# Patient Record
Sex: Male | Born: 1965 | Race: Black or African American | Hispanic: No | Marital: Single | Smoking: Never smoker
Health system: Southern US, Community
[De-identification: ages and names within clinical notes are randomized; demographics above are authoritative.]

## PROBLEM LIST (undated history)

## (undated) ENCOUNTER — Emergency Department (HOSPITAL_COMMUNITY): Payer: No Typology Code available for payment source | Source: Home / Self Care

## (undated) DIAGNOSIS — Z9109 Other allergy status, other than to drugs and biological substances: Secondary | ICD-10-CM

## (undated) DIAGNOSIS — E7800 Pure hypercholesterolemia, unspecified: Secondary | ICD-10-CM

## (undated) DIAGNOSIS — I10 Essential (primary) hypertension: Secondary | ICD-10-CM

## (undated) DIAGNOSIS — E119 Type 2 diabetes mellitus without complications: Secondary | ICD-10-CM

## (undated) DIAGNOSIS — C801 Malignant (primary) neoplasm, unspecified: Secondary | ICD-10-CM

## (undated) DIAGNOSIS — K259 Gastric ulcer, unspecified as acute or chronic, without hemorrhage or perforation: Secondary | ICD-10-CM

## (undated) HISTORY — PX: COLON RESECTION: SHX5231

## (undated) HISTORY — PX: SPINE SURGERY: SHX786

## (undated) HISTORY — PX: HAND SURGERY: SHX662

## (undated) HISTORY — PX: CERVICAL LAMINECTOMY: SHX94

## (undated) HISTORY — PX: ACHILLES TENDON SURGERY: SHX542

## (undated) HISTORY — PX: SHOULDER ARTHROSCOPY: SHX128

---

## 2001-08-26 ENCOUNTER — Emergency Department (HOSPITAL_COMMUNITY): Admission: EM | Admit: 2001-08-26 | Discharge: 2001-08-26 | Payer: Self-pay | Admitting: Emergency Medicine

## 2004-07-10 ENCOUNTER — Emergency Department (HOSPITAL_COMMUNITY): Admission: EM | Admit: 2004-07-10 | Discharge: 2004-07-10 | Payer: Self-pay | Admitting: *Deleted

## 2005-05-25 ENCOUNTER — Emergency Department (HOSPITAL_COMMUNITY): Admission: EM | Admit: 2005-05-25 | Discharge: 2005-05-25 | Payer: Self-pay | Admitting: Emergency Medicine

## 2005-06-06 ENCOUNTER — Emergency Department (HOSPITAL_COMMUNITY): Admission: EM | Admit: 2005-06-06 | Discharge: 2005-06-06 | Payer: Self-pay | Admitting: Emergency Medicine

## 2005-06-18 ENCOUNTER — Ambulatory Visit: Payer: Self-pay | Admitting: Family Medicine

## 2005-06-20 ENCOUNTER — Ambulatory Visit (HOSPITAL_COMMUNITY): Admission: RE | Admit: 2005-06-20 | Discharge: 2005-06-20 | Payer: Self-pay | Admitting: Family Medicine

## 2005-06-24 ENCOUNTER — Encounter (HOSPITAL_COMMUNITY): Admission: RE | Admit: 2005-06-24 | Discharge: 2005-07-24 | Payer: Self-pay | Admitting: Family Medicine

## 2005-07-23 ENCOUNTER — Ambulatory Visit: Payer: Self-pay | Admitting: Orthopedic Surgery

## 2005-07-25 ENCOUNTER — Encounter (HOSPITAL_COMMUNITY): Admission: RE | Admit: 2005-07-25 | Discharge: 2005-08-24 | Payer: Self-pay | Admitting: Family Medicine

## 2005-08-12 ENCOUNTER — Ambulatory Visit: Payer: Self-pay | Admitting: Family Medicine

## 2005-08-25 ENCOUNTER — Encounter (HOSPITAL_COMMUNITY): Admission: RE | Admit: 2005-08-25 | Discharge: 2005-09-24 | Payer: Self-pay | Admitting: Family Medicine

## 2005-10-24 ENCOUNTER — Ambulatory Visit: Payer: Self-pay | Admitting: Family Medicine

## 2005-10-30 ENCOUNTER — Encounter (HOSPITAL_COMMUNITY): Admission: RE | Admit: 2005-10-30 | Discharge: 2005-11-29 | Payer: Self-pay | Admitting: Neurosurgery

## 2005-11-26 ENCOUNTER — Ambulatory Visit: Payer: Self-pay | Admitting: Orthopedic Surgery

## 2005-12-03 ENCOUNTER — Encounter (HOSPITAL_COMMUNITY): Admission: RE | Admit: 2005-12-03 | Discharge: 2005-12-06 | Payer: Self-pay | Admitting: Neurosurgery

## 2005-12-09 ENCOUNTER — Encounter (HOSPITAL_COMMUNITY): Admission: RE | Admit: 2005-12-09 | Discharge: 2006-01-08 | Payer: Self-pay | Admitting: Neurosurgery

## 2006-01-07 ENCOUNTER — Ambulatory Visit: Payer: Self-pay | Admitting: Orthopedic Surgery

## 2006-01-22 ENCOUNTER — Encounter (HOSPITAL_COMMUNITY): Admission: RE | Admit: 2006-01-22 | Discharge: 2006-02-21 | Payer: Self-pay | Admitting: Orthopedic Surgery

## 2006-03-05 ENCOUNTER — Encounter (HOSPITAL_COMMUNITY): Admission: RE | Admit: 2006-03-05 | Discharge: 2006-03-09 | Payer: Self-pay | Admitting: Orthopedic Surgery

## 2006-03-11 ENCOUNTER — Encounter (HOSPITAL_COMMUNITY): Admission: RE | Admit: 2006-03-11 | Discharge: 2006-04-10 | Payer: Self-pay | Admitting: Orthopedic Surgery

## 2006-04-14 ENCOUNTER — Encounter (HOSPITAL_COMMUNITY): Admission: RE | Admit: 2006-04-14 | Discharge: 2006-05-14 | Payer: Self-pay | Admitting: Orthopedic Surgery

## 2006-07-23 ENCOUNTER — Ambulatory Visit (HOSPITAL_COMMUNITY): Admission: RE | Admit: 2006-07-23 | Discharge: 2006-07-23 | Payer: Self-pay | Admitting: Neurosurgery

## 2006-09-16 ENCOUNTER — Inpatient Hospital Stay (HOSPITAL_COMMUNITY): Admission: RE | Admit: 2006-09-16 | Discharge: 2006-09-17 | Payer: Self-pay | Admitting: Neurosurgery

## 2006-11-14 ENCOUNTER — Emergency Department (HOSPITAL_COMMUNITY): Admission: EM | Admit: 2006-11-14 | Discharge: 2006-11-14 | Payer: Self-pay | Admitting: Emergency Medicine

## 2006-11-17 ENCOUNTER — Encounter (HOSPITAL_COMMUNITY): Admission: RE | Admit: 2006-11-17 | Discharge: 2006-12-08 | Payer: Self-pay | Admitting: Neurosurgery

## 2006-11-28 ENCOUNTER — Emergency Department (HOSPITAL_COMMUNITY): Admission: EM | Admit: 2006-11-28 | Discharge: 2006-11-28 | Payer: Self-pay | Admitting: Emergency Medicine

## 2006-12-28 ENCOUNTER — Encounter: Payer: Self-pay | Admitting: Orthopedic Surgery

## 2007-05-17 ENCOUNTER — Ambulatory Visit: Payer: Self-pay | Admitting: Family Medicine

## 2007-06-28 ENCOUNTER — Encounter: Payer: Self-pay | Admitting: Orthopedic Surgery

## 2007-06-29 ENCOUNTER — Encounter: Payer: Self-pay | Admitting: Orthopedic Surgery

## 2007-07-07 ENCOUNTER — Ambulatory Visit: Payer: Self-pay | Admitting: Family Medicine

## 2008-02-18 ENCOUNTER — Encounter: Payer: Self-pay | Admitting: Family Medicine

## 2008-08-14 ENCOUNTER — Ambulatory Visit: Payer: Self-pay | Admitting: Family Medicine

## 2008-08-14 DIAGNOSIS — M542 Cervicalgia: Secondary | ICD-10-CM | POA: Insufficient documentation

## 2008-09-11 ENCOUNTER — Emergency Department (HOSPITAL_COMMUNITY): Admission: EM | Admit: 2008-09-11 | Discharge: 2008-09-11 | Payer: Self-pay | Admitting: Emergency Medicine

## 2008-09-18 ENCOUNTER — Ambulatory Visit: Payer: Self-pay | Admitting: Family Medicine

## 2008-10-10 ENCOUNTER — Encounter: Payer: Self-pay | Admitting: Orthopedic Surgery

## 2008-10-10 ENCOUNTER — Encounter: Payer: Self-pay | Admitting: Family Medicine

## 2008-10-13 ENCOUNTER — Encounter: Payer: Self-pay | Admitting: Family Medicine

## 2008-11-22 ENCOUNTER — Ambulatory Visit: Payer: Self-pay | Admitting: Orthopedic Surgery

## 2008-11-22 DIAGNOSIS — M65849 Other synovitis and tenosynovitis, unspecified hand: Secondary | ICD-10-CM

## 2008-11-22 DIAGNOSIS — G589 Mononeuropathy, unspecified: Secondary | ICD-10-CM | POA: Insufficient documentation

## 2008-11-22 DIAGNOSIS — M5412 Radiculopathy, cervical region: Secondary | ICD-10-CM | POA: Insufficient documentation

## 2008-11-22 DIAGNOSIS — M65839 Other synovitis and tenosynovitis, unspecified forearm: Secondary | ICD-10-CM

## 2008-11-23 ENCOUNTER — Telehealth: Payer: Self-pay | Admitting: Orthopedic Surgery

## 2008-11-27 ENCOUNTER — Ambulatory Visit (HOSPITAL_COMMUNITY): Admission: RE | Admit: 2008-11-27 | Discharge: 2008-11-27 | Payer: Self-pay | Admitting: Orthopedic Surgery

## 2008-11-27 ENCOUNTER — Encounter: Payer: Self-pay | Admitting: Orthopedic Surgery

## 2009-01-09 ENCOUNTER — Encounter: Payer: Self-pay | Admitting: Family Medicine

## 2009-02-07 ENCOUNTER — Encounter: Payer: Self-pay | Admitting: Orthopedic Surgery

## 2009-02-09 ENCOUNTER — Encounter: Payer: Self-pay | Admitting: Family Medicine

## 2009-02-14 ENCOUNTER — Encounter (INDEPENDENT_AMBULATORY_CARE_PROVIDER_SITE_OTHER): Payer: Self-pay | Admitting: *Deleted

## 2009-02-14 ENCOUNTER — Telehealth: Payer: Self-pay | Admitting: Orthopedic Surgery

## 2009-02-14 ENCOUNTER — Encounter: Payer: Self-pay | Admitting: Orthopedic Surgery

## 2009-02-14 DIAGNOSIS — Z8669 Personal history of other diseases of the nervous system and sense organs: Secondary | ICD-10-CM | POA: Insufficient documentation

## 2009-02-15 ENCOUNTER — Telehealth: Payer: Self-pay | Admitting: Orthopedic Surgery

## 2010-04-11 NOTE — Consult Note (Signed)
Summary: [facility] consult Dr Letitia Libra consult Dr Franky Macho   Imported By: Cammie Sickle 06/30/2009 12:52:50  _____________________________________________________________________  External Attachment:    Type:   Image     Comment:   External Document

## 2010-04-11 NOTE — Letter (Signed)
Summary: Medical record request Attorney Hillery Jacks  Medical record request Attorney Hillery Jacks   Imported By: Cammie Sickle 04/18/2009 20:10:16  _____________________________________________________________________  External Attachment:    Type:   Image     Comment:   External Document

## 2010-07-23 NOTE — Op Note (Signed)
Joseph Pruitt, Joseph Pruitt                 ACCOUNT NO.:  [id number]   MEDICAL RECORD NO.:  [id number]          PATIENT TYPE:  INP   LOCATION:  5152                         FACILITY:  [facility]   PHYSICIAN:  Coletta Memos, M.D.     DATE OF BIRTH:  December 20, 1965   DATE OF PROCEDURE:  09/16/2006  DATE OF DISCHARGE:                               OPERATIVE REPORT   PREOPERATIVE DIAGNOSIS:  Cervical spondylosis, cervical radiculopathy C5-  6, C6-7 and neck pain.   POSTOPERATIVE DIAGNOSIS:  Cervical spondylosis, cervical radiculopathy  C5-6, C6-7 and neck pain.   PROCEDURE:  1. Anterior cervical decompression C5-6, C6-7.  2. Arthrodesis C5-6, C6-7 with 7-mm structural allograft x 2.  3. Anterior instrumentation 34 mm vector plate with 14 mm screws.   COMPLICATIONS:  None.   SURGEON:  Coletta Memos, M.D.   ASSISTANT:  Payton Doughty, M.D.   INDICATIONS:  Mr. Joseph Pruitt is a gentleman whom I have taken care of  now for approximately a year.  He presented initially August 2007 with  neck and shoulder pain.  At that time he had a herniated disk on the  left side at C5-6.  He was able to do well with conservative treatment.  He reappeared at the office on 07/16/2006 and at that time he had  significant pain in the left side of his neck which had gotten worse  over the last few weeks.  And this was not the same pain which he had  been having when he first saw me.  MRI showed significant amount of soft  tissue and an osteophyte at C5-6 on the left side, foraminal narrowing,  canal narrowing at 5-6 and 6-7.  Cord signal was normal.  I therefore  recommended he agreed to undergo operative decompression and  arthrodesis.   OPERATIVE NOTE:  Mr. Joseph Pruitt was brought to the operating room intubated  and placed under a general anesthetic without difficulty.  His head was  positioned on horseshoe headrest essentially in neutral position.  His  neck was prepped and he was draped in a sterile fashion.  I infiltrated  4  mL of half percent lidocaine 1:200,000 strength epinephrine starting  from the midline extending to the medial border of the left  sternocleidomastoid at the level of the cricoid cartilage.  I opened the  skin with a #10 blade and I took my initial incision down to the level  of the platysma.  Using Metzenbaum scissors, I dissected rostrally and  caudally in a plane above the platysma.  I then opened the platysma  horizontal fashion using the same Metzenbaum scissors.  I then dissected  rostrally and caudally in a plane inferior to the platysma.  Sternocleidomastoid easily visible and the medial strap muscles also.  I  then with Metzenbaum scissors and using both blunt and sharp dissection  created an avascular plane through the sternocleidomastoid strap  muscles.  The carotid artery was retracted laterally strap muscles and  esophagus retracted medially.  I placed a spinal needle which was on x-  ray shown to be at C5-6.  Using that as a guide I then reflected the  longus colli muscles bilaterally, placed a self-retaining retractor and  proceeded to open the disk spaces at both C5-6 and C6-7.  I did some  initial curetting of both disk spaces and removed disk material.  At  this point I then placed distraction pins one at C6, the other at C7 and  distracted the space.  I then proceeded with the diskectomy and  decompression.   I performed the diskectomy and decompression of the spinal canal at C6-7  using curettes, high-speed drill and Kerrison punches along with  pituitary rongeurs.  He had very thickened posterior longitudinal  ligament and osteophytes present.  The drill was able to remove  osteophytes and I used a Kerrison punch to fully decompressed both C7  nerve roots along with the spinal canal.  With that being done I then  turned my attention to the arthrodesis.  I used a drill to again remove  endplate and soft tissue from the bony surfaces.  I excised the space  and placed a  7-mm structural allograft into the space.  I then removed  the distraction pin at C7 and placed that at C5.   I performed my decompression of the spinal canal and C6 nerve roots at  C5-6 using the same technique.  I used curettes, a high-speed drill,  Kerrison punches and pituitary rongeurs.  He had a very large osteophyte  at C5-6 coming off the inferior portion of the C5 vertebral body.  He  also had a great deal of soft tissue and disk material on the left-sided  neural foramen which I removed.  I was able to fully decompress the C6  nerve roots bilaterally.  Once that was done I again prepared for  arthrodesis.  I placed a 7 mm graft after using the drill to remove soft  tissue from the endplates at C5 and at C6.  There was free egress of  both nerve roots laterally.  After placing the graft, I prepared for  anterior instrumentation.   With Dr. Temple Pacini assistance, I placed a 34 mm plate, putting two screws  into C5, two in C6, two in C7.  They were  self-tapping screws.  X-ray  showed the plate, plugs and screws to all be in good position.  I then  irrigated the wound.  I cauterized some small bleeding areas.  With  hemostasis, I then closed using Vicryl sutures to reapproximate the  platysma and subcuticular layers.  Dermabond used for sterile dressing.           ______________________________  Coletta Memos, M.D.     KC/MEDQ  D:  09/16/2006  T:  09/17/2006  Job:  [id number]

## 2010-08-27 ENCOUNTER — Encounter: Payer: Self-pay | Admitting: Family Medicine

## 2010-08-29 ENCOUNTER — Ambulatory Visit: Payer: Self-pay | Admitting: Family Medicine

## 2010-08-29 ENCOUNTER — Encounter: Payer: Self-pay | Admitting: Family Medicine

## 2010-12-04 DIAGNOSIS — M6788 Other specified disorders of synovium and tendon, other site: Secondary | ICD-10-CM | POA: Insufficient documentation

## 2010-12-24 LAB — BASIC METABOLIC PANEL
BUN: 8
Calcium: 9.2
Chloride: 102
GFR calc Af Amer: >60
Glucose, Bld: 104 — ABNORMAL HIGH
Potassium: 4.6
Sodium: 137

## 2010-12-24 LAB — CBC
HCT: 43.7
MCHC: 33.8
MCV: 91.8
Platelets: 191
RDW: 13.2

## 2011-01-06 ENCOUNTER — Emergency Department (HOSPITAL_COMMUNITY)
Admission: EM | Admit: 2011-01-06 | Discharge: 2011-01-06 | Disposition: A | Discharge status: Home / Self Care | Payer: Medicare Other | Attending: Emergency Medicine | Admitting: Emergency Medicine

## 2011-01-06 ENCOUNTER — Encounter: Payer: Self-pay | Admitting: *Deleted

## 2011-01-06 DIAGNOSIS — L259 Unspecified contact dermatitis, unspecified cause: Secondary | ICD-10-CM

## 2011-01-06 MED ORDER — PREDNISONE 10 MG PO TABS: 20.0000 mg | ORAL_TABLET | Freq: Every day | ORAL | Status: AC

## 2011-01-06 MED ORDER — METHYLPREDNISOLONE SODIUM SUCC 125 MG IJ SOLR
125.0000 mg | Freq: Once | INTRAMUSCULAR | Status: AC
  Administered 2011-01-06: 125 mg via INTRAMUSCULAR
  Filled 2011-01-06 (×2): qty 2

## 2011-01-06 NOTE — ED Notes (Signed)
Pt reports was recently taking down a fence and may have come in contact w/ poison oak - pt w/ hx of same, has not self treated at home, states "I usually just come to the hospital and they give me a shot." Pt denies shortness of breath, swelling, or difficulty swallowing. Breath sounds clear and equal bilat.

## 2011-01-06 NOTE — ED Notes (Signed)
Rx given x1 D/c instructions reviewed w/ pt - pt denies any further questions or concerns at present.   

## 2011-01-06 NOTE — ED Provider Notes (Signed)
History     CSN: [id number] Arrival date & time: 01/06/2011  2:32 AM   First MD Initiated Contact with Patient 01/06/11 0246      Chief Complaint  Patient presents with  . Rash    (Consider location/radiation/quality/duration/timing/severity/associated sxs/prior treatment) HPI Comments: Helping take down a fence, got into poison oak.  Rash and itching to arms and legs ever since.  Patient is a 45 y.o. male presenting with rash. The history is provided by the patient.  Rash  This is a new problem. The current episode started 12 to 24 hours ago. The problem is associated with plant contact. There has been no fever. The patient is experiencing no pain. The pain has been constant since onset. Associated symptoms include itching.    History reviewed. No pertinent past medical history.  Past Surgical History  Procedure Date  . Spine surgery   . Hand surgery     No family history on file.  History  Substance Use Topics  . Smoking status: Never Smoker   . Smokeless tobacco: Not on file  . Alcohol Use: No      Review of Systems  Skin: Positive for itching and rash.  All other systems reviewed and are negative.    Allergies  Review of patient's allergies indicates no known allergies.  Home Medications   Current Outpatient Rx  Name Route Sig Dispense Refill  . OXYCODONE-ACETAMINOPHEN 5-325 MG PO TABS Oral Take 1 tablet by mouth every 6 (six) hours as needed.        BP 136/93  Pulse 61  Temp(Src) 97.8 F (36.6 C) (Oral)  Resp 16  Ht 6\' 1"  (1.854 m)  Wt 249 lb (112.946 kg)  BMI 32.85 kg/m2  SpO2 98%  Physical Exam  Nursing note and vitals reviewed. Constitutional: He is oriented to person, place, and time. He appears well-developed and well-nourished. No distress.  HENT:  Head: Normocephalic and atraumatic.  Neck: Normal range of motion. Neck supple.  Neurological: He is alert and oriented to person, place, and time.  Skin: He is not diaphoretic.   There is a macular rash to the forearms and lower legs.      ED Course  Procedures (including critical care time)  Labs Reviewed - No data to display No results found.   No diagnosis found.    MDM          Geoffery Lyons, MD 01/06/11 [phone]

## 2011-01-06 NOTE — ED Notes (Signed)
Pt reports exposure to poison oak on Sat.  Reports itching on hands, arms and legs.

## 2011-10-21 ENCOUNTER — Emergency Department (HOSPITAL_COMMUNITY): Payer: Medicare Other

## 2011-10-21 ENCOUNTER — Emergency Department (HOSPITAL_COMMUNITY)
Admission: EM | Admit: 2011-10-21 | Discharge: 2011-10-21 | Disposition: A | Discharge status: Home / Self Care | Payer: Medicare Other | Attending: Emergency Medicine | Admitting: Emergency Medicine

## 2011-10-21 ENCOUNTER — Encounter (HOSPITAL_COMMUNITY): Payer: Self-pay

## 2011-10-21 DIAGNOSIS — R109 Unspecified abdominal pain: Secondary | ICD-10-CM

## 2011-10-21 DIAGNOSIS — R197 Diarrhea, unspecified: Secondary | ICD-10-CM | POA: Insufficient documentation

## 2011-10-21 DIAGNOSIS — R1033 Periumbilical pain: Secondary | ICD-10-CM | POA: Insufficient documentation

## 2011-10-21 LAB — BASIC METABOLIC PANEL
BUN: 12 mg/dL (ref 6–23)
CO2: 28 mEq/L (ref 19–32)
Calcium: 9.5 mg/dL (ref 8.4–10.5)
GFR calc Af Amer: >90 mL/min (ref >90)
GFR calc non Af Amer: 87 mL/min — ABNORMAL LOW (ref >90)
Glucose, Bld: 113 mg/dL — ABNORMAL HIGH (ref 70–99)
Potassium: 3.8 mEq/L (ref 3.5–5.1)
Sodium: 136 mEq/L (ref 135–145)

## 2011-10-21 LAB — URINALYSIS, ROUTINE W REFLEX MICROSCOPIC
Bilirubin Urine: NEGATIVE
Ketones, ur: NEGATIVE mg/dL
Leukocytes, UA: NEGATIVE
Nitrite: NEGATIVE
Protein, ur: NEGATIVE mg/dL
Specific Gravity, Urine: 1.01 (ref 1.005–1.030)
Urobilinogen, UA: 0.2 mg/dL (ref 0.0–1.0)

## 2011-10-21 LAB — HEPATIC FUNCTION PANEL
AST: 23 U/L (ref 0–37)
Alkaline Phosphatase: 112 U/L (ref 39–117)
Bilirubin, Direct: 0.2 mg/dL (ref 0.0–0.3)
Total Bilirubin: 1 mg/dL (ref 0.3–1.2)
Total Protein: 7.8 g/dL (ref 6.0–8.3)

## 2011-10-21 LAB — LIPASE, BLOOD: Lipase: 25 U/L (ref 11–59)

## 2011-10-21 LAB — CBC WITH DIFFERENTIAL/PLATELET
Basophils Absolute: 0 10*3/uL (ref 0.0–0.1)
HCT: 42.3 % (ref 39.0–52.0)
Hemoglobin: 14.8 g/dL (ref 13.0–17.0)
MCH: 31.5 pg (ref 26.0–34.0)
MCV: 90 fL (ref 78.0–100.0)

## 2011-10-21 MED ORDER — PANTOPRAZOLE SODIUM 20 MG PO TBEC: 20.0000 mg | DELAYED_RELEASE_TABLET | Freq: Every day | ORAL | Status: DC

## 2011-10-21 MED ORDER — HYDROMORPHONE HCL PF 1 MG/ML IJ SOLN
1.0000 mg | Freq: Once | INTRAMUSCULAR | Status: AC
  Administered 2011-10-21: 1 mg via INTRAVENOUS
  Filled 2011-10-21: qty 1

## 2011-10-21 MED ORDER — PANTOPRAZOLE SODIUM 40 MG IV SOLR
40.0000 mg | Freq: Once | INTRAVENOUS | Status: AC
  Administered 2011-10-21: 40 mg via INTRAVENOUS
  Filled 2011-10-21: qty 40

## 2011-10-21 MED ORDER — HYDROCODONE-ACETAMINOPHEN 5-325 MG PO TABS: 1.0000 | ORAL_TABLET | Freq: Four times a day (QID) | ORAL | Status: AC | PRN

## 2011-10-21 MED ORDER — ONDANSETRON HCL 4 MG/2ML IJ SOLN
4.0000 mg | Freq: Once | INTRAMUSCULAR | Status: AC
  Administered 2011-10-21: 4 mg via INTRAVENOUS
  Filled 2011-10-21: qty 2

## 2011-10-21 MED ORDER — IOHEXOL 300 MG/ML  SOLN
100.0000 mL | Freq: Once | INTRAMUSCULAR | Status: AC | PRN
  Administered 2011-10-21: 100 mL via INTRAVENOUS

## 2011-10-21 NOTE — ED Notes (Signed)
Pt c/o abd pain around umbilicus region since yesterday evening.  Denies n/v, reports diarrhea this morning.

## 2011-10-21 NOTE — ED Provider Notes (Signed)
History   This chart was scribed for Joseph Lennert, MD by Charolett Bumpers . The patient was seen in room APA04/APA04. Patient's care was started at 0805.    CSN: [id number]  Arrival date & time 10/21/11  4098   First MD Initiated Contact with Patient 10/21/11 0805      Chief Complaint  Patient presents with  . Abdominal Pain    (Consider location/radiation/quality/duration/timing/severity/associated sxs/prior treatment) HPI Comments: Joseph Pruitt is a 46 y.o. male who presents to the Emergency Department complaining of constant, moderate periumbilical pain with a sudden onset of yesterday evening. Pt reports associated diarrhea that started this morning. PT denies n/v, fever and chills. Pt reports a h/o similar pain over the past couple of years with an unknown origin. Pt states that he has cystoscopy, with normal results 2 weeks ago at [facility]. Pt reports that the cystoscopy was preformed due to persistent hematuria. Pt denies any h/o abdominal surgery.   Patient is a 46 y.o. male presenting with abdominal pain. The history is provided by the patient.  Abdominal Pain The primary symptoms of the illness include abdominal pain and diarrhea. The primary symptoms of the illness do not include fever, fatigue, nausea or vomiting. The current episode started yesterday. The onset of the illness was sudden. The problem has been gradually worsening.  The abdominal pain is located in the periumbilical region.  Symptoms associated with the illness do not include chills, hematuria, frequency or back pain.     History reviewed. No pertinent past medical history.  Past Surgical History  Procedure Date  . Spine surgery   . Hand surgery     No family history on file.  History  Substance Use Topics  . Smoking status: Never Smoker   . Smokeless tobacco: Not on file  . Alcohol Use: No      Review of Systems  Constitutional: Negative for fever, chills and fatigue.  HENT:  Negative for congestion, sinus pressure and ear discharge.   Eyes: Negative for discharge.  Respiratory: Negative for cough.   Cardiovascular: Negative for chest pain.  Gastrointestinal: Positive for abdominal pain and diarrhea. Negative for nausea and vomiting.  Genitourinary: Negative for frequency and hematuria.  Musculoskeletal: Negative for back pain.  Skin: Negative for rash.  Neurological: Negative for seizures and headaches.  Hematological: Negative.   Psychiatric/Behavioral: Negative for hallucinations.  All other systems reviewed and are negative.    Allergies  Review of patient's allergies indicates no known allergies.  Home Medications   Current Outpatient Rx  Name Route Sig Dispense Refill  . OXYCODONE-ACETAMINOPHEN 5-325 MG PO TABS Oral Take 1 tablet by mouth every 6 (six) hours as needed. Pain      BP 140/94  Pulse 64  Temp 97.8 F (36.6 C) (Oral)  Resp 20  Ht 6\' 1"  (1.854 m)  Wt 246 lb (111.585 kg)  BMI 32.46 kg/m2  SpO2 93%  Physical Exam  Constitutional: He is oriented to person, place, and time. He appears well-developed.  HENT:  Head: Normocephalic and atraumatic.  Mouth/Throat: Oropharynx is clear and moist.  Eyes: Conjunctivae and EOM are normal. No scleral icterus.  Neck: Neck supple. No thyromegaly present.  Cardiovascular: Normal rate, regular rhythm and normal heart sounds.  Exam reveals no gallop and no friction rub.   No murmur heard. Pulmonary/Chest: Effort normal and breath sounds normal. No stridor. He has no wheezes. He has no rales. He exhibits no tenderness.  Abdominal: Soft. Bowel sounds  are normal. He exhibits no distension. There is tenderness in the periumbilical area. There is no rebound.       Moderate periumbilical tenderness.   Musculoskeletal: Normal range of motion. He exhibits no edema.  Lymphadenopathy:    He has no cervical adenopathy.  Neurological: He is oriented to person, place, and time. Coordination normal.  Skin:  No rash noted. No erythema.  Psychiatric: He has a normal mood and affect. His behavior is normal.    ED Course  Procedures (including critical care time)  DIAGNOSTIC STUDIES: Oxygen Saturation is 96% on room air, adequate by my interpretation.    COORDINATION OF CARE:   08:11-Discussed planned course of treatment with the patient including UA and blood work, who is agreeable at this time.   08:30-Medication Orders: Ondansetron (Zofran) injection 4 mg-once; Hydromorphone (Dilaudid) injection 1 mg-once; Pantoprazole (Protonix) injection 40 mg-once.   10:52-Recheck: Informed pt of imaging and lab results.   Results for orders placed during the hospital encounter of 10/21/11  CBC WITH DIFFERENTIAL      Component Value Range   WBC 4.3  4.0 - 10.5 K/uL   RBC 4.70  4.22 - 5.81 MIL/uL   Hemoglobin 14.8  13.0 - 17.0 g/dL   HCT 40.9  81.1 - 91.4 %   MCV 90.0  78.0 - 100.0 fL   MCH 31.5  26.0 - 34.0 pg   MCHC 35.0  30.0 - 36.0 g/dL   RDW 78.2  95.6 - 21.3 %   Platelets 145 (*) 150 - 400 K/uL   Neutrophils Relative 49  43 - 77 %   Neutro Abs 2.1  1.7 - 7.7 K/uL   Lymphocytes Relative 38  12 - 46 %   Lymphs Abs 1.6  0.7 - 4.0 K/uL   Monocytes Relative 10  3 - 12 %   Monocytes Absolute 0.4  0.1 - 1.0 K/uL   Eosinophils Relative 3  0 - 5 %   Eosinophils Absolute 0.1  0.0 - 0.7 K/uL   Basophils Relative 0  0 - 1 %   Basophils Absolute 0.0  0.0 - 0.1 K/uL  BASIC METABOLIC PANEL      Component Value Range   Sodium 136  135 - 145 mEq/L   Potassium 3.8  3.5 - 5.1 mEq/L   Chloride 100  96 - 112 mEq/L   CO2 28  19 - 32 mEq/L   Glucose, Bld 113 (*) 70 - 99 mg/dL   BUN 12  6 - 23 mg/dL   Creatinine, Ser 0.86  0.50 - 1.35 mg/dL   Calcium 9.5  8.4 - 57.8 mg/dL   GFR calc non Af Amer 87 (*) >90 mL/min   GFR calc Af Amer >90  >90 mL/min  URINALYSIS, ROUTINE W REFLEX MICROSCOPIC      Component Value Range   Color, Urine YELLOW  YELLOW   APPearance CLEAR  CLEAR   Specific Gravity, Urine  1.010  1.005 - 1.030   pH 7.0  5.0 - 8.0   Glucose, UA NEGATIVE  NEGATIVE mg/dL   Hgb urine dipstick NEGATIVE  NEGATIVE   Bilirubin Urine NEGATIVE  NEGATIVE   Ketones, ur NEGATIVE  NEGATIVE mg/dL   Protein, ur NEGATIVE  NEGATIVE mg/dL   Urobilinogen, UA 0.2  0.0 - 1.0 mg/dL   Nitrite NEGATIVE  NEGATIVE   Leukocytes, UA NEGATIVE  NEGATIVE  LIPASE, BLOOD      Component Value Range   Lipase 25  11 - 59  U/L  HEPATIC FUNCTION PANEL      Component Value Range   Total Protein 7.8  6.0 - 8.3 g/dL   Albumin 3.7  3.5 - 5.2 g/dL   AST 23  0 - 37 U/L   ALT 25  0 - 53 U/L   Alkaline Phosphatase 112  39 - 117 U/L   Total Bilirubin 1.0  0.3 - 1.2 mg/dL   Bilirubin, Direct 0.2  0.0 - 0.3 mg/dL   Indirect Bilirubin 0.8  0.3 - 0.9 mg/dL     Ct Abdomen Pelvis W Contrast  10/21/2011  *RADIOLOGY REPORT*  Clinical Data: Periumbilical pain since yesterday.  Diarrhea. History of cystoscopy 2 weeks ago at outside location.  CT ABDOMEN AND PELVIS WITH CONTRAST  Technique:  Multidetector CT imaging of the abdomen and pelvis was performed following the standard protocol during bolus administration of intravenous contrast.  Contrast: OMNIPAQUE IOHEXOL 300 MG/ML  SOLN  Comparison: None.  Findings: Patchy bibasilar subsegmental atelectasis.  Mild cardiomegaly, without pericardial or pleural effusion.  Contrast within the lower thoracic esophagus.  Normal liver, spleen, stomach, pancreas, gallbladder, biliary tract, adrenal glands, kidneys. No retroperitoneal or retrocrural adenopathy.  Multifocal areas of colonic underdistension, without definite wall thickening.  Normal terminal ileum and appendix.  Appendix normal on image 69.  Distal small bowel loops are mildly prominent, measuring up to 2.9 cm.  There is subtle wall thickening and surrounding edema suspected.  No ascites.  No pelvic adenopathy.    Normal urinary bladder and prostate.  No significant free fluid.  Congenitally short lumbar pedicles.   IMPRESSION:  1.  Normal appendix. 2.  Mild prominence of the distal small bowel loops with subtle/equivocal wall thickening and surrounding edema.  Cannot exclude mild enteritis, most likely infectious. 3.  Contrast within the lower thoracic esophagus suggests gastroesophageal reflux disease or esophageal dysmotility.  Original Report Authenticated By: Consuello Bossier, M.D.     No diagnosis found.  Pt  Improved with tx  MDM  gerd tx  The chart was scribed for me under my direct supervision.  I personally performed the history, physical, and medical decision making and all procedures in the evaluation of this patient.Joseph Lennert, MD 10/21/11 1057

## 2011-11-11 ENCOUNTER — Encounter (HOSPITAL_COMMUNITY): Payer: Self-pay

## 2011-11-11 ENCOUNTER — Emergency Department (HOSPITAL_COMMUNITY)
Admission: EM | Admit: 2011-11-11 | Discharge: 2011-11-11 | Disposition: A | Discharge status: Home / Self Care | Payer: Medicare Other | Attending: Emergency Medicine | Admitting: Emergency Medicine

## 2011-11-11 ENCOUNTER — Emergency Department (HOSPITAL_COMMUNITY): Payer: Medicare Other

## 2011-11-11 DIAGNOSIS — M79609 Pain in unspecified limb: Secondary | ICD-10-CM | POA: Insufficient documentation

## 2011-11-11 DIAGNOSIS — M25559 Pain in unspecified hip: Secondary | ICD-10-CM | POA: Insufficient documentation

## 2011-11-11 MED ORDER — OXYCODONE-ACETAMINOPHEN 5-325 MG PO TABS: 1.0000 | ORAL_TABLET | ORAL | Status: AC | PRN

## 2011-11-11 MED ORDER — ONDANSETRON HCL 4 MG/2ML IJ SOLN: 4.0000 mg | Freq: Once | INTRAMUSCULAR | Status: DC

## 2011-11-11 MED ORDER — HYDROMORPHONE HCL PF 1 MG/ML IJ SOLN: 1.0000 mg | Freq: Once | INTRAMUSCULAR | Status: DC

## 2011-11-11 MED ORDER — SODIUM CHLORIDE 0.9 % IV SOLN: Freq: Once | INTRAVENOUS | Status: DC

## 2011-11-11 MED ORDER — ONDANSETRON 4 MG PO TBDP
4.0000 mg | ORAL_TABLET | Freq: Once | ORAL | Status: AC
  Administered 2011-11-11: 4 mg via ORAL
  Filled 2011-11-11: qty 1

## 2011-11-11 MED ORDER — IBUPROFEN 800 MG PO TABS
800.0000 mg | ORAL_TABLET | Freq: Once | ORAL | Status: AC
  Administered 2011-11-11: 800 mg via ORAL
  Filled 2011-11-11: qty 1

## 2011-11-11 MED ORDER — KETOROLAC TROMETHAMINE 30 MG/ML IJ SOLN: 30.0000 mg | Freq: Once | INTRAMUSCULAR | Status: DC

## 2011-11-11 MED ORDER — OXYCODONE-ACETAMINOPHEN 5-325 MG PO TABS
2.0000 | ORAL_TABLET | Freq: Once | ORAL | Status: AC
  Administered 2011-11-11: 2 via ORAL
  Filled 2011-11-11: qty 2
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
## 2011-12-02 ENCOUNTER — Emergency Department (HOSPITAL_COMMUNITY)
Admission: EM | Admit: 2011-12-02 | Discharge: 2011-12-03 | Disposition: A | Discharge status: Home / Self Care | Payer: Medicare Other | Attending: Emergency Medicine | Admitting: Emergency Medicine

## 2011-12-02 ENCOUNTER — Encounter (HOSPITAL_COMMUNITY): Payer: Self-pay | Admitting: Emergency Medicine

## 2011-12-02 ENCOUNTER — Emergency Department (HOSPITAL_COMMUNITY): Payer: Medicare Other

## 2011-12-02 DIAGNOSIS — M25539 Pain in unspecified wrist: Secondary | ICD-10-CM | POA: Insufficient documentation

## 2011-12-02 MED ORDER — TRAMADOL HCL 50 MG PO TABS
50.0000 mg | ORAL_TABLET | Freq: Once | ORAL | Status: AC
  Administered 2011-12-02: 50 mg via ORAL
  Filled 2011-12-02: qty 1

## 2011-12-02 NOTE — ED Notes (Signed)
States he is having pain and swelling in his left wrist, states he has been having pain and swelling since he injured it at the beginning of the month.  States he has poison oak all over and needs a shot, no rash noted to areas the patient points to, no active scratching.

## 2011-12-02 NOTE — ED Notes (Signed)
Left wrist pain, no new injury- pain worse since visit of 11/11/11.   Also requesting a shot of medication for "poison Lajoyce [place]" states he has it all over.

## 2011-12-03 MED ORDER — METHYLPREDNISOLONE SODIUM SUCC 125 MG IJ SOLR
125.0000 mg | Freq: Once | INTRAMUSCULAR | Status: AC
  Administered 2011-12-03: 125 mg via INTRAMUSCULAR
  Filled 2011-12-03: qty 2

## 2011-12-03 MED ORDER — TRAMADOL HCL 50 MG PO TABS: 50.0000 mg | ORAL_TABLET | Freq: Four times a day (QID) | ORAL | Status: DC | PRN
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
## 2011-12-23 ENCOUNTER — Emergency Department (HOSPITAL_COMMUNITY)
Admission: EM | Admit: 2011-12-23 | Discharge: 2011-12-23 | Disposition: A | Discharge status: Home / Self Care | Payer: Medicare Other | Attending: Emergency Medicine | Admitting: Emergency Medicine

## 2011-12-23 ENCOUNTER — Encounter (HOSPITAL_COMMUNITY): Payer: Self-pay

## 2011-12-23 DIAGNOSIS — R109 Unspecified abdominal pain: Secondary | ICD-10-CM

## 2011-12-23 DIAGNOSIS — Z8711 Personal history of peptic ulcer disease: Secondary | ICD-10-CM | POA: Insufficient documentation

## 2011-12-23 DIAGNOSIS — R1011 Right upper quadrant pain: Secondary | ICD-10-CM | POA: Insufficient documentation

## 2011-12-23 HISTORY — DX: Gastric ulcer, unspecified as acute or chronic, without hemorrhage or perforation: K25.9

## 2011-12-23 LAB — LIPASE, BLOOD: Lipase: 35 U/L (ref 11–59)

## 2011-12-23 LAB — CBC WITH DIFFERENTIAL/PLATELET
Eosinophils Absolute: 0.1 10*3/uL (ref 0.0–0.7)
Eosinophils Relative: 2 % (ref 0–5)
HCT: 43.4 % (ref 39.0–52.0)
Hemoglobin: 15.1 g/dL (ref 13.0–17.0)
Lymphs Abs: 2.1 10*3/uL (ref 0.7–4.0)
MCH: 31.1 pg (ref 26.0–34.0)
MCV: 89.5 fL (ref 78.0–100.0)
Monocytes Absolute: 0.4 10*3/uL (ref 0.1–1.0)
Monocytes Relative: 9 % (ref 3–12)
RBC: 4.85 MIL/uL (ref 4.22–5.81)

## 2011-12-23 LAB — COMPREHENSIVE METABOLIC PANEL
BUN: 15 mg/dL (ref 6–23)
Calcium: 9.4 mg/dL (ref 8.4–10.5)
GFR calc Af Amer: >90 mL/min (ref >90)
GFR calc non Af Amer: 86 mL/min — ABNORMAL LOW (ref >90)
Glucose, Bld: 104 mg/dL — ABNORMAL HIGH (ref 70–99)
Total Protein: 8 g/dL (ref 6.0–8.3)

## 2011-12-23 LAB — URINALYSIS, ROUTINE W REFLEX MICROSCOPIC
Bilirubin Urine: NEGATIVE
Hgb urine dipstick: NEGATIVE
Specific Gravity, Urine: 1.02 (ref 1.005–1.030)
Urobilinogen, UA: 0.2 mg/dL (ref 0.0–1.0)
pH: 7.5 (ref 5.0–8.0)

## 2011-12-23 MED ORDER — SODIUM CHLORIDE 0.9 % IV BOLUS (SEPSIS)
1000.0000 mL | Freq: Once | INTRAVENOUS | Status: AC
  Administered 2011-12-23: 1000 mL via INTRAVENOUS

## 2011-12-23 MED ORDER — ONDANSETRON HCL 4 MG/2ML IJ SOLN
4.0000 mg | Freq: Once | INTRAMUSCULAR | Status: AC
  Administered 2011-12-23: 4 mg via INTRAVENOUS
  Filled 2011-12-23: qty 2

## 2011-12-23 MED ORDER — OXYCODONE-ACETAMINOPHEN 5-325 MG PO TABS: 1.0000 | ORAL_TABLET | Freq: Four times a day (QID) | ORAL | Status: DC | PRN

## 2011-12-23 MED ORDER — PROMETHAZINE HCL 25 MG PO TABS: 25.0000 mg | ORAL_TABLET | Freq: Four times a day (QID) | ORAL | Status: DC | PRN

## 2011-12-23 MED ORDER — MORPHINE SULFATE 4 MG/ML IJ SOLN
4.0000 mg | Freq: Once | INTRAMUSCULAR | Status: AC
  Administered 2011-12-23: 4 mg via INTRAVENOUS
  Filled 2011-12-23: qty 1

## 2011-12-23 NOTE — ED Notes (Signed)
Notifed EDP of pt's restlessness and wanting to know what was going on

## 2011-12-23 NOTE — ED Notes (Signed)
Informed EDP pt wanted to speak with him.

## 2011-12-23 NOTE — ED Notes (Signed)
Pt c/o r sided abd pain for past few weeks but got worse yesterday.  Denies any n/v/d.  LBM was yesterday.  Reports when urinates his flow stops and starts.  Reports goes to [facility] for stomach ulcers.

## 2011-12-23 NOTE — ED Provider Notes (Signed)
History  This chart was scribed for Donnetta Hutching, MD by Ladona Ridgel Day. This patient was seen in room APA01/APA01 and the patient's care was started at 0855.   CSN: [id number]  Arrival date & time 12/23/11  7829   First MD Initiated Contact with Patient 12/23/11 1001      Chief Complaint  Patient presents with  . Abdominal Pain   Patient is a 46 y.o. male presenting with abdominal pain. The history is provided by the patient. No language interpreter was used.  Abdominal Pain The primary symptoms of the illness include abdominal pain. The primary symptoms of the illness do not include fever, shortness of breath, nausea or vomiting. The current episode started yesterday. The onset of the illness was gradual.  The patient has not had a change in bowel habit. Symptoms associated with the illness do not include chills, constipation or back pain.   Joseph Pruitt is a 46 y.o. male who presents to the Emergency Department complaining of intermittent gradually worsening RLQ abdominal worsened last PM but present over past couple of weeks. He denies any nausea, emesis, or decreased appetite. He has a normal BM yesterday.  He reports followed by [facility] for stomach ulcers and also had workup for urine that starts and stops when he is urinating; he also had normal colonoscopy performed there. He also states had an abdominal CT recently but is unsure of the results.   Past Medical History  Diagnosis Date  . Stomach ulcer     Past Surgical History  Procedure Date  . Spine surgery   . Hand surgery     No family history on file.  History  Substance Use Topics  . Smoking status: Never Smoker   . Smokeless tobacco: Not on file  . Alcohol Use: No      Review of Systems  Constitutional: Negative for fever and chills.  HENT: Negative for congestion.   Respiratory: Negative for shortness of breath.   Cardiovascular: Negative for chest pain.  Gastrointestinal: Positive for abdominal pain.  Negative for nausea, vomiting and constipation.  Genitourinary:       Urine flow stops and starts  Musculoskeletal: Negative for back pain.  Neurological: Negative for weakness.  All other systems reviewed and are negative.    Allergies  Review of patient's allergies indicates no known allergies.  Home Medications   Current Outpatient Rx  Name Route Sig Dispense Refill  . OXYCODONE-ACETAMINOPHEN 5-325 MG PO TABS Oral Take 1 tablet by mouth every 6 (six) hours as needed. Pain    . PANTOPRAZOLE SODIUM 20 MG PO TBEC Oral Take 1 tablet (20 mg total) by mouth daily. 30 tablet 1  . TRAMADOL HCL 50 MG PO TABS Oral Take 1 tablet (50 mg total) by mouth every 6 (six) hours as needed for pain. 15 tablet 0    Triage Vitals: BP 133/93  Pulse 60  Temp 97.9 F (36.6 C) (Oral)  Resp 18  Ht 6\' 1"  (1.854 m)  Wt 239 lb (108.41 kg)  BMI 31.53 kg/m2  SpO2 97%  Physical Exam  Nursing note and vitals reviewed. Constitutional: He is oriented to person, place, and time. He appears well-developed and well-nourished.  HENT:  Head: Normocephalic and atraumatic.  Eyes: Conjunctivae normal and EOM are normal. Pupils are equal, round, and reactive to light.  Neck: Normal range of motion. Neck supple.  Cardiovascular: Normal rate, regular rhythm and normal heart sounds.   Pulmonary/Chest: Effort normal and breath sounds normal.  Abdominal: Soft. Bowel sounds are normal. He exhibits no distension. There is tenderness (RLQ tenderness). There is no rebound and no guarding.  Musculoskeletal: Normal range of motion. He exhibits no tenderness.       No flank tenderness  Neurological: He is alert and oriented to person, place, and time.  Skin: Skin is warm and dry.  Psychiatric: He has a normal mood and affect.    ED Course  Procedures (including critical care time) DIAGNOSTIC STUDIES: Oxygen Saturation is 97% on room air, normal by my interpretation.    COORDINATION OF CARE: At 1020 AM Discussed  treatment plan with patient which includes reviewing CT scan from [facility], UA, IV fluids, pain/nausea medicine, and blood work. Patient agrees.   Labs Reviewed  CBC WITH DIFFERENTIAL - Abnormal; Notable for the following:    Neutrophils Relative 41 (*)     Lymphocytes Relative 48 (*)     All other components within normal limits  COMPREHENSIVE METABOLIC PANEL - Abnormal; Notable for the following:    Glucose, Bld 104 (*)     GFR calc non Af Amer 86 (*)     All other components within normal limits  URINALYSIS, ROUTINE W REFLEX MICROSCOPIC  LIPASE, BLOOD   No results found.   No diagnosis found.    MDM   No acute abdomen on exam. Patient has good appetite. Normal white count. Extremely low probability of appendicitis. Patient will return if right lower quadrant pain persists and he loses his appetite.      I personally performed the services described in this documentation, which was scribed in my presence. The recorded information has been reviewed and considered.          Donnetta Hutching, MD 12/23/11 [phone]

## 2012-05-24 ENCOUNTER — Ambulatory Visit (HOSPITAL_COMMUNITY): Payer: Medicare Other | Admitting: Physical Therapy

## 2012-05-27 ENCOUNTER — Ambulatory Visit (HOSPITAL_COMMUNITY)
Admission: RE | Admit: 2012-05-27 | Discharge: 2012-05-27 | Disposition: A | Discharge status: Home / Self Care | Payer: Medicare Other | Source: Ambulatory Visit | Attending: Student | Admitting: Student

## 2012-05-27 DIAGNOSIS — IMO0001 Reserved for inherently not codable concepts without codable children: Secondary | ICD-10-CM | POA: Insufficient documentation

## 2012-05-27 DIAGNOSIS — M25579 Pain in unspecified ankle and joints of unspecified foot: Secondary | ICD-10-CM | POA: Insufficient documentation

## 2012-05-27 DIAGNOSIS — M6281 Muscle weakness (generalized): Secondary | ICD-10-CM | POA: Insufficient documentation

## 2012-05-27 NOTE — Progress Notes (Signed)
Sent eval to Dr. Alleen Borne at Fax # [phone].
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
## 2012-06-02 ENCOUNTER — Ambulatory Visit (HOSPITAL_COMMUNITY)
Admission: RE | Admit: 2012-06-02 | Discharge: 2012-06-02 | Disposition: A | Discharge status: Home / Self Care | Payer: Medicare Other | Source: Ambulatory Visit

## 2012-06-02 NOTE — Progress Notes (Signed)
Physical Therapy Treatment Patient Details  Name: Joseph Pruitt MRN: [id number] Date of Birth: 06/08/1965  Today's Date: 06/02/2012 Time: 0803-0850 PT Time Calculation (min): 47 min Charge :[place] 16', therex 30'  Visit#: 2 of 12  Re-eval: 06/26/12    Authorization: MEDICARE  Authorization Time Period:    Authorization Visit#: 2 of 10   Subjective: Symptoms/Limitations Symptoms: Pt reported compliance with HEP, pain scale for Lt ankle 7/10, Rt 4/10 Pain Assessment Currently in Pain?: Yes Pain Score:   7 Pain Location: Ankle Pain Orientation: Left Multiple Pain Sites: Yes  Objective:   Exercise/Treatments Ankle Stretches Plantar Fascia Stretch: 1 rep;30 seconds;Limitations Plantar Fascia Stretch Limitations: BLE Gastroc Stretch: 1 rep;30 seconds;Limitations Gastroc Stretch Limitations: BLE Ankle Exercises - Seated Towel Crunch: 1 rep;Limitations Towel Crunch Limitations: BLE Towel Inversion/Eversion: 2 reps;Limitations Towel Inversion/Eversion Limitations: BLE    Modalities Modalities: Ultrasound Ultrasound Ultrasound Location: BLE achilles in prone position Ultrasound Parameters: (small head) 0.8 w/cm2 pulsed x 8 minuted each LE Ultrasound Goals: Pain  Physical Therapy Assessment and Plan PT Assessment and Plan Clinical Impression Statement: Began POC for ankle strengthening exercises, stretches and [place] for pain relief.  Pt stated pain reduced following [place] but did state sharp pain with gastroc stretch and plantar fascia stretch to Rt LE. PT Plan: Continue with [place] for pain control, manual techniques if tolerable.  Continue with strengthening activities for ankle and LE    Goals    Problem List Patient Active Problem List  Diagnosis  . MONONEURITIS OF UNSPECIFIED SITE  . NECK PAIN  . SPONDYLOSIS, CERVICAL, WITH RADICULOPATHY  . TENDINITIS, RIGHT WRIST  . CARPAL TUNNEL SYNDROME, HX OF    PT - End of Session Activity Tolerance: Patient tolerated  treatment well General Behavior During Session: [facility] for tasks performed Cognition: [facility] for tasks performed  GP    [name] 06/02/2012, 9:12 AM

## 2012-06-03 ENCOUNTER — Ambulatory Visit (HOSPITAL_COMMUNITY)
Admission: RE | Admit: 2012-06-03 | Discharge: 2012-06-03 | Disposition: A | Discharge status: Home / Self Care | Payer: Medicare Other | Source: Ambulatory Visit | Attending: *Deleted | Admitting: *Deleted

## 2012-06-03 NOTE — Progress Notes (Signed)
Physical Therapy Treatment Patient Details  Name: Joseph Pruitt MRN: [id number] Date of Birth: [birth year]  Today's Date: 06/03/2012 Time: 0755- 846  Time Calculation: 50 minutes Charges: 16' [place], Manual x25, TE x9   Visit#: 3 of 12  Re-eval: 06/26/12   Authorization: MEDICARE  Authorization Time Period:    Authorization Visit#: 3 of 10   Subjective: Symptoms/Limitations Symptoms: Pt states that he is having a lot of pain to his left achilles and increased pain when placing it on the ground.  Rt achilles is doing better.  Pain Assessment Currently in Pain?: Yes Pain Score:   6 Pain Location: Ankle (Right ankle 4/10) Pain Orientation: Left  Precautions/Restrictions     Exercise/Treatments Ankle Stretches Soleus Stretch: 1 rep;30 seconds (Both) Gastroc Stretch: 1 rep;30 seconds (Both) Other Stretch: long sitting hamstring stretch 1 rep 30 sec BLE  Modalities Modalities: Ultrasound Manual Therapy Manual Therapy: Massage Massage: connective tissue release to rt gastroc and solues; soft tissue massage to lt gastroc and insertion of achilles. Calcenal mobs Grade I to lt, Grade II-IV for Rt. Ultrasound Ultrasound Location: BLE achilles in prone position  Ultrasound Parameters: 3 Mhz (small head) 0.8 w/cm 2 pulesed x8 minutes each LE Ultrasound Goals: Pain  Physical Therapy Assessment and Plan PT Assessment and Plan Clinical Impression Statement: Added manual therapy today and able to tolerate deep palpations with significant muscle spasms to rt gastroc which decreased by 75% at end of treatment.  Without muscle spasms to rt gastroc and utilized genlte calcneal mobs to decrease pain.    PT Plan: Continue with [place] for pain control, manual techniques if tolerable.  Continue with strengthening activities for ankle and LE    Goals    Problem List Patient Active Problem List  Diagnosis  . MONONEURITIS OF UNSPECIFIED SITE  . NECK PAIN  . SPONDYLOSIS, CERVICAL, WITH RADICULOPATHY   . TENDINITIS, RIGHT WRIST  . CARPAL TUNNEL SYNDROME, HX OF    PT - End of Session Activity Tolerance: Patient tolerated treatment well General Behavior During Session: [facility] for tasks performed Cognition: [facility] for tasks performed PT Plan of Care PT Patient Instructions: increasing water consumption after manual therapy and stretching without pain.  Consulted and Agree with Plan of Care: Patient  Annett Fabian, PT 06/03/2012, 8:55 AM

## 2012-06-08 ENCOUNTER — Ambulatory Visit (HOSPITAL_COMMUNITY): Payer: Medicare Other | Admitting: Physical Therapy

## 2012-06-09 ENCOUNTER — Ambulatory Visit (HOSPITAL_COMMUNITY)
Admission: RE | Admit: 2012-06-09 | Discharge: 2012-06-09 | Disposition: A | Discharge status: Home / Self Care | Payer: Medicare Other | Source: Ambulatory Visit | Attending: Student | Admitting: Student

## 2012-06-09 DIAGNOSIS — M6281 Muscle weakness (generalized): Secondary | ICD-10-CM | POA: Insufficient documentation

## 2012-06-09 DIAGNOSIS — M25579 Pain in unspecified ankle and joints of unspecified foot: Secondary | ICD-10-CM | POA: Insufficient documentation

## 2012-06-09 DIAGNOSIS — IMO0001 Reserved for inherently not codable concepts without codable children: Secondary | ICD-10-CM | POA: Insufficient documentation

## 2012-06-09 NOTE — Progress Notes (Signed)
Physical Therapy Treatment Patient Details  Name: Joseph Pruitt MRN: [id number] Date of Birth: [birth year]  Today's Date: 06/09/2012 Time: 0803-0850 PT Time Calculation (min): 47 min Charge: [place] 16', Manual 20', therex 10'  Visit#: 4 of 12  Re-eval: 06/26/12    Authorization: MEDICARE  Authorization Time Period:    Authorization Visit#: 4 of 10   Subjective: Symptoms/Limitations Symptoms: Pt reprted he has increased pain Lt achilles that increases with gait.  Lt achilles 9/10, Rt achilles 5/10 Pain Assessment Currently in Pain?: Yes Pain Score:   9 Pain Location: Ankle Pain Orientation: Left  Objective:  Exercise/Treatments Ankle Stretches Plantar Fascia Stretch: 2 reps;30 seconds Slant Board Stretch: 3 reps;30 seconds     Modalities Modalities: Ultrasound Manual Therapy Manual Therapy: Massage Massage: connective tissue release to rt gastroc and soleus; soft tissue massage to lt gastroc and insertion of achilles; calcaneal mobs Grade I-III Bilateral  Ultrasound Ultrasound Location: Bil LE achilles in prone position Ultrasound Parameters: 0.8 w/cm2 pulsed (small head) x 8 minutes each LE Ultrasound Goals: Pain  Physical Therapy Assessment and Plan PT Assessment and Plan Clinical Impression Statement: Continued with [place] and manual therapy to reduce spasms Bil LE gastroc and Rt achilles insertion point,as well as gentle calcaneal joint mobs to improve joint mobility; pt reported pain reduced following with improved gait mechanics PT Plan: Continue with [place] for pain control, manual techniques if tolerable.  Continue with strengthening activities for ankle and LE    Goals    Problem List Patient Active Problem List  Diagnosis  . MONONEURITIS OF UNSPECIFIED SITE  . NECK PAIN  . SPONDYLOSIS, CERVICAL, WITH RADICULOPATHY  . TENDINITIS, RIGHT WRIST  . CARPAL TUNNEL SYNDROME, HX OF    PT - End of Session Activity Tolerance: Patient tolerated treatment  well General Behavior During Session: [facility] for tasks performed Cognition: [facility] for tasks performed  GP    [name] 06/09/2012, 8:54 AM

## 2012-06-10 ENCOUNTER — Ambulatory Visit (HOSPITAL_COMMUNITY)
Admission: RE | Admit: 2012-06-10 | Discharge: 2012-06-10 | Disposition: A | Discharge status: Home / Self Care | Payer: Medicare Other | Source: Ambulatory Visit | Attending: *Deleted | Admitting: *Deleted

## 2012-06-10 NOTE — Progress Notes (Signed)
Physical Therapy Treatment Patient Details  Name: Joseph Pruitt MRN: [id number] Date of Birth: [birth year]  Today's Date: 06/10/2012 Time: 4098-1191 PT Time Calculation (min): 39 min Charges: 16' [place], 23' Manual  Visit#: 5 of 12  Re-eval: 06/26/12    Authorization: MEDICARE  Authorization Time Period:    Authorization Visit#: 5 of 10   Subjective: Symptoms/Limitations Symptoms: Pt reports that his Rt heel is doing better, the Lt one is still very painful.  Continues to have pain when getting out of the truck.  Lt ankle 8/10, rt ankle 4/10.  Reports complaince with some strengthening exercises.   Precautions/Restrictions     Exercise/Treatments Modalities Modalities: Ultrasound Manual Therapy Massage: connective tissue release to rt gastroc and soleus; soft tissue massage to lt gastroc and insertion of achilles; calcaneal mobs Grade I-III Bilateral Ultrasound Ultrasound Location: Bil LE achilles in prone position Ultrasound Parameters: 0.8 w/cm2 pulsed (small head) x 8 minutes each LEtaken   Physical Therapy Assessment and Plan PT Assessment and Plan Clinical Impression Statement: improvement to rt achilles with decreased muscle spsams thorughout.  Pt able to tolerate minmal palpationn to lt achilles without increased pain which has improved from 2 visits ago.   PT Plan: Continue with [place] for pain control, manual techniques if tolerable.  Continue with strengthening activities for ankle and LE    Goals    Problem List Patient Active Problem List  Diagnosis  . MONONEURITIS OF UNSPECIFIED SITE  . NECK PAIN  . SPONDYLOSIS, CERVICAL, WITH RADICULOPATHY  . TENDINITIS, RIGHT WRIST  . CARPAL TUNNEL SYNDROME, HX OF    PT Plan of Care PT Patient Instructions: importance of stretching before getting out of car and ice at night.  Consulted and Agree with Plan of Care: Patient  Joseph Pruitt, PT 06/10/2012, 9:07 AM

## 2012-06-14 ENCOUNTER — Ambulatory Visit (HOSPITAL_COMMUNITY)
Admission: RE | Admit: 2012-06-14 | Discharge: 2012-06-14 | Disposition: A | Discharge status: Home / Self Care | Payer: Medicare Other | Source: Ambulatory Visit | Attending: *Deleted | Admitting: *Deleted

## 2012-06-14 NOTE — Progress Notes (Signed)
Physical Therapy Treatment Patient Details  Name: Joseph Pruitt MRN: [id number] Date of Birth: June 12, 1965  Today's Date: 06/14/2012 Time: 0800-0838 PT Time Calculation (min): 38 min  Visit#: 6 of 12  Re-eval: 06/26/12 Authorization: MEDICARE  Authorization Visit#: 6 of 10  Charges:  Manual 16', [place] X 1 unit  Subjective: Symptoms/Limitations Symptoms: Pt. states he's going to [place] after his session to pick up a car.  States pain 3/10 Rt. and 8/10 Lt. Pain Assessment Currently in Pain?: Yes Pain Score:   8 Pain Location: Ankle Pain Orientation: Left   Exercise/Treatments Completing stretching HEP independently.    Modalities Modalities: Ultrasound Manual Therapy Manual Therapy: Other (comment) Massage: Bilateral gastroc insertion/achilles STM/release while held in gastroc stretch, 8' each Ultrasound Ultrasound Location: Bilateral LE achilles in prone position Ultrasound Parameters: 3 MHz, 1.0 w/cm2 pulsed (small head) X 8' each Ultrasound Goals: Pain  Physical Therapy Assessment and Plan PT Assessment and Plan Clinical Impression Statement: Noted pain reduction with noted improvement in gait quality at end of session.  Reports greater improvement in Rt LE as compared to Lt. PT Plan: Continue with [place] for pain control, manual techniques if tolerable.  Continue with strengthening activities for ankle and LE     Problem List Patient Active Problem List  Diagnosis  . MONONEURITIS OF UNSPECIFIED SITE  . NECK PAIN  . SPONDYLOSIS, CERVICAL, WITH RADICULOPATHY  . TENDINITIS, RIGHT WRIST  . CARPAL TUNNEL SYNDROME, HX OF    General Behavior During Session: [facility] for tasks performed Cognition: [facility] for tasks performed PT Plan of Care PT Patient Instructions: importance of stretching before getting out of car and ice at night.  Consulted and Agree with Plan of Care: Patient   Lurena Nida, PTA/CLT 06/14/2012, 8:40 AM

## 2012-06-16 ENCOUNTER — Ambulatory Visit (HOSPITAL_COMMUNITY)
Admission: RE | Admit: 2012-06-16 | Discharge: 2012-06-16 | Disposition: A | Discharge status: Home / Self Care | Payer: Medicare Other | Source: Ambulatory Visit | Attending: *Deleted | Admitting: *Deleted

## 2012-06-16 NOTE — Progress Notes (Signed)
Physical Therapy Treatment Patient Details  Name: Joseph Pruitt MRN: [id number] Date of Birth: 11/17/1965  Today's Date: 06/16/2012 Time: 0802-0850 PT Time Calculation (min): 48 min  Visit#: 7 of 12  Re-eval: 06/26/12 Charges: Manual x 24' Ultrasound x 16'  Authorization: MEDICARE   Authorization Visit#: 7 of 10   Subjective: Symptoms/Limitations Symptoms: Pt states that ultrasound and manual techniques seem to help.  Pain Assessment Currently in Pain?: Yes Pain Score:   7 Pain Location: Ankle Pain Orientation: Left;Posterior   Exercise/Treatments Modalities Modalities: Ultrasound Manual Therapy Manual Therapy: Myofascial release Myofascial Release: MFR bilateral gastroc insertion/achilles while held in gastroc stretch Ultrasound Ultrasound Location: Bilateral LE achilles in prone position Ultrasound Parameters: 3 MHz, 1.0 w/cm2 pulsed (small head) X 8' each Ultrasound Goals: Pain  Physical Therapy Assessment and Plan PT Assessment and Plan Clinical Impression Statement: Manual techniques and ultrasound completed to bilateral gastroc and posterior heel. Multiple muscle spasms noted in gastroc and adhesions noted in posterior heel. Spasms and adhesions decreased with manual techniques. Pt reports pain decrease to 5/10 in left foot and 2/10 in right foot. PT Plan: Continue with [place] for pain control, manual techniques if tolerable.  Continue with strengthening activities for ankle and LE.     Problem List Patient Active Problem List  Diagnosis  . MONONEURITIS OF UNSPECIFIED SITE  . NECK PAIN  . SPONDYLOSIS, CERVICAL, WITH RADICULOPATHY  . TENDINITIS, RIGHT WRIST  . CARPAL TUNNEL SYNDROME, HX OF    PT - End of Session Activity Tolerance: Patient tolerated treatment well General Behavior During Session: [facility] for tasks performed Cognition: [facility] for tasks performed  Seth Bake, PTA 06/16/2012, 9:06 AM

## 2012-06-18 ENCOUNTER — Ambulatory Visit (HOSPITAL_COMMUNITY)
Admission: RE | Admit: 2012-06-18 | Discharge: 2012-06-18 | Disposition: A | Discharge status: Home / Self Care | Payer: Medicare Other | Source: Ambulatory Visit | Attending: Student | Admitting: Student

## 2012-06-18 NOTE — Progress Notes (Signed)
Physical Therapy Treatment Patient Details  Name: LOTUS GOVER MRN: [id number] Date of Birth: Jul 30, 1965  Today's Date: 06/18/2012 Time: 0815-0900 PT Time Calculation (min): 45 min Charge; [place] x 16', Manual 21', therex 8'  Visit#: 8 of 12  Re-eval: 06/26/12    Authorization: MEDICARE  Authorization Time Period:    Authorization Visit#: 8 of 10   Subjective: Symptoms/Limitations Symptoms: Pt 15 minutes late for apt.  Pt reported both heels are bothering him today,pain increases with weight bearing. Pt stated relief following [place] and manual but pain returns Pain Assessment Currently in Pain?: Yes Pain Score:   6 Pain Location: Ankle Pain Orientation: Right;Left;Posterior  Objective:  Exercise/Treatments Ankle Stretches Plantar Fascia Stretch: 2 reps;30 seconds Slant Board Stretch: 3 reps;30 seconds    Modalities Modalities: Ultrasound Manual Therapy Manual Therapy: Myofascial release Massage: Bilateral gastroc insertion/achilles with STM release gastroc stretch Myofascial Release: MFR bilateral gastroc insertion/achilles with gastroc stretch Ultrasound Ultrasound Location: Bilateral LE achilles in prone position Ultrasound Parameters: 1.0 w/cm2 pulsed (small head) x 8 minutes each Ultrasound Goals: Pain  Physical Therapy Assessment and Plan PT Assessment and Plan Clinical Impression Statement: Continued manual techniques and ultrasound to bilateral gastroc and achiles tendon to reduce multiple spasms and adhesions to posterior heel.  Pt instructed to increase time doing stretches at home for musculature lengthening  and to begin plantar fascia stretch at home for maximum benefits.  Pt reported pain reduced to 3/10 with improved gait mechanics end of session. PT Plan: Continue with [place] for pain control, manual techniques if tolerable.  Continue with strengthening activities for ankle and LE.    Goals    Problem List Patient Active Problem List  Diagnosis  .  MONONEURITIS OF UNSPECIFIED SITE  . NECK PAIN  . SPONDYLOSIS, CERVICAL, WITH RADICULOPATHY  . TENDINITIS, RIGHT WRIST  . CARPAL TUNNEL SYNDROME, HX OF    PT - End of Session Activity Tolerance: Patient tolerated treatment well General Behavior During Session: [facility] for tasks performed Cognition: [facility] for tasks performed  GP    [name] 06/18/2012, 9:02 AM

## 2012-06-21 ENCOUNTER — Ambulatory Visit (HOSPITAL_COMMUNITY)
Admission: RE | Admit: 2012-06-21 | Discharge: 2012-06-21 | Disposition: A | Discharge status: Home / Self Care | Payer: Medicare Other | Source: Ambulatory Visit | Attending: Student | Admitting: Student

## 2012-06-21 NOTE — Progress Notes (Signed)
Physical Therapy Treatment Patient Details  Name: DANDRAE KUSTRA MRN: [id number] Date of Birth: 04/27/1965  Today's Date: 06/21/2012 Time: 0803-0840 PT Time Calculation (min): 37 min  Visit#: 9 of 12  Re-eval: 06/26/12 Authorization: MEDICARE  Authorization Visit#: 9 of 10  Charges:  therex 12', manual 24'  Subjective: Symptoms/Limitations Symptoms: Pt. states last visit helped the most and kept his pain away the longest.   Exercise/Treatments Ankle Stretches Plantar Fascia Stretch: 3 reps;30 seconds Slant Board Stretch: 3 reps;30 seconds    Modalities Modalities: Ultrasound Manual Therapy Manual Therapy: Myofascial release Massage: Bilateral gastroc insertion/achilles with STM release gastroc stretch Myofascial Release: Bilateral gastroc insertion/achilles with STM release gastroc stretch Ultrasound Ultrasound Location: Bilateral LE achilles in prone position Ultrasound Parameters: 1.0 w/cm2 pulsed (small head) x 8 minutes (4'each) Ultrasound Goals: Pain  Physical Therapy Assessment and Plan PT Assessment and Plan Clinical Impression Statement: Discontinued [place] secondary to more than 6 treatments and no significant improvement.  Focused on manual techniques as those seemed to help the most last visit.   MFR and massage performed to bilateral heels while keeping in gastroc stretch.  Pt reported no pain at end of session. PT Plan: Continue with strengthening activities for ankle and LE's as well as manual techniques.     Problem List Patient Active Problem List  Diagnosis  . MONONEURITIS OF UNSPECIFIED SITE  . NECK PAIN  . SPONDYLOSIS, CERVICAL, WITH RADICULOPATHY  . TENDINITIS, RIGHT WRIST  . CARPAL TUNNEL SYNDROME, HX OF    PT - End of Session Activity Tolerance: Patient tolerated treatment well General Behavior During Session: [facility] for tasks performed Cognition: [facility] for tasks performed   Lurena Nida, PTA/CLT 06/21/2012, 8:51 AM

## 2012-06-23 ENCOUNTER — Ambulatory Visit (HOSPITAL_COMMUNITY)
Admission: RE | Admit: 2012-06-23 | Discharge: 2012-06-23 | Disposition: A | Discharge status: Home / Self Care | Payer: Medicare Other | Source: Ambulatory Visit | Attending: Student | Admitting: Student

## 2012-06-23 NOTE — Progress Notes (Signed)
Physical Therapy Treatment/G-code update Patient Details  Name: Joseph Pruitt MRN: [id number] Date of Birth: [birth year]  Today's Date: 06/23/2012 Time: 2130-8657 PT Time Calculation (min): 47 min Charge; [place] 16', Manual 23' Therex 8'  Visit#: 10 of 12  Re-eval: 06/26/12    Authorization: MEDICARE  Authorization Time Period:    Authorization Visit#: 10 of 10   Subjective: Symptoms/Limitations Symptoms: Pt reported Lt achilles tendon pain scale 5/10, Rt is feeling good today. Pain Assessment Currently in Pain?: Yes Pain Score:   5 Pain Location: Ankle Pain Orientation: Left;Posterior  Objective:   Exercise/Treatments Ankle Stretches Plantar Fascia Stretch: 3 reps;30 seconds Slant Board Stretch: 3 reps;30 seconds    Modalities Modalities: Ultrasound Manual Therapy Massage: Bilateral gastroc insertion/achilles with STM release gastroc stretch; calcaneal joint mobs Ultrasound Ultrasound Location: Bilateral LE achilles in prone position Ultrasound Parameters: 1.0 w/cm 2 pulsed (small head) x 8 minutes each Ultrasound Goals: Pain  Physical Therapy Assessment and Plan PT Assessment and Plan Clinical Impression Statement: Resumed [place] following discussion with pt and continues with manual techniques MFR and STM to Bilateral gastrocnemius, heels and joint mobs to Bil calcaneus.  Pt reported pain reduced at end of session.  Pt with improved perceiveid lower extremity functional scale by 2 points. PT Plan: Continue with strengthening activities for ankle and LE's as well as manual techniques.  Re-eval in 2 more sessions    Goals    Problem List Patient Active Problem List  Diagnosis  . MONONEURITIS OF UNSPECIFIED SITE  . NECK PAIN  . SPONDYLOSIS, CERVICAL, WITH RADICULOPATHY  . TENDINITIS, RIGHT WRIST  . CARPAL TUNNEL SYNDROME, HX OF    PT - End of Session Activity Tolerance: Patient tolerated treatment well General Behavior During Therapy: WFL for tasks  assessed/performed Cognition: WFL for tasks performed  GP Functional Assessment Tool Used: LEFS and clinical observation  Functional Limitation: Mobility: Walking and moving around Mobility: Walking and Moving Around Current Status ([id number]): At least 60 percent but less than 80 percent impaired, limited or restricted Mobility: Walking and Moving Around Goal Status [id number]): At least 20 percent but less than 40 percent impaired, limited or restricted  Joseph Pruitt 06/23/2012, 10:14 AM

## 2012-06-25 ENCOUNTER — Ambulatory Visit (HOSPITAL_COMMUNITY)
Admission: RE | Admit: 2012-06-25 | Discharge: 2012-06-25 | Disposition: A | Discharge status: Home / Self Care | Payer: Medicare Other | Source: Ambulatory Visit | Attending: Student | Admitting: Student

## 2012-06-25 NOTE — Progress Notes (Signed)
Physical Therapy Treatment Patient Details  Name: Joseph Pruitt MRN: [id number] Date of Birth: Apr 07, 1965  Today's Date: 06/25/2012 Time: 7829-5621 PT Time Calculation (min): 46 min Charge: [place] 8', Therex 8', Manual 30'  Visit#: 11 of 12  Re-eval: 06/26/12    Authorization: MEDICARE  Authorization Time Period:    Authorization Visit#: 11 of 20   Subjective: Symptoms/Limitations Symptoms: Pt reported Bil heels pain scale 4/10. Pain Assessment Currently in Pain?: Yes Pain Score:   4 Pain Location: Ankle Pain Orientation: Right;Left;Posterior  Objective:   Exercise/Treatments Ankle Stretches Plantar Fascia Stretch: 3 reps;30 seconds Slant Board Stretch: 3 reps;30 seconds     Modalities Modalities: Ultrasound Manual Therapy Manual Therapy: Joint mobilization Joint Mobilization: Calcaneal joint mobs Massage: Bilateral gastrocnemius insertion/achilles with STM release gastroc stretch Ultrasound Ultrasound Location: Bilateral LE achilles in prone position Ultrasound Parameters: 1.0 w/cm2 pulsed (small head) x 4 minutes each ( 8' total) Ultrasound Goals: Pain  Physical Therapy Assessment and Plan PT Assessment and Plan Clinical Impression Statement: Continued manual MFR and STM to bilateral gastrocnemius to reduce spasms with joint mobs to improve ankle mobility and pain relief.  Pt reported decrese in pain at end of session. PT Plan: Re-eval next session.    Goals    Problem List Patient Active Problem List  Diagnosis  . MONONEURITIS OF UNSPECIFIED SITE  . NECK PAIN  . SPONDYLOSIS, CERVICAL, WITH RADICULOPATHY  . TENDINITIS, RIGHT WRIST  . CARPAL TUNNEL SYNDROME, HX OF    PT - End of Session Activity Tolerance: Patient tolerated treatment well General Behavior During Therapy: WFL for tasks assessed/performed Cognition: WFL for tasks performed  GP    [name] 06/25/2012, 10:48 AM

## 2012-06-29 ENCOUNTER — Inpatient Hospital Stay (HOSPITAL_COMMUNITY): Admission: RE | Admit: 2012-06-29 | Payer: Medicare Other | Source: Ambulatory Visit | Admitting: Physical Therapy

## 2012-06-30 ENCOUNTER — Inpatient Hospital Stay (HOSPITAL_COMMUNITY): Admission: RE | Admit: 2012-06-30 | Payer: Medicare Other | Source: Ambulatory Visit

## 2012-07-05 ENCOUNTER — Ambulatory Visit (HOSPITAL_COMMUNITY)
Admission: RE | Admit: 2012-07-05 | Discharge: 2012-07-05 | Disposition: A | Discharge status: Home / Self Care | Payer: Medicare Other | Source: Ambulatory Visit | Attending: *Deleted | Admitting: *Deleted

## 2012-07-05 NOTE — Progress Notes (Signed)
Physical Therapy Re-evaluation/treatment  Patient Details  Name: Joseph Pruitt MRN: [id number] Date of Birth: [birth year]  Today's Date: 07/05/2012 Time: 0805-0845 PT Time Calculation (min): 40 min Charge:MMT x 1, ROM measurement x 1, [place] x 16', Manual x 23'             Visit#: 12 of 20  Re-eval: 08/02/12 Assessment Diagnosis: BLE achilles tendonopathy Next MD Visit: Dr. Alleen Borne [facility]) -   Authorization: MEDICARE    Authorization Time Period:    Authorization Visit#: 12 of 20   Subjective Symptoms/Limitations Symptoms: Did a lot of walking over weekend, and have been stretching it out, pain scale 6/10 today. How long can you stand comfortably?: 10-15 minutes How long can you walk comfortably?: 15-20 minutes Pain Assessment Currently in Pain?: Yes Pain Score:   6 Pain Location: Ankle Pain Orientation: Right;Left;Posterior  Precautions/Restrictions  Precautions Precautions: None   Objective:   Sensation/Coordination/Flexibility/Functional Tests Functional Tests Functional Tests: Lower Extremity Functional Scale (LEFS): 19/80 (was (LEFS): 18/80)  RLE AROM (degrees) Right Ankle Dorsiflexion: 18 (was 0) Right Ankle Plantar Flexion: 38 (was 35) Right Ankle Inversion: 28 (was 28) Right Ankle Eversion: 30 (was 30) RLE Strength Right Hip Extension: 5/5 (was 4/5) Right Hip ABduction: 5/5 (was 4/5) LLE AROM (degrees) Left Ankle Dorsiflexion: 30 (was 5) Left Ankle Plantar Flexion: 30 (was 30) Left Ankle Inversion:  (was 30) Left Ankle Eversion: 23 (was 12) LLE Strength Left Hip Extension: 5/5 (was 4/5) Left Hip ABduction:  (4+/5 was 4/5) Left Ankle Inversion:  (4+/5 was 3+/5) Left Ankle Eversion:  (4+/5 was 3+/5) Palpation Palpation: reduced allodynia to Bil achilles with improved tolerance for palpation  Exercise/Treatments Ankle Stretches Slant Board Stretch: 3 reps;30 seconds \\Modalities  Modalities: Ultrasound Manual Therapy Manual Therapy: Joint  mobilization Joint Mobilization: Calcaneal joint mobs Massage: Bilateral gastrocnemius insertion/achilles with STM release gastroc stretch Ultrasound Ultrasound Location: Bilateral LE achilles in prone position Ultrasound Parameters: 1.0 w/cm2 pulsed (small head) x 8 minutes each ( 16' total)  (Wanda Totten, PTT completed Rt achilles [place] whle manual complete on Lt) Ultrasound Goals: Pain  Physical Therapy Assessment and Plan PT Assessment and Plan Clinical Impression Statement: Re-eval complete. Joseph Pruitt has had 12 OPPT sessions over 5 1/2 weeks with the following findings:  pt reported complaince with HEP daily stretching.  Pt reported pain average 6-10/10 with increased pain with standing and walking, did report increased tolerance to stand and walk   Pt with decreased hypersensitivity with ability to tolerance light and moderate touch.  Improved AROM Bil ankle and improve LE strength.  Pt with improved percieved lower extremity functional scale by 3 points over 5 weeis. PT Plan: Recommend continuing OPPT twice a week for 4 more weeks to address remaining deficits.    Goals Home Exercise Program: Independently: Met PT Short Term Goals: 3 weeks PT Short Term Goal 1: Pt will report pain less than a 6/10 for 50% of his day to BLE achilles for improved QOL: Not met (pain scale range 6-10/10) PT Short Term Goal 2: Pt will improve is Bil ankle AROM to WNL for improved gait mechanics. : Progressing toward goal PT Short Term Goal 3: Pt will begin to tolerate light touch to his rt achilles region in order to progress towards manual therapy to decrease pain. : Met PT Long Term Goals: 6 weeks PT Long Term Goal 1: Pt will report pain less than 3/10 for 75% of his day for improved QOL. Not met PT Long Term Goal 2:  Pt will improve ankle strength and function in order to ambulate for 1 hour with pain less than 3/10 in order to participate in coaching activities. : Progressing toward goal Long Term Goal  3: Pt wil improve his LEFS to 40/80 for improved percieved functional ability. : Not met (LEFS 19/80) Long Term Goal 4: Pt will present with minimal fascial restrictions in order to decrease pain and improve LE function. : Progressing toward goal  Problem List Patient Active Problem List  Diagnosis  . MONONEURITIS OF UNSPECIFIED SITE  . NECK PAIN  . SPONDYLOSIS, CERVICAL, WITH RADICULOPATHY  . TENDINITIS, RIGHT WRIST  . CARPAL TUNNEL SYNDROME, HX OF   PT - End of Session Activity Tolerance: Patient tolerated treatment well General Behavior During Therapy: WFL for tasks assessed/performed Cognition: WFL for tasks performed  Becky Sax Jo/Wil Slape, MPT, ATC 07/05/2012, 10:30 AM

## 2012-07-07 ENCOUNTER — Ambulatory Visit (HOSPITAL_COMMUNITY): Payer: Medicare Other | Admitting: Physical Therapy

## 2012-07-09 ENCOUNTER — Ambulatory Visit (HOSPITAL_COMMUNITY)
Admission: RE | Admit: 2012-07-09 | Discharge: 2012-07-09 | Disposition: A | Discharge status: Home / Self Care | Payer: Medicare Other | Source: Ambulatory Visit | Attending: Student | Admitting: Student

## 2012-07-09 DIAGNOSIS — IMO0001 Reserved for inherently not codable concepts without codable children: Secondary | ICD-10-CM | POA: Insufficient documentation

## 2012-07-09 DIAGNOSIS — M6281 Muscle weakness (generalized): Secondary | ICD-10-CM | POA: Insufficient documentation

## 2012-07-09 DIAGNOSIS — M25579 Pain in unspecified ankle and joints of unspecified foot: Secondary | ICD-10-CM | POA: Insufficient documentation

## 2012-07-09 NOTE — Progress Notes (Signed)
Physical Therapy Treatment Patient Details  Name: Joseph Pruitt MRN: [id number] Date of Birth: October 30, 1965  Today's Date: 07/09/2012 Time: 0802-0848 PT Time Calculation (min): 46 min  Visit#: 13 of 20  Re-eval: 08/02/12 Charges: [place] x 16' Manual x 23'  Authorization: MEDICARE  Authorization Visit#: 13 of 20   Subjective: Symptoms/Limitations Symptoms: Pt states that his pain is lower after therapy but when he walks it increases. Pain Assessment Currently in Pain?: Yes Pain Score:   6 Pain Location: Ankle Pain Orientation: Left;Posterior  Precautions/Restrictions     Exercise/Treatments  Modalities Modalities: Ultrasound Manual Therapy Manual Therapy: Joint mobilization Joint Mobilization: Calcaneal joint mobs Myofascial Release: Bilateral gastroc insertion/achilles with STM release gastroc  Ultrasound Ultrasound Location: Bilateral LE achilles in prone position Ultrasound Parameters: 1.0 w/cm2 pulsed (small head) x 8 minutes each ( 16' total)  Ultrasound Goals: Pain  Physical Therapy Assessment and Plan PT Assessment and Plan Clinical Impression Statement: Manual techniques and ultrasound completed to bilateral gastroc to decrease pain and tightness Pt has most pain at gastroc insertions. Pt reports pain decrease to 3/10 in left ankle and 0/10 in right ankle. PT Plan: Continue to utilize manual techniques and modalities to decrease pain and tightness.     Problem List Patient Active Problem List   Diagnosis Date Noted  . CARPAL TUNNEL SYNDROME, HX OF 02/14/2009  . MONONEURITIS OF UNSPECIFIED SITE 11/22/2008  . 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 TENDINITIS, RIGHT WRIST 11/22/2008  . NECK PAIN 08/14/2008    PT - End of Session Activity Tolerance: Patient tolerated treatment well General Behavior During Therapy: [facility] for tasks assessed/performed Cognition: WFL for tasks performed  Seth Bake, PTA 07/09/2012, 9:59 AM

## 2012-07-12 ENCOUNTER — Ambulatory Visit (HOSPITAL_COMMUNITY)
Admission: RE | Admit: 2012-07-12 | Discharge: 2012-07-12 | Disposition: A | Discharge status: Home / Self Care | Payer: Medicare Other | Source: Ambulatory Visit | Attending: *Deleted | Admitting: *Deleted

## 2012-07-12 NOTE — Progress Notes (Signed)
Physical Therapy Treatment Patient Details  Name: Joseph Pruitt MRN: [id number] Date of Birth: May 11, 1965  Today's Date: 07/12/2012 Time: 0805-0844 PT Time Calculation (min): 39 min Visit#: 14 of 20  Re-eval: 08/02/12 Charges:  therex 8', manual 20', [place] 16' (8' each posterior heel) Authorization: MEDICARE  Authorization Visit#: 14 of 20   Subjective: Symptoms/Limitations Symptoms: Pt. states he's having alot of pain today.  Currently R:8/10, L:6/10. Pain Assessment Currently in Pain?: Yes Pain Score:   6 Pain Location: Ankle Pain Orientation: Left   Exercise/Treatments Ankle Stretches Slant Board Stretch: 3 reps;30 seconds   Modalities Modalities: Ultrasound Manual Therapy Manual Therapy: Joint mobilization Joint Mobilization: Calcaneal joint mobs Massage: Bilateral gastrocnemius insertion/achilles with STM release gastroc stretch Myofascial Release: Bilateral gastroc insertion/achilles with STM release gastroc  Ultrasound Ultrasound Location: Bilateral LE achilles in prone position Ultrasound Parameters: 1.0 w/cm2 pulsed (small head) x 8 minutes each ( 16' total)  Ultrasound Goals: Pain  Physical Therapy Assessment and Plan PT Assessment and Plan Clinical Impression Statement: Pt. very sensitive to manual on posterior L lateral heel.  Overall improvement following manual and [place] treatment today, however continued pain 5-6/10. PT Plan: Continue to utilize manual techniques and modalities to decrease pain and tightness.     Problem List Patient Active Problem List   Diagnosis Date Noted  . CARPAL TUNNEL SYNDROME, HX OF 02/14/2009  . MONONEURITIS OF UNSPECIFIED SITE 11/22/2008  . 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 TENDINITIS, RIGHT WRIST 11/22/2008  . NECK PAIN 08/14/2008    PT - End of Session Activity Tolerance: Patient tolerated treatment well General Behavior During Therapy: [facility] for tasks assessed/performed Cognition: WFL for tasks  performed   Lurena Nida, PTA/CLT 07/12/2012, 9:34 AM

## 2012-07-14 ENCOUNTER — Ambulatory Visit (HOSPITAL_COMMUNITY)
Admission: RE | Admit: 2012-07-14 | Discharge: 2012-07-14 | Disposition: A | Discharge status: Home / Self Care | Payer: Medicare Other | Source: Ambulatory Visit | Attending: *Deleted | Admitting: *Deleted

## 2012-07-14 NOTE — Progress Notes (Signed)
Physical Therapy Treatment Patient Details  Name: Joseph Pruitt MRN: [id number] Date of Birth: 12-23-1965  Today's Date: 07/14/2012 Time: 0815-0845 PT Time Calculation (min): 30 min Charge: Manual 22', [place] 8' (total ; PTT completed US to Rt ankle while PTA did manual on Lt ankle)  Visit#: 15 of 20  Re-eval: 08/02/12    Authorization: MEDICARE  Authorization Time Period:    Authorization Visit#: 15 of 20   Subjective: Symptoms/Limitations Symptoms: Pt 15 minutes late.  Pt reported pain free in Rt anikle and pain scale 3/10 for Lt ankle. Pain Assessment Currently in Pain?: Yes Pain Score:   3 Pain Location: Ankle Pain Orientation: Left  Objective:   Exercise/Treatments  Modalities Modalities: Ultrasound Manual Therapy Manual Therapy: Myofascial release Joint Mobilization: Calcaneal joint mobs Myofascial Release: Lt gastroc insertion/achilles with STM release gastroc Ultrasound Ultrasound Location: Bilateral LE achilles in prone position Ultrasound Parameters: 1.0 w/cm2 pulsed (small head) x 8 minutes each ( 16' total)  (Wanda Totten, PTT completed US to Rt while PTA completing manual to Lt.) Ultrasound Goals: Pain  Physical Therapy Assessment and Plan PT Assessment and Plan Clinical Impression Statement: Session focus on manual techniques to reduce fascial restrictions on Lt posterior heel and achilles region for pain reduction.  Pt reported pain free following manual and [place].   PT Plan: Continue to utilize manual techniques and modalities to decrease pain and tightness.    Goals    Problem List Patient Active Problem List   Diagnosis Date Noted  . CARPAL TUNNEL SYNDROME, HX OF 02/14/2009  . MONONEURITIS OF UNSPECIFIED SITE 11/22/2008  . 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 TENDINITIS, RIGHT WRIST 11/22/2008  . NECK PAIN 08/14/2008    PT - End of Session Activity Tolerance: Patient tolerated treatment well General Behavior During  Therapy: [facility] for tasks assessed/performed Cognition: WFL for tasks performed  GP    [name] 07/14/2012, 8:55 AM

## 2012-07-16 ENCOUNTER — Ambulatory Visit (HOSPITAL_COMMUNITY)
Admission: RE | Admit: 2012-07-16 | Discharge: 2012-07-16 | Disposition: A | Discharge status: Home / Self Care | Payer: Medicare Other | Source: Ambulatory Visit | Attending: *Deleted | Admitting: *Deleted

## 2012-07-16 NOTE — Progress Notes (Signed)
Physical Therapy Treatment Patient Details  Name: BLESSING OZGA MRN: [id number] Date of Birth: [birth year]  Today's Date: 07/16/2012 Time: 0801-0840 PT Time Calculation (min): 39 min Charge: [place] x 8', Manual 25'  Visit#: 16 of 20  Re-eval: 08/02/12    Authorization: MEDICARE  Authorization Time Period:    Authorization Visit#: 16 of 20   Subjective: Symptoms/Limitations Symptoms: Pt stated Rt ankle feeling good today, woke up with Lt ankle pain scale 3/10 Pain Assessment Currently in Pain?: Yes Pain Score:   3 Pain Location: Ankle Pain Orientation: Left  Objective:   Exercise/Treatments Modalities Modalities: Ultrasound Manual Therapy Manual Therapy: Joint mobilization Joint Mobilization: Calcaneal joint mobs Massage: Lt gastrocnemius insertion/achilles Myofascial Release: Lt gastroc insertion/achilles with STM release gastroc Ultrasound Ultrasound Location: Lt LE achilles in prone position Ultrasound Parameters: 1.0 w/cm2 pulsed (small head) x 8 minutes Ultrasound Goals: Pain  Physical Therapy Assessment and Plan PT Assessment and Plan Clinical Impression Statement: Session focus on [place] and manual to Lt achilles, gastroc and soleus to reduce fascial restrions, improve joint mobs and reduce pain.  Held treatment to Rt achilles due to pain free for past 2 sessions.  Pt reported pain free at end of session with improved gait mechanics. PT Plan: Continue to utilize manual techniques and modalities to decrease pain and tightness.    Goals    Problem List Patient Active Problem List   Diagnosis Date Noted  . CARPAL TUNNEL SYNDROME, HX OF 02/14/2009  . MONONEURITIS OF UNSPECIFIED SITE 11/22/2008  . 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 TENDINITIS, RIGHT WRIST 11/22/2008  . NECK PAIN 08/14/2008    PT - End of Session Activity Tolerance: Patient tolerated treatment well General Behavior During Therapy: [facility] for tasks  assessed/performed Cognition: WFL for tasks performed  GP    [name] 07/16/2012, 8:41 AM

## 2012-07-22 ENCOUNTER — Telehealth (HOSPITAL_COMMUNITY): Payer: Self-pay

## 2012-07-22 ENCOUNTER — Ambulatory Visit (HOSPITAL_COMMUNITY)
Admission: RE | Admit: 2012-07-22 | Discharge: 2012-07-22 | Disposition: A | Discharge status: Home / Self Care | Payer: Medicare Other | Source: Ambulatory Visit | Attending: *Deleted | Admitting: *Deleted

## 2012-07-22 NOTE — Evaluation (Signed)
Physical Therapy Re-Evaluation  Patient Details  Name: Joseph Pruitt MRN: [id number] Date of Birth: 25-Dec-1965  Today's Date: 07/22/2012 Time: 0805-0845 PT Time Calculation (min): 40 min Charges: 1 MMT, 1 ROM, 20' Self Care, 8' [place], 8' Manual              Visit#: 17 of 20  Re-eval: 08/02/12 Assessment Diagnosis: BLE achilles tendonopathy Next MD Visit: Dr. Alleen Borne [facility]) - May 21 or 22nd  Authorization: MEDICARE    Authorization Time Period:    Authorization Visit#: 17 of 20   Subjective Symptoms/Limitations Symptoms: Pt reports that overall he is feeling better to his Rt ankle and Lt ankle still has pain in it.  He still has pain to his ankle when he is walking.  He tried ushering ago about 3 weeks ago, but continued to be painful.  He  How long can you walk comfortably?: 15-20 minutes (was 15-20 minutes) Pain Assessment Currently in Pain?: Yes Pain Score:   4  Sensation/Coordination/Flexibility/Functional Tests Functional Tests Functional Tests: Lower Extremity Functional Scale (LEFS): 26/80 (was 19/80)  RLE AROM (degrees) Right Ankle Dorsiflexion: 10 (was 8) Right Ankle Plantar Flexion: 38 (was 38) Right Ankle Inversion: 32 (was 28) Right Ankle Eversion: 30 (was 30)  RLE Strength Right Ankle Dorsiflexion: 5/5 Right Ankle Plantar Flexion: 5/5 Right Ankle Inversion: 5/5 Right Ankle Eversion: 5/5  LLE AROM (degrees) Left Ankle Dorsiflexion: 10 (was 5) Left Ankle Plantar Flexion: 30 (was 30) Left Ankle Inversion: 30 (was 30) Left Ankle Eversion: 18 (was 12)  LLE Strength Left Ankle Dorsiflexion: 5/5 Left Ankle Plantar Flexion: 3/5 (unable to perform single heel raise) Left Ankle Inversion: 5/5 Left Ankle Eversion: 5/5  Palpation Palpation: No longer allodynia to BLE, no pain and tenderness to rt ankle, continues to have pain and tenderness to lt ankle with moderate fascial restrictions  Exercise/Treatments Modalities Modalities: Ultrasound Manual  Therapy Manual Therapy: Joint mobilization Joint Mobilization: Calcaneal joint mobs Myofascial Release:  Lt gastroc insertion/achilles with STM release gastroc using hawk grips with light pressure Ultrasound Ultrasound Location: Lt LE achilles in prone position Ultrasound Parameters: 0.5 w/cm2 continuos (small head) x 8 minutes Ultrasound Goals: Pain  Physical Therapy Assessment and Plan PT Assessment and Plan Clinical Impression Statement: Joseph Pruitt has attended 17 OP PT visits to address bilateral ankle/achilles pain with the following findings: overall Rt achilles/ankle has improved (3-4/10 pain average) with significant decrease in fascial restrcitions, ROM WFL and strength WNL.  Continues to have greatest deficit with Lt achilles pain (6-8/10) and moderate swelling.  Pt had manual therapy, ultrasound modalities, flexibility and strengthening for Rt and Lt LE.  At this time recommend pt f/u with MD to discuss further treatment options.  If MD feels necessary to continue with therapy will re-schedule appointments.  At this time pt will contiue with his HEP and massage at home until f/u with MD.   PT Plan: f/u with MD apt.  If pt does not rescheudle, pt will be d/c from PT.  If MD wants continued therapy continue with strengthening LE, may add e-stim for pain control.     Goals Home Exercise Program Pt will Perform Home Exercise Program: Independently PT Goal: Perform Home Exercise Program - Progress: Met PT Short Term Goals Time to Complete Short Term Goals: 3 weeks PT Short Term Goal 1: Pt will report pain less than a 6/10 for 50% of his day to BLE achilles for improved QOL.  (Lt avg: 6-8/10, Rt avg: 3-4/10) PT Short  Term Goal 1 - Progress: Partly met PT Short Term Goal 2: Pt will improve is Bil ankle AROM to WNL for improved gait mechanics.  PT Short Term Goal 2 - Progress: Met PT Short Term Goal 3: Pt will begin to tolerate light touch to his rt achilles region in order to progress  towards manual therapy to decrease pain.  PT Short Term Goal 3 - Progress: Met PT Long Term Goals Time to Complete Long Term Goals: Other (comment) (6 weeks. ) PT Long Term Goal 1: Pt will report pain less than 3/10 for 75% of his day for improved QOL.  (met to RLE, not met to LLE) PT Long Term Goal 1 - Progress: Partly met PT Long Term Goal 2: Pt will improve ankle strength and function in order to ambulate for 1 hour with pain less than 3/10 in order to participate in coaching activities.  (Continues to be 15-20 minutes with pain) PT Long Term Goal 2 - Progress: Progressing toward goal Long Term Goal 3: Pt wil improve his LEFS to 40/80 for improved percieved functional ability.  (26/80) Long Term Goal 3 Progress: Not met Long Term Goal 4: Pt will present with minimal fascial restrictions in order to decrease pain and improve LE function.  Long Term Goal 4 Progress: Met  Problem List Patient Active Problem List   Diagnosis Date Noted  . CARPAL TUNNEL SYNDROME, HX OF 02/14/2009  . MONONEURITIS OF UNSPECIFIED SITE 11/22/2008  . 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 TENDINITIS, RIGHT WRIST 11/22/2008  . NECK PAIN 08/14/2008    PT - End of Session Activity Tolerance: Patient tolerated treatment well General Behavior During Therapy: WFL for tasks assessed/performed Cognition: WFL for tasks performed PT Plan of Care PT Patient Instructions: discussed importance to continue with HEP, f/u with MD and LEFS Consulted and Agree with Plan of Care: Patient  Annett Fabian, MPT, ATC 07/22/2012, 8:58 AM  Physician Documentation Your signature is required to indicate approval of the treatment plan as stated above.  Please sign and either send electronically or make a copy of this report for your files and return this physician signed original.   Please mark one 1.__approve of plan  2. ___approve of plan with the following conditions.   ______________________________                                                           _____________________ Physician Signature                                                                                                             Date

## 2012-07-23 ENCOUNTER — Ambulatory Visit (HOSPITAL_COMMUNITY): Payer: Medicare Other

## 2012-07-27 ENCOUNTER — Ambulatory Visit (HOSPITAL_COMMUNITY): Payer: Medicare Other

## 2012-07-29 ENCOUNTER — Ambulatory Visit (HOSPITAL_COMMUNITY): Payer: Medicare Other | Admitting: Physical Therapy

## 2012-08-03 ENCOUNTER — Ambulatory Visit (HOSPITAL_COMMUNITY): Payer: Medicare Other | Admitting: Physical Therapy

## 2012-08-05 ENCOUNTER — Ambulatory Visit (HOSPITAL_COMMUNITY): Payer: Medicare Other | Admitting: Physical Therapy

## 2013-01-18 ENCOUNTER — Ambulatory Visit (HOSPITAL_COMMUNITY): Payer: Medicare Other | Admitting: Physical Therapy

## 2013-01-19 ENCOUNTER — Ambulatory Visit (HOSPITAL_COMMUNITY): Payer: Medicare Other | Admitting: Physical Therapy

## 2013-01-20 ENCOUNTER — Ambulatory Visit (HOSPITAL_COMMUNITY)
Admission: RE | Admit: 2013-01-20 | Discharge: 2013-01-20 | Disposition: A | Discharge status: Home / Self Care | Payer: Medicare Other | Source: Ambulatory Visit | Attending: Orthopedic Surgery | Admitting: Orthopedic Surgery

## 2013-01-20 DIAGNOSIS — R29898 Other symptoms and signs involving the musculoskeletal system: Secondary | ICD-10-CM

## 2013-01-20 DIAGNOSIS — M25476 Effusion, unspecified foot: Secondary | ICD-10-CM | POA: Insufficient documentation

## 2013-01-20 DIAGNOSIS — M25473 Effusion, unspecified ankle: Secondary | ICD-10-CM | POA: Insufficient documentation

## 2013-01-20 DIAGNOSIS — M25579 Pain in unspecified ankle and joints of unspecified foot: Secondary | ICD-10-CM | POA: Insufficient documentation

## 2013-01-20 DIAGNOSIS — M6281 Muscle weakness (generalized): Secondary | ICD-10-CM | POA: Insufficient documentation

## 2013-01-20 DIAGNOSIS — M25572 Pain in left ankle and joints of left foot: Secondary | ICD-10-CM

## 2013-01-20 DIAGNOSIS — R262 Difficulty in walking, not elsewhere classified: Secondary | ICD-10-CM | POA: Insufficient documentation

## 2013-01-20 DIAGNOSIS — IMO0001 Reserved for inherently not codable concepts without codable children: Secondary | ICD-10-CM | POA: Insufficient documentation

## 2013-01-20 NOTE — Evaluation (Signed)
Physical Therapy Evaluation  Patient Details  Name: Joseph Pruitt MRN: [id number] Date of Birth: 1965/06/02  Today's Date: 01/20/2013 Time: 0810-0845 PT Time Calculation (min): 35 min              Visit#: 1 of 8  Re-eval: 02/19/13 Assessment Diagnosis: Rt achilles surgery Surgical Date: 11/05/12 Next MD Visit: Dr.Ostrum - 02/09/13  Authorization: Medicare    Authorization Time Period:    Authorization Visit#: 1 of 10   Past Medical History:  Past Medical History  Diagnosis Date  . Stomach ulcer    Past Surgical History:  Past Surgical History  Procedure Laterality Date  . Spine surgery    . Hand surgery      Subjective Symptoms/Limitations Symptoms: Pt is a 47 year old male referred back to PT s/p achilles surgery approximatly 3 months ago.  He reports that he was placed in a boot and on crutches for about 2 months, then was allowed to progress towards weight bearing with the boot for about 3 weeks.  He continues to have pain. Is starting to have numbness to his Lt foot.   Patient Stated Goals: be able to walk independently without the boot and without a limp.  Pain Assessment Currently in Pain?: Yes Pain Score: 7  Pain Location: Ankle Pain Orientation: Left Pain Type: Acute pain Pain Relieving Factors: pain medicaion 2x/day and ice, gentle ankle AROM  Precautions/Restrictions  Restrictions Weight Bearing Restrictions: Yes LLE Weight Bearing: Weight bearing as tolerated LLE Partial Weight Bearing Percentage or Pounds: with CAM boot on  Balance Screening Balance Screen Has the patient fallen in the past 6 months: No Has the patient had a decrease in activity level because of a fear of falling? : Yes Is the patient reluctant to leave their home because of a fear of falling? : No  Prior Functioning  Prior Function Level of Independence: Independent with basic ADLs  Able to Take Stairs?: Yes Driving: Yes Vocation: On disability Comments: Enjoys playing  basketball  Cognition/Observation Observation/Other Assessments Observations: swelling to lateral calcenal and posterior lateral mallenous  Sensation/Coordination/Flexibility/Functional Tests Coordination Gross Motor Movements are Fluid and Coordinated: No Coordination and Movement Description: unable to independently coordniate toe flexor and extnesor musculature independent of Hallicus extensor and flexor.  Functional Tests Functional Tests: FOTO: 28/72  Assessment LLE AROM (degrees) Left Ankle Dorsiflexion: 10 Left Ankle Plantar Flexion: 15 Left Ankle Inversion: 14 (PROM: 20) Left Ankle Eversion: 0 (PROM: 10) LLE Strength Left Ankle Dorsiflexion: 5/5 Left Ankle Plantar Flexion: 2/5 Left Ankle Inversion: 2/5 Left Ankle Eversion: 3/5 Palpation Palpation: allodynia to achilles incision and edema  Exercise/Treatments Ankle Stretches Other Stretch: long sitting hamstring stretch 1x30 sec Ankle Exercises - Seated ABC's: 1 rep Ankle Circles/Pumps: AROM;Left;5 reps;Limitations Ankle Circles/Pumps Limitations: clockwise and counterclockwise Towel Inversion/Eversion: 5 reps;Limitations Towel Inversion/Eversion Limitations: both direction Heel Raises: 15 reps Toe Raise: 15 reps Other Seated Ankle Exercises: Toe Yoga with cueing for proper activaition    Physical Therapy Assessment and Plan PT Assessment and Plan Clinical Impression Statement: Pt is a 47 year old male referred to PT s/p achilles surgery with impairments listed below.  At this time pt is most limited by his pain and edema to his foot.  At this time he is to be in his boot with weightbearing..   Pt will benefit from skilled therapeutic intervention in order to improve on the following deficits: Pain;Decreased strength;Difficulty walking;Decreased coordination;Decreased mobility;Increased fascial restricitons;Increased muscle spasms;Impaired perceived functional ability;Impaired flexibility;Decreased balance Rehab  Potential: Good PT Frequency: Min 2X/week PT Duration: 8 weeks PT Treatment/Interventions: Therapeutic activities;Therapeutic exercise;Balance training;Neuromuscular re-education;Patient/family education;Gait training;Stair training;Functional mobility training PT Plan: Continue with seated activities (towel exercises, marbles, heel/toe raises) progress towards seated BAPS and hamstring stretch and toe yoga    Goals Home Exercise Program Pt/caregiver will Perform Home Exercise Program: Independently PT Goal: Perform Home Exercise Program - Progress: Met PT Short Term Goals Time to Complete Short Term Goals: 4 weeks PT Short Term Goal 1: Pt will report present with minimal fascial restriction ans muscle spasms to incision and gastroc region to report pain less than 5/10 when walking.  PT Short Term Goal 2: Pt will improve his ankle plantarlfexion to 30 degress to prepare for independent walking in normal shoes to improve toe off during gait.  PT Short Term Goal 3: Pt will improve his Lt ankle strength by 1 muscle grade to prepare for independent walking in normal shoes.  PT Long Term Goals Time to Complete Long Term Goals: 8 weeks PT Long Term Goal 1: Pt will improve his ankle strength in order ambulate with mild gait impairments to decrease risk of secondary injury.  PT Long Term Goal 2: Pt will improve his FOTO to status greater than 52% and limiation less than 48% for improved QOL.  Long Term Goal 3: Pt will improve Lt ankle AROM to [facility] in order to ascend and descend steps without increased difficulty.   Problem List Patient Active Problem List   Diagnosis Date Noted  . Pain in joint, ankle and foot 01/20/2013  . Left leg weakness 01/20/2013  . CARPAL TUNNEL SYNDROME, HX OF 02/14/2009  . MONONEURITIS OF UNSPECIFIED SITE 11/22/2008  . 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 TENDINITIS, RIGHT WRIST 11/22/2008  . NECK PAIN 08/14/2008    PT Plan of Care PT Home  Exercise Plan: given  PT Patient Instructions: secondary impairments of CAM boot Consulted and Agree with Plan of Care: Patient  GP Functional Assessment Tool Used: FOTO 28/72 Functional Limitation: Mobility: Walking and moving around Mobility: Walking and Moving Around Current Status ([id number]): At least 60 percent but less than 80 percent impaired, limited or restricted Mobility: Walking and Moving Around Goal Status [phone]): At least 40 percent but less than 60 percent impaired, limited or restricted  Quantina Dershem, MPT, ATC 01/20/2013, 10:30 AM  Physician Documentation Your signature is required to indicate approval of the treatment plan as stated above.  Please sign and either send electronically or make a copy of this report for your files and return this physician signed original.   Please mark one 1.__approve of plan  2. ___approve of plan with the following conditions.   ______________________________                                                          _____________________ Physician Signature  Date  

## 2013-01-25 ENCOUNTER — Ambulatory Visit (HOSPITAL_COMMUNITY)
Admission: RE | Admit: 2013-01-25 | Discharge: 2013-01-25 | Disposition: A | Discharge status: Home / Self Care | Payer: Medicare Other | Source: Ambulatory Visit | Attending: *Deleted | Admitting: *Deleted

## 2013-01-25 DIAGNOSIS — R29898 Other symptoms and signs involving the musculoskeletal system: Secondary | ICD-10-CM

## 2013-01-25 DIAGNOSIS — M25572 Pain in left ankle and joints of left foot: Secondary | ICD-10-CM

## 2013-01-25 NOTE — Progress Notes (Addendum)
Physical Therapy Treatment Patient Details  Name: Joseph Pruitt MRN: [id number] Date of Birth: 1965-04-28  Today's Date: 01/25/2013 Time: 0803-0850 PT Time Calculation (min): 47 min Charge: TE [id number], Manual [id number]  Visit#: 2 of 8  Re-eval: 02/19/13 Assessment Diagnosis: Rt achilles surgery Surgical Date: 11/05/12 Next MD Visit: Dr.Ostrum - 02/09/13  Authorization: Medicare  Authorization Time Period:    Authorization Visit#: 2 of 10   Subjective: Symptoms/Limitations Symptoms: Pt reports compliance with HEP, stated pain scale 5-6/10 ankle pain Pain Assessment Currently in Pain?: Yes Pain Score: 6  Pain Location: Ankle Pain Orientation: Left  Objective:  Exercise/Treatments Ankle Stretches Gastroc Stretch: 3 reps;30 seconds;Limitations Gastroc Stretch Limitations: long sitting with towe; Other Stretch: supine hamstring 3x 30" with rope Ankle Exercises - Seated Towel Crunch: 1 rep Towel Inversion/Eversion: 5 reps;Limitations Towel Inversion/Eversion Limitations: both direction Marble Pickup: 10 marbles 5 small, 5 large Heel Raises: 15 reps Toe Raise: 15 reps BAPS: Sitting;Level 2;10 reps;Limitations BAPS Limitations: DF/PF, Inv/Ev, CW/CCW   Manual: MFR to posterior gastroc and achilles region to reduce fascial restrictions.  Utilized hands and "the stick"  Physical Therapy Assessment and Plan PT Assessment and Plan Clinical Impression Statement: Began ankle exercises for strengthening and coordination, pt limited by pain especially wirh inversion and eversion movements..  Added towel gastroc stretch and hamstring stretches to improve flexibility Following discussion wth PT, added manual techniques to reduce fascial restrictions in gastrocnemius and achilles tendon region, pt reported pain reduced to 3-4/10.   PT Treatment/Interventions: Therapeutic activities;Therapeutic exercise;Balance training;Neuromuscular re-education;Patient/family education;Gait  training;Stair training;Functional mobility training;Manual techniques PT Plan: Continue with seated activities and stretchs,add toe yoga next session.  Continue with manual techniques as needed.      Goals Home Exercise Program Pt/caregiver will Perform Home Exercise Program: Independently PT Short Term Goals Time to Complete Short Term Goals: 4 weeks PT Short Term Goal 1: Pt will report present with minimal fascial restriction ans muscle spasms to incision and gastroc region to report pain less than 5/10 when walking.  PT Short Term Goal 2: Pt will improve his ankle plantarlfexion to 30 degress to prepare for independent walking in normal shoes to improve toe off during gait.  PT Short Term Goal 2 - Progress: Progressing toward goal PT Short Term Goal 3: Pt will improve his Lt ankle strength by 1 muscle grade to prepare for independent walking in normal shoes.  PT Short Term Goal 3 - Progress: Progressing toward goal PT Long Term Goals Time to Complete Long Term Goals: 8 weeks PT Long Term Goal 1: Pt will improve his ankle strength in order ambulate with mild gait impairments to decrease risk of secondary injury.  PT Long Term Goal 2: Pt will improve his FOTO to status greater than 52% and limiation less than 48% for improved QOL.  Long Term Goal 3: Pt will improve Lt ankle AROM to [facility] in order to ascend and descend steps without increased difficulty.   Problem List Patient Active Problem List   Diagnosis Date Noted  . Pain in joint, ankle and foot 01/20/2013  . Left leg weakness 01/20/2013  . CARPAL TUNNEL SYNDROME, HX OF 02/14/2009  . MONONEURITIS OF UNSPECIFIED SITE 11/22/2008  . 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 TENDINITIS, RIGHT WRIST 11/22/2008  . NECK PAIN 08/14/2008    PT - End of Session Activity Tolerance: Patient tolerated treatment well;Patient limited by pain General Behavior During Therapy: [facility] for tasks assessed/performed  GP    Joseph Pruitt 01/25/2013,  9:02 AM

## 2013-01-27 ENCOUNTER — Inpatient Hospital Stay (HOSPITAL_COMMUNITY): Admission: RE | Admit: 2013-01-27 | Payer: Medicare Other | Source: Ambulatory Visit

## 2013-02-01 ENCOUNTER — Ambulatory Visit (HOSPITAL_COMMUNITY)
Admission: RE | Admit: 2013-02-01 | Discharge: 2013-02-01 | Disposition: A | Discharge status: Home / Self Care | Payer: Medicare Other | Source: Ambulatory Visit | Attending: *Deleted | Admitting: *Deleted

## 2013-02-01 DIAGNOSIS — R29898 Other symptoms and signs involving the musculoskeletal system: Secondary | ICD-10-CM

## 2013-02-01 DIAGNOSIS — M25572 Pain in left ankle and joints of left foot: Secondary | ICD-10-CM

## 2013-02-01 NOTE — Progress Notes (Signed)
Physical Therapy Treatment Patient Details  Name: Joseph Pruitt MRN: [id number] Date of Birth: Mar 08, 1966  Today's Date: 02/01/2013 Time: 8295-6213 PT Time Calculation (min): 39 min Charges: Manual: [id number] TE: [id number] Visit#: 3 of 8  Re-eval: 02/19/13 Assessment Diagnosis: Rt achilles surgery Surgical Date: 11/05/12 Next MD Visit: Dr.Ostrum - 02/09/13  Authorization: Medicare  Authorization Time Period:    Authorization Visit#: 3 of 10   Subjective: Symptoms/Limitations Symptoms: Pt reports that his pain is on avg 3-4/10.  He is ready to be out of his boot.  Pain Assessment Currently in Pain?: Yes Pain Score: 3  Pain Location: Ankle Pain Orientation: Left Pain Type: Acute pain  Precautions/Restrictions  Restrictions Weight Bearing Restrictions: Yes LLE Weight Bearing: Weight bearing as tolerated LLE Partial Weight Bearing Percentage or Pounds: with CAM boot on  Exercise/Treatments Mobility/Balance        Ankle Stretches   Aerobic Exercises   Machines for Strengthening   Ankle Plyometrics   Ankle Exercises - Standing   Ankle Exercises - Seated Towel Crunch: 3 reps Towel Inversion/Eversion: 5 reps;Limitations Towel Inversion/Eversion Limitations: 2# both direction Marble Pickup: 10 marbles 5 small, 5 large (3 reps) Heel Raises: Limitations Heel Raises Limitations: 30 reps Toe Raise: Limitations Toe Raise Limitations: 30 reps Other Seated Ankle Exercises: Toe Yoga w/TC and VC for proper activation 5 reps both directions after cueing Ankle Exercises - Supine   Ankle Exercises - Sidelying   Manual Therapy Manual Therapy: Myofascial release Myofascial Release: to gastroc and lt ankle region to decrease fascial restrictions with soft tissue massage after to decrease pain and improve mobility.   Physical Therapy Assessment and Plan PT Assessment and Plan Clinical Impression Statement: started with manual techniques to decrease scar tissue and pain.   Added weighted activities to towel exercises to improve ankle strength and prepare for independent gait.  Pt demonstrates improved toe coordinated movements at the end of the session after moderate VC and TC for activaition.  used teach back for additional exercises.  PT Treatment/Interventions: Therapeutic activities;Therapeutic exercise;Balance training;Neuromuscular re-education;Patient/family education;Gait training;Stair training;Functional mobility training;Manual techniques PT Plan: Add weight to towel crunches.     Goals Home Exercise Program Pt/caregiver will Perform Home Exercise Program: Independently PT Goal: Perform Home Exercise Program - Progress: Met PT Short Term Goals Time to Complete Short Term Goals: 4 weeks PT Short Term Goal 1: Pt will report present with minimal fascial restriction ans muscle spasms to incision and gastroc region to report pain less than 5/10 when walking.  PT Short Term Goal 1 - Progress: Progressing toward goal PT Short Term Goal 2: Pt will improve his ankle plantarlfexion to 30 degress to prepare for independent walking in normal shoes to improve toe off during gait.  PT Short Term Goal 2 - Progress: Progressing toward goal PT Short Term Goal 3: Pt will improve his Lt ankle strength by 1 muscle grade to prepare for independent walking in normal shoes.  PT Short Term Goal 3 - Progress: Progressing toward goal PT Long Term Goals Time to Complete Long Term Goals: 8 weeks PT Long Term Goal 1: Pt will improve his ankle strength in order ambulate with mild gait impairments to decrease risk of secondary injury.  PT Long Term Goal 1 - Progress: Progressing toward goal PT Long Term Goal 2: Pt will improve his FOTO to status greater than 52% and limiation less than 48% for improved QOL.  PT Long Term Goal 2 - Progress: Progressing toward goal Long Term Goal 3:  Pt will improve Lt ankle AROM to [facility] in order to ascend and descend steps without increased difficulty.   Long Term Goal 3 Progress: Progressing toward goal  Problem List Patient Active Problem List   Diagnosis Date Noted  . Pain in joint, ankle and foot 01/20/2013  . Left leg weakness 01/20/2013  . CARPAL TUNNEL SYNDROME, HX OF 02/14/2009  . MONONEURITIS OF UNSPECIFIED SITE 11/22/2008  . 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 TENDINITIS, RIGHT WRIST 11/22/2008  . NECK PAIN 08/14/2008    PT - End of Session Activity Tolerance: Patient tolerated treatment well;Patient limited by pain General Behavior During Therapy: [facility] for tasks assessed/performed  GP    Tashiba Timoney 02/01/2013, 8:53 AM

## 2013-02-08 ENCOUNTER — Ambulatory Visit (HOSPITAL_COMMUNITY)
Admission: RE | Admit: 2013-02-08 | Discharge: 2013-02-08 | Disposition: A | Discharge status: Home / Self Care | Payer: Medicare Other | Source: Ambulatory Visit | Attending: Orthopedic Surgery | Admitting: Orthopedic Surgery

## 2013-02-08 DIAGNOSIS — M25473 Effusion, unspecified ankle: Secondary | ICD-10-CM | POA: Insufficient documentation

## 2013-02-08 DIAGNOSIS — IMO0001 Reserved for inherently not codable concepts without codable children: Secondary | ICD-10-CM | POA: Insufficient documentation

## 2013-02-08 DIAGNOSIS — M25579 Pain in unspecified ankle and joints of unspecified foot: Secondary | ICD-10-CM | POA: Insufficient documentation

## 2013-02-08 DIAGNOSIS — M25476 Effusion, unspecified foot: Secondary | ICD-10-CM | POA: Insufficient documentation

## 2013-02-08 DIAGNOSIS — M6281 Muscle weakness (generalized): Secondary | ICD-10-CM | POA: Insufficient documentation

## 2013-02-08 DIAGNOSIS — M25572 Pain in left ankle and joints of left foot: Secondary | ICD-10-CM

## 2013-02-08 DIAGNOSIS — R29898 Other symptoms and signs involving the musculoskeletal system: Secondary | ICD-10-CM

## 2013-02-08 DIAGNOSIS — R262 Difficulty in walking, not elsewhere classified: Secondary | ICD-10-CM | POA: Insufficient documentation

## 2013-02-08 NOTE — Progress Notes (Signed)
Physical Therapy Treatment Patient Details  Name: Joseph Pruitt MRN: [id number] Date of Birth: 1965/12/09  Today's Date: 02/08/2013 Time: 0812-0848 PT Time Calculation (min): 36 min Charge: TE 0812- 0835 Manual [id number]  Visit#: 4 of 8  Re-eval: 02/19/13 Assessment Diagnosis: Rt achilles surgery Surgical Date: 11/05/12 Next MD Visit: Dr.Ostrum - 02/09/13  Authorization: Medicare  Authorization Time Period:    Authorization Visit#: 4 of 10   Subjective: Symptoms/Limitations Symptoms: Pt reports numbness dorsal surface Lt ankle.  Reports pain scale 3/10 today. Pain Assessment Currently in Pain?: Yes Pain Score: 3  Pain Location: Ankle Pain Orientation: Left  Precautions/Restrictions  Restrictions Weight Bearing Restrictions: Yes LLE Weight Bearing: Weight bearing as tolerated LLE Partial Weight Bearing Percentage or Pounds: with CAM boot on   02/08/13 0800  Assessment  Diagnosis Rt achilles surgery  Surgical Date 11/05/12  Next MD Visit Dr.Ostrum - 02/09/13  Restrictions  Weight Bearing Restrictions Yes  LLE Weight Bearing WBAT  LLE Partial Weight Bearing Percentage or Pounds with CAM boot on  LLE AROM (degrees)  Left Ankle Dorsiflexion 10 (was 10)  Left Ankle Plantar Flexion 20 (was 15)  Left Ankle Inversion 23 (was 14)  Left Ankle Eversion 20 (was 0)  LLE Strength  Left Ankle Dorsiflexion 5/5 (was 5/5)  Left Ankle Plantar Flexion 3/5 (was 2/5)  Left Ankle Inversion 3+/5 (was 2/5)  Left Ankle Eversion 3+/5 (was 2/5)     Exercise/Treatments Ankle Stretches Gastroc Stretch: 3 reps;30 seconds;Limitations Gastroc Stretch Limitations: long sitting with towe; Ankle Exercises - Seated Towel Crunch: 3 reps;Weights Towel Crunch Weights (lbs): 3 Towel Inversion/Eversion: 5 reps;Limitations Towel Inversion/Eversion Limitations: 3# both Marble Pickup: 10 marbles 5 small, 5 large Heel Raises: Limitations Heel Raises Limitations: 30 reps Toe Raise:  Limitations Toe Raise Limitations: 30 reps  Physical Therapy Assessment and Plan PT Assessment and Plan Clinical Impression Statement: MMT and ROM measurement complete prior MD apt tomorrow, pt improving strength and increasing AROM.  Able to add weight with towel crunches, inversion and eversion without difficutly.  Manual techniuqes were complete to reduce fascial restrictions and pain.   PT Plan: F/U with MD.  Continue to progress ankle strengthening, ROM and manual techniques to reduce fascial restrictions and pain.      Goals Home Exercise Program Pt/caregiver will Perform Home Exercise Program: Independently PT Short Term Goals Time to Complete Short Term Goals: 4 weeks PT Short Term Goal 1: Pt will report present with minimal fascial restriction ans muscle spasms to incision and gastroc region to report pain less than 5/10 when walking.  PT Short Term Goal 1 - Progress: Progressing toward goal PT Short Term Goal 2: Pt will improve his ankle plantarlfexion to 30 degress to prepare for independent walking in normal shoes to improve toe off during gait.  PT Short Term Goal 2 - Progress: Progressing toward goal PT Short Term Goal 3: Pt will improve his Lt ankle strength by 1 muscle grade to prepare for independent walking in normal shoes.  PT Short Term Goal 3 - Progress: Met PT Long Term Goals Time to Complete Long Term Goals: 8 weeks PT Long Term Goal 1: Pt will improve his ankle strength in order ambulate with mild gait impairments to decrease risk of secondary injury.  PT Long Term Goal 1 - Progress: Progressing toward goal PT Long Term Goal 2: Pt will improve his FOTO to status greater than 52% and limiation less than 48% for improved QOL.  Long Term Goal 3: Pt will  improve Lt ankle AROM to [facility] in order to ascend and descend steps without increased difficulty.   Problem List Patient Active Problem List   Diagnosis Date Noted  . Pain in joint, ankle and foot 01/20/2013  . Left  leg weakness 01/20/2013  . CARPAL TUNNEL SYNDROME, HX OF 02/14/2009  . MONONEURITIS OF UNSPECIFIED SITE 11/22/2008  . 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 TENDINITIS, RIGHT WRIST 11/22/2008  . NECK PAIN 08/14/2008    PT - End of Session Activity Tolerance: Patient tolerated treatment well General Behavior During Therapy: [facility] for tasks assessed/performed  GP    [name] 02/08/2013, 12:09 PM

## 2013-02-10 ENCOUNTER — Ambulatory Visit (HOSPITAL_COMMUNITY): Payer: Medicare Other | Admitting: Physical Therapy

## 2013-02-15 ENCOUNTER — Ambulatory Visit (HOSPITAL_COMMUNITY)
Admission: RE | Admit: 2013-02-15 | Discharge: 2013-02-15 | Disposition: A | Discharge status: Home / Self Care | Payer: Medicare Other | Source: Ambulatory Visit | Attending: *Deleted | Admitting: *Deleted

## 2013-02-15 NOTE — Progress Notes (Addendum)
Physical Therapy Treatment Patient Details  Name: Joseph Pruitt MRN: [id number] Date of Birth: 1965-05-20  Today's Date: 02/15/2013 Time: 0805-0855 PT Time Calculation (min): 50 min Charges: Therex [phone]) Manual x [phone]) Ice x [phone])  Visit#: 5 of 8  Re-eval: 02/19/13  Authorization: Medicare  Authorization Visit#: 5 of 10   Subjective: Symptoms/Limitations Symptoms: Pt states that MD has given permission to wean from CAM walker. Pain Assessment Currently in Pain?: Yes Pain Score: 2  Pain Location: Ankle Pain Orientation: Left   Exercise/Treatments Ankle Exercises - Standing SLS: LLE 2x30" with 1 HHA Rocker Board: 2 minutes;Limitations Rocker Board Limitations: R/L A/P Heel Raises:  (Unable) Toe Raise: 10 reps Other Standing Ankle Exercises: Tandem gait 2 RT Ankle Exercises - Seated Towel Inversion/Eversion: 5 reps;Limitations Towel Inversion/Eversion Limitations: 5# both  Manual  MFR completed throughout left ankle along with grade I-II joint mobs to improve motion PROM all directions  Modalities Ice applied to left ankle to limit pain and inflammation x 10'   Physical Therapy Assessment and Plan PT Assessment and Plan Clinical Impression Statement: Pt completes seated exercises well with minimal need for cueing. Progressed to standing exercises as requested that pt wean from CAM walker. Manual techniques completed to ankle with focus on anterior ankle to improve plantar flexion. Ice applied at end of session to limit pain and inflammation. PT Plan:  Continue to progress ankle strengthening, ROM and manual techniques to reduce fascial restrictions and pain. Progress standing exercises as pt is to wean from CAM walker, per MD.    Problem List Patient Active Problem List   Diagnosis Date Noted  . Pain in joint, ankle and foot 01/20/2013  . Left leg weakness 01/20/2013  . CARPAL TUNNEL SYNDROME, HX OF 02/14/2009  . MONONEURITIS OF  UNSPECIFIED SITE 11/22/2008  . 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 TENDINITIS, RIGHT WRIST 11/22/2008  . NECK PAIN 08/14/2008    PT - End of Session Activity Tolerance: Patient tolerated treatment well General Behavior During Therapy: [facility] for tasks assessed/performed  Seth Bake, PTA  02/15/2013, 8:56 AM

## 2013-02-17 ENCOUNTER — Ambulatory Visit (HOSPITAL_COMMUNITY)
Admission: RE | Admit: 2013-02-17 | Discharge: 2013-02-17 | Disposition: A | Discharge status: Home / Self Care | Payer: Medicare Other | Source: Ambulatory Visit

## 2013-02-17 DIAGNOSIS — R29898 Other symptoms and signs involving the musculoskeletal system: Secondary | ICD-10-CM

## 2013-02-17 DIAGNOSIS — M25572 Pain in left ankle and joints of left foot: Secondary | ICD-10-CM

## 2013-02-17 NOTE — Progress Notes (Signed)
Physical Therapy Treatment Patient Details  Name: Joseph Pruitt MRN: [id number] Date of Birth: March 16, 1965  Today's Date: 02/17/2013 Time: 9562-1308 PT Time Calculation (min): 40 min Charge:TE [id number], Manual [id number], Ice [phone]  Visit#: 6 of 8  Re-eval: 02/19/13 Assessment Diagnosis: Rt achilles surgery Surgical Date: 11/05/12 Next MD Visit: Dr.Ostrum - January- 6 weeks from 02/09/2013  Authorization: Medicare  Authorization Time Period:    Authorization Visit#: 6 of 10   Subjective: Symptoms/Limitations Symptoms: Pt stated it felt good  to get out of boot, reports going around house without CAM boot. Pain Assessment Currently in Pain?: Yes Pain Score: 2  Pain Location: Ankle Pain Orientation: Left  Precautions/Restrictions  Restrictions Weight Bearing Restrictions: Yes LLE Weight Bearing: Weight bearing as tolerated LLE Partial Weight Bearing Percentage or Pounds: with CAM boot on  Exercise/Treatments Ankle Exercises - Standing SLS: LLE 2x30" with 1 HHA Rocker Board: 2 minutes;Limitations Rocker Board Limitations: R/L A/P Heel Raises: 10 reps;Limitations Heel Raises Limitations: reports pain in toes Toe Raise: 10 reps Ankle Exercises - Seated Towel Inversion/Eversion: 5 reps;Limitations Towel Inversion/Eversion Limitations: 5# both Heel Raises: Limitations Heel Raises Limitations: 30 reps Toe Raise: Limitations Toe Raise Limitations: 30 reps Modalities Modalities: Cryotherapy Manual Therapy Manual Therapy: Myofascial release Myofascial Release: MFR to lateral portion of scar tissue to reduce fascial restriction with soft tissue nassage ti gastric and achilles tendon to decrease tightness and pain.   Cryotherapy Number Minutes Cryotherapy: 10 Minutes Cryotherapy Location: Ankle Type of Cryotherapy: Ice pack  Physical Therapy Assessment and Plan PT Assessment and Plan Clinical Impression Statement: Continued with standing exercises to progress  towards weaning from CAM boot.  Pt able to complete heel raises for gastroc strengthening this session, did report pain on toes with movement..  Manual techniques complete to reduce fascial restrictions on lateral incision and soft tissue massage complete to gastroc region to reduce tightness and pain.   PT Plan:  Continue to progress ankle strengthening, ROM and manual techniques to reduce fascial restrictions and pain. Progress standing exercises as pt is to wean from CAM walker, per MD.    Goals Home Exercise Program Pt/caregiver will Perform Home Exercise Program: Independently PT Short Term Goals Time to Complete Short Term Goals: 4 weeks PT Short Term Goal 1: Pt will report present with minimal fascial restriction ans muscle spasms to incision and gastroc region to report pain less than 5/10 when walking.  PT Short Term Goal 1 - Progress: Progressing toward goal PT Short Term Goal 2: Pt will improve his ankle plantarlfexion to 30 degress to prepare for independent walking in normal shoes to improve toe off during gait.  PT Short Term Goal 2 - Progress: Progressing toward goal PT Short Term Goal 3: Pt will improve his Lt ankle strength by 1 muscle grade to prepare for independent walking in normal shoes.  PT Long Term Goals Time to Complete Long Term Goals: 8 weeks PT Long Term Goal 1: Pt will improve his ankle strength in order ambulate with mild gait impairments to decrease risk of secondary injury.  PT Long Term Goal 2: Pt will improve his FOTO to status greater than 52% and limiation less than 48% for improved QOL.  Long Term Goal 3: Pt will improve Lt ankle AROM to [facility] in order to ascend and descend steps without increased difficulty.   Problem List Patient Active Problem List   Diagnosis Date Noted  . Pain in joint, ankle and foot 01/20/2013  . Left leg weakness 01/20/2013  .  CARPAL TUNNEL SYNDROME, HX OF 02/14/2009  . MONONEURITIS OF UNSPECIFIED SITE 11/22/2008  . SPONDYLOSIS,  CERVICAL, WITH RADICULOPATHY 11/22/2008  . TENDINITIS, RIGHT WRIST 11/22/2008  . NECK PAIN 08/14/2008    PT - End of Session Activity Tolerance: Patient tolerated treatment well General Behavior During Therapy: [facility] for tasks assessed/performed  GP    [name] 02/17/2013, 9:28 AM

## 2013-02-22 ENCOUNTER — Ambulatory Visit (HOSPITAL_COMMUNITY)
Admission: RE | Admit: 2013-02-22 | Discharge: 2013-02-22 | Disposition: A | Discharge status: Home / Self Care | Payer: Medicare Other | Source: Ambulatory Visit | Attending: *Deleted | Admitting: *Deleted

## 2013-02-22 NOTE — Progress Notes (Signed)
Physical Therapy Treatment Patient Details  Name: Joseph Pruitt MRN: [id number] Date of Birth: 11-23-1965  Today's Date: 02/22/2013 Time: 0806-0858 PT Time Calculation (min): 52 min Visit#: 7 of 8  Re-eval: 02/19/13 Authorization: Medicare  Authorization Visit#: 7 of 10  Charges:  therex [phone] (26'), MFR [phone] (12'), icepack [phone] (10')  Subjective: Symptoms/Limitations Symptoms: Pt states is swole alot yesterday but was up on his foot alot. States he's not wearing his boot in his house and went to church Sunday without it on.  Currently 4/10 pain. Pain Assessment Currently in Pain?: Yes Pain Score: 4  Pain Location: Ankle Pain Orientation: Left   Exercise/Treatments Ankle Exercises - Standing SLS: LLE 2x30" with 1 HHA Rocker Board: 2 minutes;Limitations Rocker Board Limitations: R/L A/P Heel Raises: 15 reps Toe Raise: 15 reps Other Standing Ankle Exercises: Tandem gait 2 RT Ankle Exercises - Sidelying Ankle Inversion: 10 reps;Weights Ankle Inversion Weights (lbs): 5# Ankle Eversion: 10 reps;Weights Ankle Eversion Weights (lbs): 5# Modalities Modalities: Cryotherapy Manual Therapy Manual Therapy: Myofascial release Myofascial Release: MFR to surround scar tissue to reduce fascial restriction with STM to heel/gastroc and achilles tendon to decrease tightness and pain in prone f/b ice with elevation Cryotherapy Number Minutes Cryotherapy: 10 Minutes Cryotherapy Location: Ankle Type of Cryotherapy: Ice pack  Physical Therapy Assessment and Plan PT Assessment and Plan Clinical Impression Statement: Pt able to complete exercises with some discomfort in standing.  Unable to attempt SLS on Lt without use of UE's due to pain.  Added sidelying ankle inversion/eversion with 5# weight to increase strength.  Pt with extreme sensitivity over distal scar area and unable to tolerate deeper MFR in this area.  Ended session with icepack. PT Plan: Re-evaluate next visit.      Problem List Patient Active Problem List   Diagnosis Date Noted  . Pain in joint, ankle and foot 01/20/2013  . Left leg weakness 01/20/2013  . CARPAL TUNNEL SYNDROME, HX OF 02/14/2009  . MONONEURITIS OF UNSPECIFIED SITE 11/22/2008  . 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 TENDINITIS, RIGHT WRIST 11/22/2008  . NECK PAIN 08/14/2008    PT - End of Session Activity Tolerance: Patient tolerated treatment well General Behavior During Therapy: [facility] for tasks assessed/performed   Lurena Nida, PTA/CLT 02/22/2013, 8:55 AM

## 2013-02-24 ENCOUNTER — Ambulatory Visit (HOSPITAL_COMMUNITY)
Admission: RE | Admit: 2013-02-24 | Discharge: 2013-02-24 | Disposition: A | Discharge status: Home / Self Care | Payer: Medicare Other | Source: Ambulatory Visit | Attending: Physical Therapy | Admitting: Physical Therapy

## 2013-02-24 DIAGNOSIS — R29898 Other symptoms and signs involving the musculoskeletal system: Secondary | ICD-10-CM

## 2013-02-24 DIAGNOSIS — M25572 Pain in left ankle and joints of left foot: Secondary | ICD-10-CM

## 2013-02-24 NOTE — Evaluation (Addendum)
Physical Therapy Re-Evaluation/Treatment  Patient Details  Name: Joseph Pruitt MRN: [id number] Date of Birth: 17-Feb-1966  Today's Date: 02/24/2013 Time: 4098-1191 PT Time Calculation (min): 42 min Charges:  ROM/MMT: [phone] Self Care: [phone] TE: [phone]             Visit#: 8 of 16  Re-eval: 02/19/13 Assessment Diagnosis: Rt achilles surgery Surgical Date: 11/05/12 Next MD Visit: Julieta Bellini - January- 03/23/13  Authorization: Medicare    Authorization Time Period:    Authorization Visit#: 8 of 18   Past Medical History:  Past Medical History  Diagnosis Date  . Stomach ulcer    Past Surgical History:  Past Surgical History  Procedure Laterality Date  . Spine surgery    . Hand surgery      Subjective Symptoms/Limitations Symptoms: he reports that he is slowly weaning out of his boot.  He is walking about 30 minutes - 1 hour without it.  He is standing about 30 minutes without it. reports that after about 1 hour of walking he has some tingling to his ankle.  Pain Assessment Currently in Pain?: Yes Pain Score: 2  Pain Location: Ankle Pain Orientation: Left  Sensation/Coordination/Flexibility/Functional Tests Functional Tests Functional Tests: FOTO: Status: 35, Imitation: 65 (was 28/72)  Assessment LLE AROM (degrees) Left Ankle Dorsiflexion: 15 (was 10) Left Ankle Plantar Flexion: 40 (was 20) Left Ankle Inversion: 30 (was 23) Left Ankle Eversion: 25 (was 20) LLE Strength Left Ankle Dorsiflexion: 5/5 (was 5/5) Left Ankle Plantar Flexion: 3+/5 (was 3/5) Left Ankle Inversion: 4/5 (was 3+/5) Left Ankle Eversion: 4/5 (was 3+/5) Palpation Palpation: moderate fascial restrictions over scar tissue and is able to tolerate moderate pressure to ankle region  Mobility/Balance  Ambulation/Gait Ambulation/Gait: Yes Gait Pattern: Antalgic;Left foot flat Static Standing Balance Single Leg Stance - Right Leg: 30 Single Leg Stance - Left Leg: 11 Tandem Stance - Right Leg:  30 Tandem Stance - Left Leg: 13   Exercise/Treatments Ankle Exercises - Standing SLS: LLE 3x30 sec holds intermittent HHA Heel Raises: 20 reps Other Standing Ankle Exercises: Stair training: 2 inch step: Step ups x10, lateral step ups x10, step downs x10 Ankle Exercises - Supine T-Band: Blue theraband 4 way x15 reps    Physical Therapy Assessment and Plan PT Assessment and Plan Clinical Impression Statement: Joseph Pruitt has attended 8 OP PT visits over the past 4 weeks s/p achilles repair with following findings: has significant improvement with AROM and moderate improvements with his strength.  Mostly limiited by fear and pain with plantar flexion which has decreased strength.  At this time is slowly weaning from the boot and have encouraged to continue with adding 10 minutes out of the boot a day.  Pt will benefit from skilled therapeutic intervention in order to improve on the following deficits: Pain;Decreased strength;Difficulty walking;Decreased coordination;Decreased mobility;Increased fascial restricitons;Increased muscle spasms;Impaired perceived functional ability;Impaired flexibility;Decreased balance Rehab Potential: Good PT Frequency: Min 2X/week PT Duration: 4 weeks PT Treatment/Interventions: Therapeutic activities;Therapeutic exercise;Balance training;Neuromuscular re-education;Patient/family education;Gait training;Stair training;Functional mobility training;Manual techniques PT Plan: Continue with strengthening plantar flexion to gastroc and solues. manual techniques as needed.     Goals Home Exercise Program Pt/caregiver will Perform Home Exercise Program: Independently PT Goal: Perform Home Exercise Program - Progress: Met PT Short Term Goals Time to Complete Short Term Goals: 4 weeks PT Short Term Goal 1: Pt will report present with minimal fascial restriction ans muscle spasms to incision and gastroc region to report pain less than 5/10 when walking.  (Continues to  have  fascial restrictions) PT Short Term Goal 1 - Progress: Partly met PT Short Term Goal 2: Pt will improve his ankle plantarlfexion to 30 degress to prepare for independent walking in normal shoes to improve toe off during gait.  PT Short Term Goal 3: Pt will improve his Lt ankle strength by 1 muscle grade to prepare for independent walking in normal shoes.  PT Short Term Goal 3 - Progress: Met PT Long Term Goals Time to Complete Long Term Goals: 8 weeks PT Long Term Goal 1: Pt will improve his ankle strength in order ambulate with mild gait impairments to decrease risk of secondary injury.  PT Long Term Goal 1 - Progress: Progressing toward goal PT Long Term Goal 2: Pt will improve his FOTO to status greater than 52% and limiation less than 48% for improved QOL.  Long Term Goal 3: Pt will improve Lt ankle AROM to [facility] in order to ascend and descend steps without increased difficulty.   Problem List Patient Active Problem List   Diagnosis Date Noted  . Pain in joint, ankle and foot 01/20/2013  . Left leg weakness 01/20/2013  . CARPAL TUNNEL SYNDROME, HX OF 02/14/2009  . MONONEURITIS OF UNSPECIFIED SITE 11/22/2008  . 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 TENDINITIS, RIGHT WRIST 11/22/2008  . NECK PAIN 08/14/2008    PT - End of Session Activity Tolerance: Patient tolerated treatment well General Behavior During Therapy: WFL for tasks assessed/performed PT Plan of Care PT Home Exercise Plan: updated and provided blue theraband PT Patient Instructions: FOTO, importance of HEP, discussed updated HEP, weaning from boot.  Consulted and Agree with Plan of Care: Patient  GP Functional Assessment Tool Used: FOTO: 35/65 Functional Limitation: Mobility: Walking and moving around Mobility: Walking and Moving Around Current Status (W0981): At least 40 percent but less than 60 percent impaired, limited or restricted Mobility: Walking and Moving Around Goal Status [phone]): At least  40 percent but less than 60 percent impaired, limited or restricted  Joseph Pruitt, MPT, ATC 02/24/2013, 10:11 AM  Physician Documentation Your signature is required to indicate approval of the treatment plan as stated above.  Please sign and either send electronically or make a copy of this report for your files and return this physician signed original.   Please mark one 1.__approve of plan  2. ___approve of plan with the following conditions.   ______________________________                                                          _____________________ Physician Signature                                                                                                             Date

## 2013-02-28 ENCOUNTER — Ambulatory Visit (HOSPITAL_COMMUNITY)
Admission: RE | Admit: 2013-02-28 | Discharge: 2013-02-28 | Disposition: A | Discharge status: Home / Self Care | Payer: Medicare Other | Source: Ambulatory Visit | Attending: *Deleted | Admitting: *Deleted

## 2013-02-28 ENCOUNTER — Ambulatory Visit (HOSPITAL_COMMUNITY): Payer: Medicare Other

## 2013-02-28 DIAGNOSIS — R29898 Other symptoms and signs involving the musculoskeletal system: Secondary | ICD-10-CM

## 2013-02-28 DIAGNOSIS — M25572 Pain in left ankle and joints of left foot: Secondary | ICD-10-CM

## 2013-02-28 NOTE — Progress Notes (Signed)
Physical Therapy Treatment Patient Details  Name: Joseph Pruitt MRN: [id number] Date of Birth: May 29, 1965  Today's Date: 02/28/2013 Time: 0800-0845 PT Time Calculation (min): 45 min Charge: TE [id number], Manual [phone]  Visit#: 9 of 16  Re-eval: 03/24/12 Assessment Diagnosis: Rt achilles surgery Surgical Date: 11/05/12 Next MD Visit: Dr.Ostrum - January- 03/23/13  Authorization: Medicare  Authorization Time Period: GCode complete on 8th visit  Authorization Visit#: 9 of 18   Subjective: Symptoms/Limitations Symptoms: Pt stated he went all day yesterday without putting on his boot, reports a little sore but has completed exercises and stretches this morning which have helped. Pain scale 2-3/10 Pain Assessment Currently in Pain?: Yes Pain Score: 3  Pain Location: Ankle Pain Orientation: Left  Objective:  Exercise/Treatments Ankle Stretches Slant Board Stretch: 3 reps;30 seconds Ankle Exercises - Standing SLS: 26" max of 3 Rocker Board: 3 minutes;Limitations Rocker Board Limitations: R/L A/P, last minute no HHA Heel Raises: 20 reps Toe Raise: 20 reps Other Standing Ankle Exercises: Stair training: 4 inch step: lateral step ups x15, step downs x15, 2 in Ankle Exercises - Supine T-Band: Blue theraband 4 way x15 reps Ankle Exercises - Sidelying   Manual Therapy Manual Therapy: Myofascial release Myofascial Release: MFR to surround scar tissue to reduce fascial restriction with STM to heel/gastroc and achilles tendon to decrease tightness and pain in prone f/b ice with elevation  Physical Therapy Assessment and Plan PT Assessment and Plan Clinical Impression Statement: Continued functioanl strengthening for ankle strengthening, able to increase to 4 in step with lateral and forward stepping up.  Balance is improving, able to complete SLS without HHA this sessoin.  Manual techniques complete to decrease fascial restricitons achilles tendon and gastroc region PT Plan:  Continue with strengthening plantar flexion to gastroc and solues. manual techniques as needed.     Goals Home Exercise Program Pt/caregiver will Perform Home Exercise Program: Independently PT Short Term Goals Time to Complete Short Term Goals: 4 weeks PT Short Term Goal 1: Pt will report present with minimal fascial restriction ans muscle spasms to incision and gastroc region to report pain less than 5/10 when walking.  (Continues to have fascial restrictions) PT Short Term Goal 1 - Progress: Progressing toward goal PT Short Term Goal 2: Pt will improve his ankle plantarlfexion to 30 degress to prepare for independent walking in normal shoes to improve toe off during gait.  PT Short Term Goal 2 - Progress: Progressing toward goal PT Short Term Goal 3: Pt will improve his Lt ankle strength by 1 muscle grade to prepare for independent walking in normal shoes.  PT Long Term Goals Time to Complete Long Term Goals: 8 weeks PT Long Term Goal 1: Pt will improve his ankle strength in order ambulate with mild gait impairments to decrease risk of secondary injury.  PT Long Term Goal 1 - Progress: Progressing toward goal PT Long Term Goal 2: Pt will improve his FOTO to status greater than 52% and limiation less than 48% for improved QOL.  Long Term Goal 3: Pt will improve Lt ankle AROM to [facility] in order to ascend and descend steps without increased difficulty.   Problem List Patient Active Problem List   Diagnosis Date Noted  . Pain in joint, ankle and foot 01/20/2013  . Left leg weakness 01/20/2013  . CARPAL TUNNEL SYNDROME, HX OF 02/14/2009  . MONONEURITIS OF UNSPECIFIED SITE 11/22/2008  . 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 TENDINITIS, RIGHT WRIST 11/22/2008  . NECK PAIN 08/14/2008  PT - End of Session Activity Tolerance: Patient tolerated treatment well General Behavior During Therapy: [facility] for tasks assessed/performed  GP    [name] 02/28/2013, 11:28  AM

## 2013-03-01 ENCOUNTER — Ambulatory Visit (HOSPITAL_COMMUNITY): Payer: Medicare Other

## 2013-03-02 ENCOUNTER — Ambulatory Visit (HOSPITAL_COMMUNITY)
Admission: RE | Admit: 2013-03-02 | Discharge: 2013-03-02 | Disposition: A | Discharge status: Home / Self Care | Payer: Medicare Other | Source: Ambulatory Visit | Attending: *Deleted | Admitting: *Deleted

## 2013-03-02 ENCOUNTER — Ambulatory Visit (HOSPITAL_COMMUNITY): Payer: Medicare Other

## 2013-03-02 DIAGNOSIS — M25572 Pain in left ankle and joints of left foot: Secondary | ICD-10-CM

## 2013-03-02 DIAGNOSIS — R29898 Other symptoms and signs involving the musculoskeletal system: Secondary | ICD-10-CM

## 2013-03-02 NOTE — Progress Notes (Signed)
Physical Therapy Treatment Patient Details  Name: Joseph Pruitt MRN: [id number] Date of Birth: 1965-12-01  Today's Date: 03/02/2013 Time: 7829-5621 PT Time Calculation (min): 20 min Charge: TE [id number], Manual [phone]  Visit#: 10 of 16  Re-eval: 03/24/12 Assessment Diagnosis: Rt achilles surgery Surgical Date: 11/05/12 Next MD Visit: Dr.Ostrum - January- 03/23/13  Authorization: Medicare  Authorization Time Period: GCode complete on 8th visit  Authorization Visit#: 10 of 18   Subjective: Symptoms/Limitations Symptoms: Pt reported increased pain after standing for long period of time last night, stated Lt foot went to sleep.  Pt entered wearing CAM boot, forgot shoes in vehicle.  Current pain scale 3/10. Pain Assessment Currently in Pain?: Yes Pain Score: 3  Pain Location: Ankle Pain Orientation: Left  Objective:   Exercise/Treatments Ankle Stretches Slant Board Stretch: 3 reps;30 seconds Ankle Exercises - Supine T-Band: Blue theraband 4 way x20 reps    Manual Therapy Manual Therapy: Myofascial release Myofascial Release: MFR to surround scar tissue to reduce fascial restriction with STM to heel/gastroc and achilles tendon to decrease tightness and pain in prone f/b ice with elevation  Physical Therapy Assessment and Plan PT Assessment and Plan Clinical Impression Statement: Treatment limited by time, pt 28 minutes late for apt.  No standing exercises complete due to pt wearing CAM boot.  Increased reps with theraband with 5-10" holds for ankle strengthening.  Manual techniques complete at end of session to reduce fascial restrictions. PT Plan: Continue with strengthening plantar flexion to gastroc and solues. manual techniques as needed.     Goals Home Exercise Program Pt/caregiver will Perform Home Exercise Program: Independently PT Short Term Goals Time to Complete Short Term Goals: 4 weeks PT Short Term Goal 1: Pt will report present with minimal fascial  restriction ans muscle spasms to incision and gastroc region to report pain less than 5/10 when walking.  (Continues to have fascial restrictions) PT Short Term Goal 1 - Progress: Progressing toward goal PT Short Term Goal 2: Pt will improve his ankle plantarlfexion to 30 degress to prepare for independent walking in normal shoes to improve toe off during gait.  PT Short Term Goal 2 - Progress: Progressing toward goal PT Short Term Goal 3: Pt will improve his Lt ankle strength by 1 muscle grade to prepare for independent walking in normal shoes.  PT Long Term Goals Time to Complete Long Term Goals: 8 weeks PT Long Term Goal 1: Pt will improve his ankle strength in order ambulate with mild gait impairments to decrease risk of secondary injury.  PT Long Term Goal 1 - Progress: Progressing toward goal PT Long Term Goal 2: Pt will improve his FOTO to status greater than 52% and limiation less than 48% for improved QOL.  Long Term Goal 3: Pt will improve Lt ankle AROM to [facility] in order to ascend and descend steps without increased difficulty.   Problem List Patient Active Problem List   Diagnosis Date Noted  . Pain in joint, ankle and foot 01/20/2013  . Left leg weakness 01/20/2013  . CARPAL TUNNEL SYNDROME, HX OF 02/14/2009  . MONONEURITIS OF UNSPECIFIED SITE 11/22/2008  . 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 TENDINITIS, RIGHT WRIST 11/22/2008  . NECK PAIN 08/14/2008    PT - End of Session Activity Tolerance: Patient tolerated treatment well General Behavior During Therapy: [facility] for tasks assessed/performed  GP    [name] 03/02/2013, 10:04 AM

## 2013-03-07 ENCOUNTER — Ambulatory Visit (HOSPITAL_COMMUNITY): Payer: Medicare Other

## 2013-03-07 ENCOUNTER — Inpatient Hospital Stay (HOSPITAL_COMMUNITY): Admission: RE | Admit: 2013-03-07 | Payer: Medicare Other | Source: Ambulatory Visit

## 2013-03-08 ENCOUNTER — Ambulatory Visit (HOSPITAL_COMMUNITY): Payer: Medicare Other | Admitting: Physical Therapy

## 2013-03-08 ENCOUNTER — Ambulatory Visit (HOSPITAL_COMMUNITY): Payer: Medicare Other

## 2013-03-09 ENCOUNTER — Ambulatory Visit (HOSPITAL_COMMUNITY): Payer: Medicare Other

## 2013-03-15 ENCOUNTER — Ambulatory Visit (HOSPITAL_COMMUNITY)
Admission: RE | Admit: 2013-03-15 | Discharge: 2013-03-15 | Disposition: A | Discharge status: Home / Self Care | Payer: Medicare Other | Source: Ambulatory Visit | Attending: Orthopedic Surgery | Admitting: Orthopedic Surgery

## 2013-03-15 DIAGNOSIS — R262 Difficulty in walking, not elsewhere classified: Secondary | ICD-10-CM | POA: Insufficient documentation

## 2013-03-15 DIAGNOSIS — R29898 Other symptoms and signs involving the musculoskeletal system: Secondary | ICD-10-CM

## 2013-03-15 DIAGNOSIS — M25473 Effusion, unspecified ankle: Secondary | ICD-10-CM | POA: Insufficient documentation

## 2013-03-15 DIAGNOSIS — M25579 Pain in unspecified ankle and joints of unspecified foot: Secondary | ICD-10-CM | POA: Insufficient documentation

## 2013-03-15 DIAGNOSIS — M25572 Pain in left ankle and joints of left foot: Secondary | ICD-10-CM

## 2013-03-15 DIAGNOSIS — M25476 Effusion, unspecified foot: Secondary | ICD-10-CM | POA: Insufficient documentation

## 2013-03-15 DIAGNOSIS — IMO0001 Reserved for inherently not codable concepts without codable children: Secondary | ICD-10-CM | POA: Insufficient documentation

## 2013-03-15 DIAGNOSIS — M6281 Muscle weakness (generalized): Secondary | ICD-10-CM | POA: Insufficient documentation

## 2013-03-15 NOTE — Progress Notes (Signed)
Physical Therapy Treatment Patient Details  Name: Joseph Pruitt MRN: [id number] Date of Birth: 01-23-1966  Today's Date: 03/15/2013 Time: 9242-6834 PT Time Calculation (min): 23 min Charges: manual: 23' Visit#: 11 of 16  Re-eval: 03/24/12    Authorization: Medicare  Authorization Time Period: GCode complete on 8th visit  Authorization Visit#: 12 of 18   Subjective: Symptoms/Limitations Symptoms: Pt states that he keeps waking up late.  Discussed with pt to change time if needed.  He reports that his leg is sore, he is wearing his boots all the time.  Pain Assessment Currently in Pain?: Yes Pain Score: 5  Pain Location: Ankle Pain Orientation: Left  Precautions/Restrictions     Exercise/Treatments  Manual Therapy Manual Therapy: Myofascial release Myofascial Release: MFR to surround scar tissue to reduce fascial restriction with STM to heel/gastroc and achilles tendon to decrease tightness and pain in prone   Physical Therapy Assessment and Plan PT Assessment and Plan Clinical Impression Statement: Pt is 28 minutes late for apt today.  PT Plan: Continue with strengthening plantar flexion to gastroc and solues. manual techniques as needed.     Goals    Problem List Patient Active Problem List   Diagnosis Date Noted  . Pain in joint, ankle and foot 01/20/2013  . Left leg weakness 01/20/2013  . CARPAL TUNNEL SYNDROME, HX OF 02/14/2009  . MONONEURITIS OF UNSPECIFIED SITE 11/22/2008  . 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 TENDINITIS, RIGHT WRIST 11/22/2008  . NECK PAIN 08/14/2008    PT - End of Session Activity Tolerance: Patient tolerated treatment well General Behavior During Therapy: [facility] for tasks assessed/performed  GP    Quenten Nawaz, MPT, ATC 03/15/2013, 9:08 AM

## 2013-03-17 ENCOUNTER — Ambulatory Visit (HOSPITAL_COMMUNITY)
Admission: RE | Admit: 2013-03-17 | Discharge: 2013-03-17 | Disposition: A | Discharge status: Home / Self Care | Payer: Medicare Other | Source: Ambulatory Visit | Attending: Orthopedic Surgery | Admitting: Orthopedic Surgery

## 2013-03-17 DIAGNOSIS — R29898 Other symptoms and signs involving the musculoskeletal system: Secondary | ICD-10-CM

## 2013-03-17 DIAGNOSIS — M25572 Pain in left ankle and joints of left foot: Secondary | ICD-10-CM

## 2013-03-17 NOTE — Progress Notes (Signed)
Physical Therapy Treatment Patient Details  Name: Joseph Pruitt MRN: [id number] Date of Birth: 02-21-1966  Today's Date: 03/17/2013 Time: 7035-0093 PT Time Calculation (min): 40 min Charge: TE [id number], Manual [id number]  Visit#: 12 of 16  Re-eval: 03/24/13 (MD apt 03/23/13) Assessment Diagnosis: Rt achilles surgery Surgical Date: 11/05/12 Next MD Visit: Dr.Ostrum - January- 03/23/13  Authorization: Medicare  Authorization Time Period: GCode complete on 8th visit  Authorization Visit#: 12 of 18   Subjective: Symptoms/Limitations Symptoms: Pt entered dept wearing tennis shoes, pt reports CAM boots do help with pain and plans to put them on later today. Pain Assessment Currently in Pain?: Yes Pain Score: 4  Pain Location: Ankle Pain Orientation: Left  Objective:   Exercise/Treatments Ankle Stretches Slant Board Stretch: 3 reps;30 seconds Ankle Exercises - Standing SLS: 31" max of 3 Rocker Board: 3 minutes;Limitations Rocker Board Limitations: R/L A/P, last minute no HHA Heel Raises: 20 reps Toe Raise: 20 reps Other Standing Ankle Exercises: Stair training: 6 inch step: lateral step ups x15, step downs x15, 2 in  Physical Therapy Assessment and Plan PT Assessment and Plan Clinical Impression Statement: Pt progressed to 6in step height with forward and lateral step ups and continued gastrocnemius strengthening.  Continued manual techniques to reduce fascial restrictions around incision and tightness to gastroc and soleus region.  Pt stated pain reduced at end of session. PT Plan: Continue with strengthening plantar flexion to gastroc and solues. manual techniques as needed.  Begin cybex leg press for plantar flexion strengthening next session.    Goals Home Exercise Program Pt/caregiver will Perform Home Exercise Program: Independently PT Short Term Goals Time to Complete Short Term Goals: 4 weeks PT Short Term Goal 1: Pt will report present with minimal fascial  restriction ans muscle spasms to incision and gastroc region to report pain less than 5/10 when walking.  (Continues to have fascial restrictions) PT Short Term Goal 1 - Progress: Progressing toward goal PT Short Term Goal 2: Pt will improve his ankle plantarlfexion to 30 degress to prepare for independent walking in normal shoes to improve toe off during gait.  PT Short Term Goal 2 - Progress: Progressing toward goal PT Short Term Goal 3: Pt will improve his Lt ankle strength by 1 muscle grade to prepare for independent walking in normal shoes.  PT Long Term Goals Time to Complete Long Term Goals: 8 weeks PT Long Term Goal 1: Pt will improve his ankle strength in order ambulate with mild gait impairments to decrease risk of secondary injury.  PT Long Term Goal 1 - Progress: Progressing toward goal PT Long Term Goal 2: Pt will improve his FOTO to status greater than 52% and limiation less than 48% for improved QOL.  Long Term Goal 3: Pt will improve Lt ankle AROM to [facility] in order to ascend and descend steps without increased difficulty.   Problem List Patient Active Problem List   Diagnosis Date Noted  . Pain in joint, ankle and foot 01/20/2013  . Left leg weakness 01/20/2013  . CARPAL TUNNEL SYNDROME, HX OF 02/14/2009  . MONONEURITIS OF UNSPECIFIED SITE 11/22/2008  . 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 TENDINITIS, RIGHT WRIST 11/22/2008  . NECK PAIN 08/14/2008    PT - End of Session Activity Tolerance: Patient tolerated treatment well General Behavior During Therapy: [facility] for tasks assessed/performed  GP    [name] 03/17/2013, 10:30 AM

## 2013-03-22 ENCOUNTER — Ambulatory Visit (HOSPITAL_COMMUNITY)
Admission: RE | Admit: 2013-03-22 | Discharge: 2013-03-22 | Disposition: A | Discharge status: Home / Self Care | Payer: Medicare Other | Source: Ambulatory Visit | Attending: Orthopedic Surgery | Admitting: Orthopedic Surgery

## 2013-03-22 NOTE — Progress Notes (Signed)
Physical Therapy Re-evaluation  Patient Details  Name: Joseph Pruitt MRN: [id number] Date of Birth: Sep 20, 1965  Today's Date: 03/22/2013 Time: 5956-3875 PT Time Calculation (min): 46 min              Visit#: 13 of 21  Re-eval: 04/19/13 Diagnosis: Lt achilles surgery Surgical Date: 11/05/12 Next MD Visit: Dr.Ostrum - January- 03/23/13 Charges:  therex [phone] (24'), manual [phone] (10'), ROM/MMT testing [phone] (8') Authorization: Medicare    Authorization Time Period: GCode complete on 8th visit  Authorization Visit#: 13 of 18    Subjective Pt states he's improving, however still having more pain than he thinks he should have.  States he returns to his MD tomorrow.  Objective:  FOTO:  Functional status 41% (was 35%), limitations 59% (was 65%)  LLE AROM (degrees) Left Ankle Dorsiflexion: 20 (was 15 degrees) Left Ankle Plantar Flexion: 40 (was 40 degrees) Left Ankle Inversion: 35 (was 30 degrees) Left Ankle Eversion: 25 (was 25 degrees)  LLE Strength Left Ankle Dorsiflexion: 5/5 (was 5/5) Left Ankle Plantar Flexion: 4/5 (was 3+/5) Left Ankle Inversion: 5/5 (was 4/5) Left Ankle Eversion: 5/5 (was 4/5)  Exercise/Treatments Mobility/Balance  Static Standing Balance Single Leg Stance - Right Leg: 60 (was 30 seconds) Single Leg Stance - Left Leg: 60 (was 11 seconds)  Ankle Stretches Slant Board Stretch: 3 reps;30 seconds Machines for Strengthening Cybex Leg Press: 4PL, Lt only PF/DF 15 reps Ankle Exercises - Standing SLS: 31" max of 3 Rocker Board: 3 minutes;Limitations Rocker Board Limitations: R/L A/P, last minute no HHA Heel Raises: 20 reps Toe Raise: 20 reps  Manual:  MFR to achilles and gastroc, scar massage and dorsum of foot    Physical Therapy Assessment and Plan Clinical Impression Statement:  Pt has completed 13 visits for Lt achilles surgery.  He has met all goals except 1/3 LTG.  His ROM and strength are now [facility].  Pt only with antalgic gait after  increased activity/pain.  Pain cointinues to be major limiting factor, varied 2-8/10 and rarely painfree.  Pt c/o pain in heel and sharp burning pain in central incision with palpation.  Pt reports he is no longer wearing his cam boot due to it being uncomfortable and "worn out".  Pt states not wearing his boot has probably increased his soreness and intends on requesting a new boot from the MD.  Pt states PT has helped with his overall function and limitations and would like to continue with focus on reducing pain. Plan:  Per physician recommendations.  Goals Home Exercise Program Pt/caregiver will Perform Home Exercise Program: Independently- Progress: Met  PT Short Term Goals Time to Complete Short Term Goals: 4 weeks PT Short Term Goal 1: Pt will report present with minimal fascial restriction ans muscle spasms to incision and gastroc region to report pain less than 5/10 when walking.  (Continues to have fascial restrictions) - Progress: Met PT Short Term Goal 2: Pt will improve his ankle plantarlfexion to 30 degress to prepare for independent walking in normal shoes to improve toe off during gait.  - Progress: Met PT Short Term Goal 3: Pt will improve his Lt ankle strength by 1 muscle grade to prepare for independent walking in normal shoes.-Progress:  Met  PT Long Term Goals Time to Complete Long Term Goals: 8 weeks PT Long Term Goal 1: Pt will improve his ankle strength in order ambulate with mild gait impairments to decrease risk of secondary injury.- Progress: Met PT Long Term Goal 2: Pt  will improve his FOTO to status greater than 52% and limiation less than 48% for improved QOL.- Progress: Progressing toward goal Long Term Goal 3: Pt will improve Lt ankle AROM to [facility] in order to ascend and descend steps without increased difficulty.-Progress: Met  Problem List Patient Active Problem List   Diagnosis Date Noted  . Pain in joint, ankle and foot 01/20/2013  . Left leg weakness  01/20/2013  . CARPAL TUNNEL SYNDROME, HX OF 02/14/2009  . MONONEURITIS OF UNSPECIFIED SITE 11/22/2008  . 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 TENDINITIS, RIGHT WRIST 11/22/2008  . NECK PAIN 08/14/2008      Teena Irani, PTA/CLT; Geoffery Lyons, MPT, ATC 03/22/2013, 4:08 PM

## 2013-03-24 ENCOUNTER — Inpatient Hospital Stay (HOSPITAL_COMMUNITY): Admission: RE | Admit: 2013-03-24 | Payer: Medicare Other | Source: Ambulatory Visit

## 2013-03-29 ENCOUNTER — Ambulatory Visit (HOSPITAL_COMMUNITY)
Admission: RE | Admit: 2013-03-29 | Discharge: 2013-03-29 | Disposition: A | Discharge status: Home / Self Care | Payer: Medicare Other | Source: Ambulatory Visit | Attending: Orthopedic Surgery | Admitting: Orthopedic Surgery

## 2013-03-29 NOTE — Progress Notes (Signed)
Physical Therapy Documentation/ no Treatment Patient Details  Name: Joseph Pruitt MRN: [id number] Date of Birth: 09/06/1965  Today's Date: 03/29/2013 Time: 0815-0825 PT Time Calculation (min): 10 min Visit#: 13   Charges:  No charge  Subjective: Symptoms/Limitations Symptoms: Pt came to department today stating he has not been back to MD due to weather conditions on his appointment date.  Pt did not show for last sceduled appt here at the clinic.  Pt comes today wearing his CAM boot.   Exercise/Treatments No activity performed today.   Physical Therapy Assessment and Plan PT Assessment and Plan Clinical Impression Statement: Pt spoke with evaluating therapist, Geoffery Lyons PT.  Pt instructed to return to MD and await further orders as he has met goals except for pain.  Appointments canceled until further notice from MD or patient. PT Plan: Await MD orders.    Problem List Patient Active Problem List   Diagnosis Date Noted  . Pain in joint, ankle and foot 01/20/2013  . Left leg weakness 01/20/2013  . CARPAL TUNNEL SYNDROME, HX OF 02/14/2009  . MONONEURITIS OF UNSPECIFIED SITE 11/22/2008  . 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 TENDINITIS, RIGHT WRIST 11/22/2008  . NECK PAIN 08/14/2008      Teena Irani, PTA/CLT 03/29/2013, 8:45 AM

## 2013-03-31 ENCOUNTER — Ambulatory Visit (HOSPITAL_COMMUNITY): Payer: Medicare Other

## 2013-04-05 ENCOUNTER — Ambulatory Visit (HOSPITAL_COMMUNITY): Payer: Medicare Other | Admitting: Physical Therapy

## 2013-04-07 ENCOUNTER — Ambulatory Visit (HOSPITAL_COMMUNITY): Payer: Medicare Other

## 2013-04-08 ENCOUNTER — Ambulatory Visit (HOSPITAL_COMMUNITY): Payer: Medicare Other

## 2013-04-21 ENCOUNTER — Ambulatory Visit (HOSPITAL_COMMUNITY)
Admission: RE | Admit: 2013-04-21 | Discharge: 2013-04-21 | Disposition: A | Discharge status: Home / Self Care | Payer: Medicare Other | Source: Ambulatory Visit | Attending: Orthopedic Surgery | Admitting: Orthopedic Surgery

## 2013-04-21 DIAGNOSIS — R269 Unspecified abnormalities of gait and mobility: Secondary | ICD-10-CM | POA: Insufficient documentation

## 2013-04-21 DIAGNOSIS — IMO0001 Reserved for inherently not codable concepts without codable children: Secondary | ICD-10-CM | POA: Insufficient documentation

## 2013-04-21 DIAGNOSIS — R29898 Other symptoms and signs involving the musculoskeletal system: Secondary | ICD-10-CM | POA: Insufficient documentation

## 2013-04-21 DIAGNOSIS — M25579 Pain in unspecified ankle and joints of unspecified foot: Secondary | ICD-10-CM | POA: Insufficient documentation

## 2013-04-21 NOTE — Progress Notes (Signed)
Physical Therapy Treatment Patient Details  Name: Joseph Pruitt MRN: [id number] Date of Birth: 07-Sep-1965  Today's Date: 04/21/2013 Time: 0800-0845 PT Time Calculation (min): 45 min 0800 - 0815 TE  0815- 0825 ultrasound  0825 - 0845 manual   Visit#: 1 of 12  Re-eval: 05/21/13 Assessment Diagnosis: Rt achilles surgery Surgical Date: 11/05/12 Next MD Visit: Dr.Ostrum - January- 03/23/13  Authorization: Medicare  Authorization Time Period: new orders and re assessment for ongoing care 04/21/2013  , had completed 13 visits prior with last treatment date on 1/03/12/2013   Authorization Visit#: 1 of 10   Subjective: Symptoms/Limitations Symptoms:  patient states dr would like patient to continue rehab for left achilles tendonitis , reports left achilles pain walking long distance, 15 min walking tolerance limits due to ongoing left heel/achilles pain  Pertinent History: left achilles tendon surgery 11/05/2012 , hx of neck surgery  Limitations: Standing;Walking How long can you sit comfortably?: pain in sitting  How long can you walk comfortably?: 15 min  Patient Stated Goals: walk longer distances , less pain  Pain Assessment Currently in Pain?: Yes Pain Score: 3  (3-9/10 ) Pain Location: Ankle Pain Orientation: Left Pain Type: Chronic pain Pain Onset: More than a month ago Pain Frequency: Constant Effect of Pain on Daily Activities: limited walking       Exercise/Treatments Mobility/Balance     Static Standing Balance Single Leg Stance - Right Leg: 50  Ankle Stretches Plantar Fascia Stretch: 60 seconds;Limitations Plantar Fascia Stretch Limitations: rolling foot on ball 2 sets 60 seconds  Slant Board Stretch: 3 reps;30 seconds Other Stretch: foam rolling left gastroc 1 min x 3  (demonstration prior )    Modalities Modalities: Ultrasound Manual Therapy Manual Therapy: Myofascial release Myofascial Release: MFR to scar medial left distal achilles, achilles  tendon gliding  medial and lateral distally, calacaneal inversion /eversion mobs, MFR distal achilles prone x 15 min  Ultrasound Ultrasound Location: left achilles distal, scar site  pulsed  62mhz, 1.0 w/cm3 for pain , scar flexibility, and tissue extensibility x 10 min  Ultrasound Goals: Pain  Physical Therapy Assessment and Plan PT Assessment and Plan Clinical Impression Statement: patient has returned for PT after being on hold since 03/22/2013 pending dr appt with prior completion of 13 visits for med dx of left achilles tendonitis and surgery 11/05/2013. Patient has ongoing left achilles pain limiting his overall mobility. achilles pain /calacaneal pain is leading to gait deviation of limited toe off and patient reports limited tolerance for ambuation. there is scar adherence and achilles fasical restrictions, however strength and ROM are [facility] for proper gait. He had pain today  With  AROM left ankle all planes especailly ankle eversion  Pt will benefit from skilled therapeutic intervention in order to improve on the following deficits: Abnormal gait;Decreased mobility/walking tolerance;Pain;Decreased strength;Decreased range of motion left ankle  Rehab Potential: Fair Clinical Impairments Affecting Rehab Potential: 13 prior visits with pain levels remaining high with ambulation, reported decreased pain post todays treatment  PT Frequency: Min 2X/week PT Duration: 6 weeks PT Treatment/Interventions: Therapeutic activities;Therapeutic exercise;Balance training;Neuromuscular re-education;Patient/family education;Gait training;Stair training;Functional mobility training;Manual techniques;Modalities ( IFC, ultrasound) ice, heat, kinesio taping , footwear education  PT Plan: continue 2 week for 6 weeks as indicated above. reaccess  progress after 3-4 weeks,  Ultrasound , manual, continue stretches, foam rolling      Goals Home Exercise Program Pt/caregiver will Perform Home Exercise Program: For increased ROM PT  Goal: Perform Home Exercise Program - Progress: Goal  set today PT Short Term Goals Time to Complete Short Term Goals: 2 weeks PT Short Term Goal 1: Pt will report present with minimal fascial restriction and muscle spasms to incision and gastroc region to report pain less than 5/10 when walking.  PT Short Term Goal 1 - Progress: Progressing toward goal PT Short Term Goal 2: Pt will improve his ankle plantarlfexion to 30 degress to prepare for  improved toe off during giat  PT Short Term Goal 2 - Progress: Progressing toward goal PT Short Term Goal 3: Pt will improve his Lt ankle strength by 1 muscle grade to prepare for independent walking in normal shoes.  PT Short Term Goal 3 - Progress: Progressing toward goal PT Short Term Goal 4: patient to report intermitent rather than constant pain in sitting - new goal 04/21/2013  PT Short Term Goal 4 - Progress: Progressing toward goal PT Long Term Goals Time to Complete Long Term Goals:  (6 weeks ) PT Long Term Goal 1: ambulate 5 min in clinic post session no gait deviations - new goal set 04/21/2013  PT Long Term Goal 1 - Progress: Progressing toward goal PT Long Term Goal 2: Pt will improve his FOTO to status greater than 52% and limiation less than 48% for improved QOL.  PT Long Term Goal 2 - Progress: Progressing toward goal Long Term Goal 3: Pt will improve Lt ankle AROM to [facility] in order to ascend and descend steps without mild no no difficulty  Long Term Goal 3 Progress: Progressing toward goal Long Term Goal 4: patient to ambulate 20 min with max 4/10 pain for community related activities - new goal  Set 04/21/2013  Long Term Goal 4 Progress: Progressing toward goal  Problem List Patient Active Problem List   Diagnosis Date Noted  . Pain in joint, ankle and foot 01/20/2013  . Left leg weakness 01/20/2013  . CARPAL TUNNEL SYNDROME, HX OF 02/14/2009  . MONONEURITIS OF UNSPECIFIED SITE 11/22/2008  . SPONDYLOSIS, CERVICAL, WITH RADICULOPATHY  11/22/2008  . TENDINITIS, RIGHT WRIST 11/22/2008  . NECK PAIN 08/14/2008    PT - End of Session Activity Tolerance: Patient tolerated treatment well PT Plan of Care PT Patient Instructions: continue back into gentle gastroc and soleus towel stretches  Consulted and Agree with Plan of Care: Patient  GP Functional Assessment Tool Used: FOTO current status 33% , therapist kept with mobility impairment for G code as promray limit is pain with ambuation and limited walking tolerance  Functional Limitation: Mobility: Walking and moving around Mobility: Walking and Moving Around Current Status (L4650): At least 60 percent but less than 80 percent impaired, limited or restricted Mobility: Walking and Moving Around Goal Status [phone]): At least 40 percent but less than 60 percent impaired, limited or restricted  Daytona Hedman 04/21/2013, 10:27 AM

## 2013-04-25 ENCOUNTER — Ambulatory Visit (HOSPITAL_COMMUNITY)
Admission: RE | Admit: 2013-04-25 | Discharge: 2013-04-25 | Disposition: A | Discharge status: Home / Self Care | Payer: Medicare Other | Source: Ambulatory Visit | Attending: Orthopedic Surgery | Admitting: Orthopedic Surgery

## 2013-04-25 DIAGNOSIS — M25579 Pain in unspecified ankle and joints of unspecified foot: Secondary | ICD-10-CM

## 2013-04-25 DIAGNOSIS — R29898 Other symptoms and signs involving the musculoskeletal system: Secondary | ICD-10-CM

## 2013-04-25 NOTE — Progress Notes (Signed)
Physical Therapy Treatment Patient Details  Name: Joseph Pruitt MRN: [id number] Date of Birth: [birth year]  Today's Date: 04/25/2013 Time: 0800-0850 PT Time Calculation (min): 50 min 0800 - 0810    Heat moist R achilles  0810 - 0820  Ultrasound  0820 - 2423 manual  0835 -5361  TE  Visit#: 2 of 12  Re-eval: 05/21/13 Assessment Diagnosis: Rt achilles surgery Surgical Date: 11/05/12 Next MD Visit: Dr.Ostrum - January- 03/23/13  Authorization: Medicare  Authorization Time Period: new orders and re assessment for ongoing care 04/21/2013  , had completed 13 visits prior with last treatment date on 1/03/12/2013   Authorization Visit#: 2 of 10   Subjective: Symptoms/Limitations Symptoms: left foot hurts  Pertinent History: left achilles tendon surgery 11/05/2012 , hx of neck surgery  Pain Assessment Pain Score: 4  Pain Location: Ankle Pain Orientation: Left Pain Type: Chronic pain      Exercise/Treatments     Ankle Stretches Plantar Fascia Stretch: 3 reps;60 seconds Plantar Fascia Stretch Limitations: rolling foot on ball  Soleus Stretch: 2 reps;30 seconds;Limitations Soleus Stretch Limitations: R slant board  Gastroc Stretch: 3 reps;60 seconds;Limitations Gastroc Stretch Limitations: during heat on calf muscle  Slant Board Stretch: 3 reps;30 seconds Other Stretch: foam rolling left gastroc 1 min x 3    Modalities Modalities: Moist Heat Manual Therapy Myofascial Release: Achilles  tendon medial and lateral gliding, PROM ankle PF, AP and PA talocrual glides. calanceal mobs  x 15 min , trial of kinesio tape one strip over scar, one strip over achilles, no tension  Moist Heat Therapy Number Minutes Moist Heat: 10 Minutes Ultrasound Ultrasound Location: left achilles tendon. posterior heel, pulsed 3 mhz  1.0 w/cm3 for pain, flexibility, tissue extensibility   Physical Therapy Assessment and Plan PT Assessment and Plan Clinical Impression Statement: decreased symptom s post  treatment, trial of kinesio tape over achilles and scar no tension for pain     Goals Home Exercise Program PT Goal: Perform Home Exercise Program - Progress: Progressing toward goal PT Short Term Goals PT Short Term Goal 1: Pt will report present with minimal fascial restriction and muscle spasms to incision and gastroc region to report pain less than 5/10 when walking.  PT Short Term Goal 1 - Progress: Progressing toward goal PT Short Term Goal 2: Pt will improve his ankle plantarlfexion to 30 degress to prepare for  improved toe off during giat  PT Short Term Goal 2 - Progress: Progressing toward goal PT Short Term Goal 3: Pt will improve his Lt ankle strength by 1 muscle grade to prepare for independent walking in normal shoes.  PT Short Term Goal 3 - Progress: Progressing toward goal PT Short Term Goal 4: patient to report intermitent rather than constant pain in sitting  PT Short Term Goal 4 - Progress: Progressing toward goal PT Long Term Goals Time to Complete Long Term Goals: 4 weeks PT Long Term Goal 1: ambulate 5 min in clinic post session no gait deviations within 4 weeks  PT Long Term Goal 1 - Progress: Progressing toward goal Long Term Goal 3: Pt will improve Lt ankle AROM to [facility] in order to ascend and descend steps without mild no no difficulty  Long Term Goal 3 Progress: Progressing toward goal Long Term Goal 4: patient to ambulate 20 min with max 4/10 pain for community related activities within 4-5 weeks  Long Term Goal 4 Progress: Progressing toward goal  Problem List Patient Active Problem List   Diagnosis Date  Noted  . Pain in joint, ankle and foot 01/20/2013  . Left leg weakness 01/20/2013  . CARPAL TUNNEL SYNDROME, HX OF 02/14/2009  . MONONEURITIS OF UNSPECIFIED SITE 11/22/2008  . 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 TENDINITIS, RIGHT WRIST 11/22/2008  . NECK PAIN 08/14/2008    PT - End of Session Activity Tolerance: Patient tolerated  treatment well  GP    Savior Himebaugh 04/25/2013, 8:55 AM

## 2013-04-28 ENCOUNTER — Ambulatory Visit (HOSPITAL_COMMUNITY)
Admission: RE | Admit: 2013-04-28 | Discharge: 2013-04-28 | Disposition: A | Discharge status: Home / Self Care | Payer: Medicare Other | Source: Ambulatory Visit | Attending: Orthopedic Surgery | Admitting: Orthopedic Surgery

## 2013-04-28 NOTE — Progress Notes (Signed)
Physical Therapy Treatment Patient Details  Name: Joseph Pruitt MRN: [id number] Date of Birth: 06/08/1965  Today's Date: 04/28/2013 Time: 0808-0850 PT Time Calculation (min): 42 min  Visit#: 3 of 12  Re-eval: 05/21/13 Authorization: Medicare  Authorization Time Period: new orders and re assessment for ongoing care 04/21/2013  , had completed 13 visits prior with last treatment date on 1/03/12/2013   Authorization Visit#: 3 of 10  Charges:  heat 808-818 (10'), manual 818-841 (23'),  therex 842-850 (8')  Subjective: Symptoms/Limitations Symptoms: Pt states his Lt foot feels better overall, pain 3/10 this morning.  States he has to put a transmission in later. Pain Assessment Currently in Pain?: Yes Pain Score: 3  Pain Location: Ankle Pain Orientation: Left   Exercise/Treatments Ankle Stretches Soleus Stretch: 2 reps;30 seconds;Limitations Soleus Stretch Limitations: R slant board  Gastroc Stretch: 2 reps;30 seconds Gastroc Stretch Limitations: R slant board  Manual : MHP 10' prior to manual  Manual:   To Lt ankle/LE         Supine gastroc stretches, joint mobility, friction massage to scar and MFR     Prone:  gastroc stretches, roller to gastroc musculature   Physical Therapy Assessment and Plan PT Assessment and Plan Clinical Impression Statement: Continued focus on manual techniques/stretches to increase mobility.  MHP applied 10' prior to manual.  Pt sensitive lateral heel initially with roller but able to tolerate manual therapy/massage to this area.  Completed session with active gastroc/soleus stretches using slant board.  Pt responded well with decreased pain at end of session. PT Plan: Continue to focus on manual techniques, stretches and pain reduction.     Problem List Patient Active Problem List   Diagnosis Date Noted  . Pain in joint, ankle and foot 01/20/2013  . Left leg weakness 01/20/2013  . CARPAL TUNNEL SYNDROME, HX OF 02/14/2009  . MONONEURITIS OF  UNSPECIFIED SITE 11/22/2008  . 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 TENDINITIS, RIGHT WRIST 11/22/2008  . NECK PAIN 08/14/2008    PT - End of Session Activity Tolerance: Patient tolerated treatment well  GP    Teena Irani, PTA/CLT 04/28/2013, 9:31 AM

## 2013-05-03 ENCOUNTER — Ambulatory Visit (HOSPITAL_COMMUNITY)
Admission: RE | Admit: 2013-05-03 | Discharge: 2013-05-03 | Disposition: A | Discharge status: Home / Self Care | Payer: Medicare Other | Source: Ambulatory Visit | Attending: *Deleted | Admitting: *Deleted

## 2013-05-03 DIAGNOSIS — R29898 Other symptoms and signs involving the musculoskeletal system: Secondary | ICD-10-CM

## 2013-05-03 DIAGNOSIS — M25579 Pain in unspecified ankle and joints of unspecified foot: Secondary | ICD-10-CM

## 2013-05-03 NOTE — Progress Notes (Signed)
Physical Therapy Treatment Patient Details  Name: Joseph Pruitt MRN: [id number] Date of Birth: [birth year]  Today's Date: 05/03/2013 Time: 0810-0854 PT Time Calculation (min): 44 min Charge: MHP 0810-0820, Manual [phone], TE 6433-2951  Visit#: 4 of 12  Re-eval: 05/21/13    Authorization: Medicare  Authorization Time Period: new orders and re assessment for ongoing care 04/21/2013  , had completed 13 visits prior with last treatment date on 1/03/12/2013   Authorization Visit#: 4 of 10   Subjective: Symptoms/Limitations Symptoms:  Pt stated Lt heel is tender today, pain scale 3-4/10. Pain Assessment Currently in Pain?: Yes Pain Score: 4  Pain Location: Ankle Pain Orientation: Left  Objective:  Exercise/Treatments Ankle Stretches Soleus Stretch: 3 reps;30 seconds;Limitations Soleus Stretch Limitations: slant board Gastroc Stretch: 3 reps;30 seconds;Limitations Gastroc Stretch Limitations: slant board    Modalities Modalities: Moist Heat Manual Therapy Manual Therapy: Myofascial release Myofascial Release: Achilees tendon medial and lateral gliding, PROM ankle PF, AP and PA talocrual glides. calanceal mobs Other Manual Therapy: Prone  Moist Heat Therapy Number Minutes Moist Heat: 10 Minutes Moist Heat Location:  (Rt ankle)  Physical Therapy Assessment and Plan PT Assessment and Plan Clinical Impression Statement: Continued focus with manual techniques and stretches to improve flexibility.  Began with MHP prior manual to improve flexibility and ended with active stretches to gastrocnemius and soleus musculature.   PT Plan: Continue to focus on manual techniques, stretches and pain reduction.    Goals PT Short Term Goals PT Short Term Goal 1: Pt will report present with minimal fascial restriction and muscle spasms to incision and gastroc region to report pain less than 5/10 when walking.  PT Short Term Goal 1 - Progress: Progressing toward goal PT Short Term Goal 2: Pt  will improve his ankle plantarlfexion to 30 degress to prepare for  improved toe off during giat  PT Short Term Goal 2 - Progress: Progressing toward goal PT Short Term Goal 3: Pt will improve his Lt ankle strength by 1 muscle grade to prepare for independent walking in normal shoes.  PT Short Term Goal 4: patient to report intermitent rather than constant pain in sitting  PT Short Term Goal 4 - Progress: Progressing toward goal PT Long Term Goals Time to Complete Long Term Goals: 4 weeks PT Long Term Goal 1: ambulate 5 min in clinic post session no gait deviations within 4 weeks  Long Term Goal 3: Pt will improve Lt ankle AROM to [facility] in order to ascend and descend steps without mild no no difficulty  Long Term Goal 4: patient to ambulate 20 min with max 4/10 pain for community related activities within 4-5 weeks   Problem List Patient Active Problem List   Diagnosis Date Noted  . Pain in joint, ankle and foot 01/20/2013  . Left leg weakness 01/20/2013  . CARPAL TUNNEL SYNDROME, HX OF 02/14/2009  . MONONEURITIS OF UNSPECIFIED SITE 11/22/2008  . 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 TENDINITIS, RIGHT WRIST 11/22/2008  . NECK PAIN 08/14/2008    PT - End of Session Activity Tolerance: Patient tolerated treatment well General Behavior During Therapy: [facility] for tasks assessed/performed  GP    [name] 05/03/2013, 8:58 AM

## 2013-05-04 ENCOUNTER — Inpatient Hospital Stay (HOSPITAL_COMMUNITY): Admission: RE | Admit: 2013-05-04 | Payer: Medicare Other | Source: Ambulatory Visit | Admitting: *Deleted

## 2013-05-05 ENCOUNTER — Ambulatory Visit (HOSPITAL_COMMUNITY): Payer: Medicare Other

## 2013-05-09 ENCOUNTER — Ambulatory Visit (HOSPITAL_COMMUNITY): Payer: Medicare Other | Admitting: Physical Therapy

## 2013-05-10 ENCOUNTER — Ambulatory Visit (HOSPITAL_COMMUNITY)
Admission: RE | Admit: 2013-05-10 | Discharge: 2013-05-10 | Disposition: A | Discharge status: Home / Self Care | Payer: Medicare Other | Source: Ambulatory Visit | Attending: Orthopedic Surgery | Admitting: Orthopedic Surgery

## 2013-05-10 DIAGNOSIS — R262 Difficulty in walking, not elsewhere classified: Secondary | ICD-10-CM | POA: Insufficient documentation

## 2013-05-10 DIAGNOSIS — IMO0001 Reserved for inherently not codable concepts without codable children: Secondary | ICD-10-CM | POA: Insufficient documentation

## 2013-05-10 DIAGNOSIS — M25473 Effusion, unspecified ankle: Secondary | ICD-10-CM | POA: Insufficient documentation

## 2013-05-10 DIAGNOSIS — M25476 Effusion, unspecified foot: Secondary | ICD-10-CM | POA: Insufficient documentation

## 2013-05-10 DIAGNOSIS — M6281 Muscle weakness (generalized): Secondary | ICD-10-CM | POA: Insufficient documentation

## 2013-05-10 DIAGNOSIS — M25579 Pain in unspecified ankle and joints of unspecified foot: Secondary | ICD-10-CM | POA: Insufficient documentation

## 2013-05-10 NOTE — Progress Notes (Addendum)
Physical Therapy Treatment Patient Details  Name: Joseph Pruitt MRN: [id number] Date of Birth: 05-11-1965  Today's Date: 05/10/2013 Time: 0805-0843 PT Time Calculation (min): 38 min Charges: Manual x 54'([id number]) Therex x 20'(0823-0843)  Visit#: 5 of 12  Re-eval: 05/21/13  Authorization: Medicare  Authorization Time Period: new orders and re assessment for ongoing care 04/21/2013  , had completed 13 visits prior with last treatment date on 1/03/12/2013   Authorization Visit#: 5 of 10   Subjective: Symptoms/Limitations Symptoms: Pt states that he is working some on balance at home. He is stretching everyday. Pain Assessment Currently in Pain?: No/denies   Exercise/Treatments Ankle Stretches Plantar Fascia Stretch: 2 reps;30 seconds Soleus Stretch: 3 reps;30 seconds;Limitations Soleus Stretch Limitations: slant board Gastroc Stretch: 3 reps;30 seconds;Limitations Gastroc Stretch Limitations: slant board Ankle Exercises - Standing SLS: LLE max of 11" Heel Raises: 10 reps Toe Raise: 10 reps  Manual Therapy  Manual Therapy:  MFR to calf musculature and decrease pain and fascial restrictions, PROM all directions Other Manual Therapy: Prone  Physical Therapy Assessment and Plan PT Assessment and Plan Clinical Impression Statement: Treatment focus on improving motion, decreasing fascial restrictions and improving balance/stability. Multiple spasms noted throughout medial calf musculature. Manual techniques completed to these areas to decrease fascial restrictions and pain. Pt displays significant weakness in left plantar flexors. Encouraged pt to complete ankle strengthening and balance activities at home.   PT Plan: Continue to progress ankle strength/stability and utilize manual techniques to decrease pain and fascial restrictions.       Problem List Patient Active Problem List   Diagnosis Date Noted  . Pain in joint, ankle and foot 01/20/2013  . Left leg weakness 01/20/2013   . CARPAL TUNNEL SYNDROME, HX OF 02/14/2009  . MONONEURITIS OF UNSPECIFIED SITE 11/22/2008  . 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 TENDINITIS, RIGHT WRIST 11/22/2008  . NECK PAIN 08/14/2008    PT - End of Session Activity Tolerance: Patient tolerated treatment well General Behavior During Therapy: [facility] for tasks assessed/performed   Rachelle Hora, PTA  05/10/2013, 8:59 AM

## 2013-05-12 ENCOUNTER — Ambulatory Visit (HOSPITAL_COMMUNITY)
Admission: RE | Admit: 2013-05-12 | Discharge: 2013-05-12 | Disposition: A | Discharge status: Home / Self Care | Payer: Medicare Other | Source: Ambulatory Visit | Attending: Orthopedic Surgery | Admitting: Orthopedic Surgery

## 2013-05-12 NOTE — Progress Notes (Signed)
Physical Therapy Treatment Patient Details  Name: Joseph Pruitt MRN: [id number] Date of Birth: 01-22-1966  Today's Date: 05/12/2013 Time: 0805-0845 PT Time Calculation (min): 40 min  Visit#: 6 of 12  Re-eval: 05/21/13 Authorization: Medicare  Authorization Time Period: new orders and re assessment for ongoing care 04/21/2013  , had completed 13 visits prior with last treatment date on 1/03/12/2013   Authorization Visit#: 6 of 10  Charges:  TE 805-820 (15'), manual 822-845 (23')  Subjective: Symptoms/Limitations Symptoms: Pt comes today with new tennis shoes; states are helping.  Currently with 3/10 pain in Lt achilles distal scar region. Pain Assessment Currently in Pain?: Yes Pain Score: 3  Pain Location: Ankle Pain Orientation: Left   Exercise/Treatments Ankle Stretches Soleus Stretch: 3 reps;30 seconds;Limitations Soleus Stretch Limitations: slant board Gastroc Stretch: 3 reps;30 seconds;Limitations Gastroc Stretch Limitations: slant board Ankle Exercises - Standing SLS: LLE max of 22" Heel Walk (Round Trip): 2RT Toe Walk (Round Trip): 2RT   Manual Therapy Manual Therapy: Myofascial release Myofascial Release: MFR to LT calf musculature and achilles to decrease pain and fascial restrictions, PROM all directions in prone  Physical Therapy Assessment and Plan PT Assessment and Plan Clinical Impression Statement: continued focus on lengthening gastroc and loosening adhesions around scar.  No spasms palpated today with overall extensibility of tissues.  continued tenderness lateral of scar at distal end but able to tolerate manual therapy.  Increased SLS time today on Lt LE.  Added heel/toe walking with toewalking being the most difficult. PT Plan: Continue to progress ankle strength/stability and utilize manual techniques to decrease pain and fascial restrictions.       Problem List Patient Active Problem List   Diagnosis Date Noted  . Pain in joint, ankle and foot  01/20/2013  . Left leg weakness 01/20/2013  . CARPAL TUNNEL SYNDROME, HX OF 02/14/2009  . MONONEURITIS OF UNSPECIFIED SITE 11/22/2008  . 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 TENDINITIS, RIGHT WRIST 11/22/2008  . NECK PAIN 08/14/2008    PT - End of Session Activity Tolerance: Patient tolerated treatment well General Behavior During Therapy: WFL for tasks assessed/performed   Teena Irani, PTA/CLT 05/12/2013, 8:57 AM

## 2013-05-16 ENCOUNTER — Ambulatory Visit (HOSPITAL_COMMUNITY): Payer: Medicare Other | Admitting: Physical Therapy

## 2013-05-17 ENCOUNTER — Ambulatory Visit (HOSPITAL_COMMUNITY)
Admission: RE | Admit: 2013-05-17 | Discharge: 2013-05-17 | Disposition: A | Discharge status: Home / Self Care | Payer: Medicare Other | Source: Ambulatory Visit | Attending: Orthopedic Surgery | Admitting: Orthopedic Surgery

## 2013-05-17 DIAGNOSIS — R29898 Other symptoms and signs involving the musculoskeletal system: Secondary | ICD-10-CM

## 2013-05-17 DIAGNOSIS — M25579 Pain in unspecified ankle and joints of unspecified foot: Secondary | ICD-10-CM

## 2013-05-17 NOTE — Progress Notes (Signed)
Physical Therapy Treatment Patient Details  Name: Joseph Pruitt MRN: [id number] Date of Birth: 1966/01/27  Today's Date: 05/17/2013 Time: 0800-0845 PT Time Calculation (min): 45 min Charge TE [phone], Manual [phone], kinesio taping [phone]  Visit#: 7 of 12  Re-eval: 05/21/13 Assessment Diagnosis: Rt achilles surgery Surgical Date: 11/05/12 Next MD Visit: Dr.Ostrum - March 2015  Authorization: Medicare  Authorization Time Period: new orders and re assessment for ongoing care 04/21/2013  , had completed 13 visits prior with last treatment date on 1/03/12/2013   Authorization Visit#: 7 of 10   Subjective: Symptoms/Limitations Symptoms: Pt reported having a lot of walking with work last week Pain Assessment Currently in Pain?: Yes Pain Score: 5  Pain Location: Ankle Pain Orientation: Left  Objective:   Exercise/Treatments Ankle Stretches Soleus Stretch: 3 reps;30 seconds;Limitations Soleus Stretch Limitations: slant board Gastroc Stretch: 3 reps;30 seconds;Limitations Gastroc Stretch Limitations: slant board Ankle Exercises - Standing SLS: LLE max of 45" Heel Raises: 20 reps Heel Walk (Round Trip): 2RT Toe Walk (Round Trip): 2RT  Physical Therapy Assessment and Plan PT Assessment and Plan Clinical Impression Statement: Pt improving SLS on Lt LE.  Continued session focus on manuat techniques for gastrocnemius lengthening, reduction on fascial restrictions to loosen adhesion across scar.  Added compressions points 2 in lateral proximal tibia with positive results in flexibility following.  Ended session with kinesio taping.   PT Plan: Continue to progress ankle strength/stability and utilize manual techniques to decrease pain and fascial restrictions.      Goals PT Short Term Goals PT Short Term Goal 1: Pt will report present with minimal fascial restriction and muscle spasms to incision and gastroc region to report pain less than 5/10 when walking.  PT Short Term Goal  1 - Progress: Progressing toward goal PT Short Term Goal 2: Pt will improve his ankle plantarlfexion to 30 degress to prepare for  improved toe off during giat  PT Short Term Goal 2 - Progress: Progressing toward goal PT Short Term Goal 3: Pt will improve his Lt ankle strength by 1 muscle grade to prepare for independent walking in normal shoes.  PT Short Term Goal 3 - Progress: Progressing toward goal PT Short Term Goal 4: patient to report intermitent rather than constant pain in sitting  PT Long Term Goals Time to Complete Long Term Goals: 4 weeks PT Long Term Goal 1: ambulate 5 min in clinic post session no gait deviations within 4 weeks  PT Long Term Goal 1 - Progress: Progressing toward goal Long Term Goal 3: Pt will improve Lt ankle AROM to [facility] in order to ascend and descend steps without mild no no difficulty  Long Term Goal 4: patient to ambulate 20 min with max 4/10 pain for community related activities within 4-5 weeks   Problem List Patient Active Problem List   Diagnosis Date Noted  . Pain in joint, ankle and foot 01/20/2013  . Left leg weakness 01/20/2013  . CARPAL TUNNEL SYNDROME, HX OF 02/14/2009  . MONONEURITIS OF UNSPECIFIED SITE 11/22/2008  . 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 TENDINITIS, RIGHT WRIST 11/22/2008  . NECK PAIN 08/14/2008    PT - End of Session Activity Tolerance: Patient tolerated treatment well General Behavior During Therapy: [facility] for tasks assessed/performed  GP    [name] 05/17/2013, 9:53 AM

## 2013-05-19 ENCOUNTER — Ambulatory Visit (HOSPITAL_COMMUNITY)
Admission: RE | Admit: 2013-05-19 | Discharge: 2013-05-19 | Disposition: A | Discharge status: Home / Self Care | Payer: Medicare Other | Source: Ambulatory Visit | Attending: Orthopedic Surgery | Admitting: Orthopedic Surgery

## 2013-05-19 NOTE — Evaluation (Signed)
Physical Therapy Evaluation  Patient Details  Name: Joseph Pruitt MRN: [id number] Date of Birth: [birth year]  Today's Date: 05/19/2013 Time: 7001-7494 PT Time Calculation (min): 45 min   0845 - 66  TE  0910- 4967  manual             Visit#: 8 of 12  Re-eval: 06/18/13 Assessment Diagnosis: Rt achilles surgery Surgical Date: 11/05/12 Next MD Visit: Dr.Ostrum - March 2015  Authorization: Medicare    Authorization Time Period: new orders and re assessment for ongoing care 04/21/2013  , had completed 13 visits prior with last treatment date on 1/03/12/2013   Authorization Visit#: 8 of 10   Past Medical History:  Past Medical History  Diagnosis Date  . Stomach ulcer    Past Surgical History:  Past Surgical History  Procedure Laterality Date  . Spine surgery    . Hand surgery      Subjective Symptoms/Limitations Symptoms: reports walking too much, pain continues at achilles tendon area and scar site, he would like to contiue rehab, left achilles pain with walking 4-5/10  Pertinent History: left achilles tendon surgery 11/05/2012 , hx of neck surgery    Assessment LLE AROM (degrees) Left Ankle Dorsiflexion: 20, same as 03/22/13  Left Ankle Plantar Flexion: 30, was 25  Left Ankle Inversion: 35 Left Ankle Eversion: 15 LLE Strength Left Ankle Plantar Flexion: 3+/5, PF strength limited by pain and strength   Exercise/Treatments Mobility/Balance      Ankle Stretches Gastroc Stretch: 3 reps;30 seconds  L , 2 way  Other Stretch: kneeling left gatroc stretch   Aerobic Exercises Tread Mill: 7 min 1.5      Ankle Exercises - Standing SLS: L and R firm surface 2 x 15 sec, balance pad L and R 2 x 15  Heel Raises: 20 reps Toe Raise: Limitations Toe Raise Limitations: B LE  hand support  Other Standing Ankle Exercises: wall squats 10 x    Ankle Exercises - Supine T-Band: blue PF 2 x 20 L  Ankle Exercises - Sidelying   Manual Therapy Manual Therapy: Joint mobilization Joint  Mobilization: L AP talocrual mobs, prone ankle DF mobs, manual  gastroc calf stretch , calcaneal mobs x 15 min   Physical Therapy Assessment and Plan PT Assessment and Plan Clinical Impression Statement: improving with ankle ROM and stability but ongoing subjective reports of achilles pain with walking and gait deviations related to limited left foot push off in late stacne and minimal heel strike  Pt will benefit from skilled therapeutic intervention in order to improve on the following deficits: Abnormal gait;Pain;Decreased strength Rehab Potential: Fair Clinical Impairments Affecting Rehab Potential: ongoing achilles pain  PT Plan: continue x 4 visiits, continue balance and stretches per tolerance, trial lunges next vitis for stability and stretching to achilles area and legs, manual prn . continue to encourage stretching at home     Goals PT Short Term Goals Time to Complete Short Term Goals: 2 weeks PT Short Term Goal 1: Pt will report present with minimal fascial restriction and muscle spasms to incision and gastroc region to report pain less than 5/10 when walking.  PT Short Term Goal 1 - Progress: Progressing toward goal PT Short Term Goal 2: Pt will improve his ankle plantarlfexion to 30 degress to prepare for  improved toe off during giat  PT Short Term Goal 2 - Progress: Progressing toward goal PT Short Term Goal 3: Pt will improve his Lt ankle strength by 1 muscle grade  to prepare for independent walking in normal shoes.  PT Short Term Goal 3 - Progress: Progressing toward goal PT Short Term Goal 4: patient to report intermitent rather than constant pain in sitting  PT Short Term Goal 4 - Progress: Progressing toward goal PT Long Term Goals Time to Complete Long Term Goals: 4 weeks PT Long Term Goal 1: ambulate 5 min in clinic post session no gait deviations within 4 weeks  PT Long Term Goal 1 - Progress: Progressing toward goal PT Long Term Goal 2: Pt will improve his FOTO to  status greater than 52% and limiation less than 48% for improved QOL.  PT Long Term Goal 2 - Progress: Progressing toward goal Long Term Goal 3: Pt will improve Lt ankle AROM to [facility] in order to ascend and descend steps without mild no no difficulty  Long Term Goal 3 Progress: Met Long Term Goal 4: patient to ambulate 20 min with max 4/10 pain for community related activities within 4-5 weeks  Long Term Goal 4 Progress: Not met  Problem List Patient Active Problem List   Diagnosis Date Noted  . Pain in joint, ankle and foot 01/20/2013  . Left leg weakness 01/20/2013  . CARPAL TUNNEL SYNDROME, HX OF 02/14/2009  . MONONEURITIS OF UNSPECIFIED SITE 11/22/2008  . 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 TENDINITIS, RIGHT WRIST 11/22/2008  . NECK PAIN 08/14/2008    PT - End of Session Activity Tolerance: Patient limited by pain PT Plan of Care Consulted and Agree with Plan of Care: Patient  GP    Sarajane Marek 05/19/2013, 9:25 AM  Physician Documentation Your signature is required to indicate approval of the treatment plan as stated above.  Please sign and either send electronically or make a copy of this report for your files and return this physician signed original.   Please mark one 1.__approve of plan  2. ___approve of plan with the following conditions.   ______________________________                                                          _____________________ Physician Signature                                                                                                             Date

## 2013-05-24 ENCOUNTER — Telehealth (HOSPITAL_COMMUNITY): Payer: Self-pay

## 2013-05-24 ENCOUNTER — Ambulatory Visit (HOSPITAL_COMMUNITY): Payer: Medicare Other | Admitting: Physical Therapy

## 2013-05-26 ENCOUNTER — Ambulatory Visit (HOSPITAL_COMMUNITY)
Admission: RE | Admit: 2013-05-26 | Discharge: 2013-05-26 | Disposition: A | Discharge status: Home / Self Care | Payer: Medicare Other | Source: Ambulatory Visit | Attending: Orthopedic Surgery | Admitting: Orthopedic Surgery

## 2013-05-26 NOTE — Progress Notes (Signed)
Physical Therapy Treatment Patient Details  Name: Joseph Pruitt MRN: [id number] Date of Birth: 1965-03-31  Today's Date: 05/26/2013 Time: 2778-2423 PT Time Calculation (min): 40 min Charges: Therex x 28' manual x 10'  Visit#: 9 of 12  Re-eval: 06/18/13  Authorization: Medicare  Authorization Time Period: new orders and re assessment for ongoing care 04/21/2013  , had completed 13 visits prior with last treatment date on 1/03/12/2013   Authorization Visit#: 9 of 10   Subjective: Symptoms/Limitations Symptoms: Pt reports continued pain in achilles with standing and ambulation. Pain increases with plantarflexion. Pain Assessment Currently in Pain?: Yes Pain Score: 5  Pain Location: Ankle Pain Orientation: Left;Posterior   Exercise/Treatments Machines for Strengthening Cybex Leg Press: 4PL, Lt only PF/DF 15 reps Ankle Exercises - Standing SLS: LLE 10" RLE 60" max of 3 Rocker Board: 2 minutes Rocker Board Limitations: R/L A/P no HHA Heel Raises: 20 reps Heel Walk (Round Trip): 2RT Toe Walk (Round Trip): 2RT  Physical Therapy Assessment and Plan PT Assessment and Plan Clinical Impression Statement: Pt completes therex well after initial cueing and demo. Pt has most difficulty with plantar flexion secondary to weakness and pain. Manual techniques completed to left calf and hell to decrease fascial restrictions and decrease pain. Pt will benefit from skilled therapeutic intervention in order to improve on the following deficits: Abnormal gait;Pain;Decreased strength Rehab Potential: Fair Clinical Impairments Affecting Rehab Potential: ongoing achilles pain  PT Plan: Continue to progress balance and stretches per tolerance, trial lunges next vitit for stability and stretching to achilles area and legs, manual prn . Continue to encourage stretching at home. Update g-code next session.    Problem List Patient Active Problem List   Diagnosis Date Noted  . Pain in joint, ankle and foot  01/20/2013  . Left leg weakness 01/20/2013  . CARPAL TUNNEL SYNDROME, HX OF 02/14/2009  . MONONEURITIS OF UNSPECIFIED SITE 11/22/2008  . 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 TENDINITIS, RIGHT WRIST 11/22/2008  . NECK PAIN 08/14/2008    PT - End of Session Activity Tolerance: Patient limited by pain General Behavior During Therapy: [facility] for tasks assessed/performed  Rachelle Hora, PTA  05/26/2013, 8:57 AM

## 2013-05-31 ENCOUNTER — Ambulatory Visit (HOSPITAL_COMMUNITY)
Admission: RE | Admit: 2013-05-31 | Discharge: 2013-05-31 | Disposition: A | Discharge status: Home / Self Care | Payer: Medicare Other | Source: Ambulatory Visit | Attending: Orthopedic Surgery | Admitting: Orthopedic Surgery

## 2013-05-31 NOTE — Progress Notes (Signed)
Physical Therapy Treatment Patient Details  Name: SALIH WILLIAMSON MRN: [id number] Date of Birth: 06-13-1965  Today's Date: 05/31/2013 Time: 0806-0850 PT Time Calculation (min): 44 min Charges: Therex [phone]) Manual x [phone]) Ice x 5([phone])  Visit#: 10 of 12  Re-eval: 06/18/13  Authorization: Medicare  Authorization Time Period: new orders and re assessment for ongoing care 04/21/2013  , had completed 13 visits prior with last treatment date on 1/03/12/2013   Authorization Visit#: 10 of 10   Subjective: Symptoms/Limitations Symptoms: Pt stateathat he bought new tennis shoes but they hurt his feet. Pain Assessment Currently in Pain?: No/denies  Precautions/Restrictions     Exercise/Treatments Ankle Stretches Slant Board Stretch: 2 reps;30 seconds Machines for Strengthening Cybex Leg Press: 4PL, Lt only PF/DF 10 reps Ankle Exercises - Standing Rocker Board: 2 minutes Rocker Board Limitations: R/L A/P no HHA Heel Raises: 20 reps Heel Raises Limitations:  (10x left eccentric lowering) Toe Walk (Round Trip): 1RT Other Standing Ankle Exercises: Forward lunges x 10 B  Manual Therapy Myofascial Release: MFR to LT calf musculature and achilles to decrease pain and fascial restrictions, PROM all directions in prone Cryotherapy Number Minutes Cryotherapy: 8 Minutes Cryotherapy Location: Ankle Type of Cryotherapy: Ice massage  Physical Therapy Assessment and Plan PT Assessment and Plan Clinical Impression Statement: Pt displays improved gastroc strength with toe raises. Pt struggles with toe walking due to weakness/pain. Pt reports most pain at achilles insertion. Ice massage completed to this area to decrease pain and inflammation. Encouraged pt to focus on gastroc strengthening and icing and home. Pt will benefit from skilled therapeutic intervention in order to improve on the following deficits: Abnormal gait;Pain;Decreased strength Rehab Potential: Fair PT Plan:  Continue to progress balance and stretches per tolerance. Continue to encourage stretching and icing at home.     Problem List Patient Active Problem List   Diagnosis Date Noted  . Pain in joint, ankle and foot 01/20/2013  . Left leg weakness 01/20/2013  . CARPAL TUNNEL SYNDROME, HX OF 02/14/2009  . MONONEURITIS OF UNSPECIFIED SITE 11/22/2008  . 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 TENDINITIS, RIGHT WRIST 11/22/2008  . NECK PAIN 08/14/2008    PT - End of Session Activity Tolerance: Patient limited by pain General Behavior During Therapy: [facility] for tasks assessed/performed  GP Functional Assessment Tool Used: FOTO 40% Functional Limitation: Mobility: Walking and moving around Mobility: Walking and Moving Around Current Status (S2831): At least 60 percent but less than 80 percent impaired, limited or restricted Mobility: Walking and Moving Around Goal Status [phone]): At least 40 percent but less than 60 percent impaired, limited or restricted  Rachelle Hora, PTA  05/31/2013, 9:24 AM

## 2013-06-02 ENCOUNTER — Ambulatory Visit (HOSPITAL_COMMUNITY)
Admission: RE | Admit: 2013-06-02 | Discharge: 2013-06-02 | Disposition: A | Discharge status: Home / Self Care | Payer: Medicare Other | Source: Ambulatory Visit | Attending: Orthopedic Surgery | Admitting: Orthopedic Surgery

## 2013-06-02 NOTE — Progress Notes (Signed)
Physical Therapy Treatment Patient Details  Name: Joseph Pruitt MRN: [id number] Date of Birth: [birth year]  Today's Date: 06/02/2013 Time: 0808 (pt late for appt)-0848 PT Time Calculation (min): 40 min Visit#: 11 of 12  Re-eval: 06/18/13 Authorization: Medicare  Authorization Visit#: 11 of 20 Charges:  therex [phone] (29')  Ice massage [phone] (10')    Subjective: Symptoms/Limitations Symptoms: Pt states he has no pain today.  Reports he cant wear his new shoes ([employer]) because they hurt his feet.  Currently without pain.   Pain Assessment Currently in Pain?: No/denies Pain Score: 0-No pain   Exercise/Treatments Ankle Stretches Slant Board Stretch: 3 reps;30 seconds Machines for Strengthening Cybex Leg Press: 4PL, Lt only PF/DF 2 sets 10 reps Ankle Exercises - Standing SLS: LLE 34"  max of 3 Rocker Board: 2 minutes Rocker Board Limitations: R/L A/P no HHA Heel Raises: 20 reps;Limitations Heel Raises Limitations: eccentric lowering Heel Walk (Round Trip): 1RT Toe Walk (Round Trip): 1RT Other Standing Ankle Exercises: Forward lunges x 10 B    Cryotherapy Number Minutes Cryotherapy: 10 Minutes Cryotherapy Location: Ankle Type of Cryotherapy:  (ice massage)  Physical Therapy Assessment and Plan PT Assessment and Plan Clinical Impression Statement: Pt comes today wearing high top boots, states these are most comfortable.  Continued difficulty with toe walking, c/o pain and with antalgia during activity.  No difficulties with remainder of exercises/activities.  Pt instructed to complete ice massages at home.  pt verbalized understanding. PT Plan: Re-evaluate next visit.      Problem List Patient Active Problem List   Diagnosis Date Noted  . Pain in joint, ankle and foot 01/20/2013  . Left leg weakness 01/20/2013  . CARPAL TUNNEL SYNDROME, HX OF 02/14/2009  . MONONEURITIS OF UNSPECIFIED SITE 11/22/2008  . 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  TENDINITIS, RIGHT WRIST 11/22/2008  . NECK PAIN 08/14/2008    PT - End of Session Activity Tolerance: Patient limited by pain General Behavior During Therapy: [facility] for tasks assessed/performed  GP Functional Limitation: Mobility: Walking and moving around  Teena Irani, PTA/CLT 06/02/2013, 9:01 AM

## 2013-06-07 ENCOUNTER — Ambulatory Visit (HOSPITAL_COMMUNITY)
Admission: RE | Admit: 2013-06-07 | Discharge: 2013-06-07 | Disposition: A | Discharge status: Home / Self Care | Payer: Medicare Other | Source: Ambulatory Visit | Attending: Orthopedic Surgery | Admitting: Orthopedic Surgery

## 2013-06-07 DIAGNOSIS — M25579 Pain in unspecified ankle and joints of unspecified foot: Secondary | ICD-10-CM

## 2013-06-07 DIAGNOSIS — R29898 Other symptoms and signs involving the musculoskeletal system: Secondary | ICD-10-CM

## 2013-06-07 NOTE — Evaluation (Signed)
Physical Therapy re eval/ Discharge Summary   Patient Details  Name: Joseph Pruitt MRN: [id number] Date of Birth: 1965/09/03  Today's Date: 06/07/2013 Time: 0800-0845 PT Time Calculation (min): 45 min TE 0800- 0845              Visit#: 12 of 12  Re-eval:   Assessment Diagnosis: Rt achilles surgery Surgical Date: 11/05/12 Next MD Visit: Dr.Ostrum - March 2015  Authorization: Medicare    Authorization Time Period: new orders and re assessment for ongoing care 04/21/2013  , had completed 13 visits prior with last treatment date on 1/03/12/2013   Authorization Visit#:   of     Past Medical History:  Past Medical History  Diagnosis Date  . Stomach ulcer    Past Surgical History:  Past Surgical History  Procedure Laterality Date  . Spine surgery    . Hand surgery      Subjective Symptoms/Limitations Symptoms: patient states intermittent pain in left achilles and scar site, overall reports much improved since start of ice massage , will follow up with dr , pain 0-3/10  Pertinent History: left achilles tendon surgery 11/05/2012 , hx of neck surgery  How long can you walk comfortably?: 20 min   Assessment LLE AROM (degrees) Left Ankle Dorsiflexion: 15 Left Ankle Plantar Flexion: 30 Left Ankle Inversion: 45 Left Ankle Eversion: 20 LLE Strength Left Ankle Dorsiflexion: 5/5 Left Ankle Plantar Flexion: 3+/5 Left Ankle Inversion: 5/5 Left Ankle Eversion: 5/5  Exercise/Treatments     Ankle Stretches Other Stretch: wall bicycling heels 30 x  Aerobic Exercises   Machines for Strengthening Cybex Leg Press: 4 plate PF bilateral 20 x  Ankle Plyometrics   Ankle Exercises - Standing SLS: L LE 30 sec x 2 trials  Rocker Board: 2 minutes Rocker Board Limitations: R/L A/P no HHA Heel Raises: 20 reps;Limitations Heel Walk (Round Trip): 1RT Toe Walk (Round Trip): 1RT Balance Beam: tandem walking on beam, suide steping on beam, LEft leg stationary then right leg forward and back  steps on beam  Other Standing Ankle Exercises: forward lunge onto BOSU L/R 2 x 10, BOSU step up 20 x L/R  Other Standing Ankle Exercises: wall squats 2 x 10    Other Supine Ankle Exercises: left ankle AROM all planes 20x    Manual Therapy Other Manual Therapy: ice massage scar site 2 min, calcaneal mobs all planes   Physical Therapy Assessment and Plan PT Assessment and Plan Clinical Impression Statement: patient states still having left heel pain with walking any length of time and on uneven terrain, he did present today stating no pain but did have onset of pain with forward lunging. He has restored ankle AROM WNL but still has deficits with left gastroc strength. patient has completed 12 visits between 2/12/ amd 3/31 with prior episodes of care before and after surgery. He has been instructed in HEP and recent addition of ice massage. Patient to date has received all aspects of outpatient skilled therapy including manual techniques, there ex, balance, modalites PT Plan: discharge from PT , continue home program     Goals Home Exercise Program Pt/caregiver will Perform Home Exercise Program: For increased ROM PT Goal: Perform Home Exercise Program - Progress: Met PT Short Term Goals Time to Complete Short Term Goals: 2 weeks PT Short Term Goal 1: Pt will report present with minimal fascial restriction and muscle spasms to incision and gastroc region to report pain less than 5/10 when walking.  PT Short Term Goal  1 - Progress: Met PT Short Term Goal 2: Pt will improve his ankle plantarlfexion to 30 degress to prepare for  improved toe off during giat  PT Short Term Goal 2 - Progress: Met PT Short Term Goal 3: Pt will improve his Lt ankle strength by 1 muscle grade to prepare for independent walking in normal shoes.  PT Short Term Goal 3 - Progress: Partly met PT Short Term Goal 4: patient to report intermitent rather than constant pain in sitting  PT Short Term Goal 4 - Progress: Met PT  Long Term Goals Time to Complete Long Term Goals: 4 weeks PT Long Term Goal 1: ambulate 5 min in clinic post session no gait deviations within 4 weeks  PT Long Term Goal 1 - Progress: Met Long Term Goal 3: Pt will improve Lt ankle AROM to [facility] in order to ascend and descend steps without mild no no difficulty  Long Term Goal 3 Progress: Partly met Long Term Goal 4: patient to ambulate 20 min with max 4/10 pain for community related activities within 4-5 weeks  Long Term Goal 4 Progress: Met  Problem List Patient Active Problem List   Diagnosis Date Noted  . Pain in joint, ankle and foot 01/20/2013  . Left leg weakness 01/20/2013  . CARPAL TUNNEL SYNDROME, HX OF 02/14/2009  . MONONEURITIS OF UNSPECIFIED SITE 11/22/2008  . 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 TENDINITIS, RIGHT WRIST 11/22/2008  . NECK PAIN 08/14/2008    PT Plan of Care Consulted and Agree with Plan of Care: Patient  GP Functional Assessment Tool Used: FOTO 40% taken 3/25  Functional Limitation: Mobility: Walking and moving around Mobility: Walking and Moving Around Goal Status [phone]): At least 60 percent but less than 80 percent impaired, limited or restricted Mobility: Walking and Moving Around Discharge Status [phone]): At least 60 percent but less than 80 percent impaired, limited or restricted  Izzabell Klasen 06/07/2013, 9:19 AM  Physician Documentation Your signature is required to indicate approval of the treatment plan as stated above.  Please sign and either send electronically or make a copy of this report for your files and return this physician signed original.   Please mark one 1.__approve of plan  2. ___approve of plan with the following conditions.   ______________________________                                                          _____________________ Physician Signature                                                                                                              Date

## 2013-06-30 DIAGNOSIS — I10 Essential (primary) hypertension: Secondary | ICD-10-CM | POA: Insufficient documentation

## 2013-06-30 DIAGNOSIS — E7800 Pure hypercholesterolemia, unspecified: Secondary | ICD-10-CM | POA: Insufficient documentation

## 2013-07-12 ENCOUNTER — Ambulatory Visit (HOSPITAL_COMMUNITY)
Admission: RE | Admit: 2013-07-12 | Discharge: 2013-07-12 | Disposition: A | Discharge status: Home / Self Care | Payer: Medicare Other | Source: Ambulatory Visit | Attending: Orthopedic Surgery | Admitting: Orthopedic Surgery

## 2013-07-12 DIAGNOSIS — M25473 Effusion, unspecified ankle: Secondary | ICD-10-CM | POA: Insufficient documentation

## 2013-07-12 DIAGNOSIS — R29898 Other symptoms and signs involving the musculoskeletal system: Secondary | ICD-10-CM

## 2013-07-12 DIAGNOSIS — R262 Difficulty in walking, not elsewhere classified: Secondary | ICD-10-CM | POA: Insufficient documentation

## 2013-07-12 DIAGNOSIS — IMO0001 Reserved for inherently not codable concepts without codable children: Secondary | ICD-10-CM | POA: Insufficient documentation

## 2013-07-12 DIAGNOSIS — M6281 Muscle weakness (generalized): Secondary | ICD-10-CM | POA: Insufficient documentation

## 2013-07-12 DIAGNOSIS — M25579 Pain in unspecified ankle and joints of unspecified foot: Secondary | ICD-10-CM

## 2013-07-12 DIAGNOSIS — M25476 Effusion, unspecified foot: Secondary | ICD-10-CM | POA: Insufficient documentation

## 2013-07-12 NOTE — Evaluation (Addendum)
Physical Therapy Evaluation  Patient Details  Name: Joseph Pruitt MRN: [id number] Date of Birth: 1965/11/21  Today's Date: 07/12/2013 Time: 0800-0845 PT Time Calculation (min): 45 min   Charges: 1 Eval, Manual therapy [id number]           Visit#: 1 of 16  Re-eval: 08/11/13 Assessment Diagnosis: Rt achilles surgery Surgical Date: 11/05/12 Next MD Visit: Dr.Ostrum - March 2015  Authorization: Medicare    Authorization Time Period: new orders and re assessment for ongoing care 04/21/2013  , had completed 13 visits prior with last treatment date on 1/03/12/2013   Authorization Visit#: 1 of 16   Past Medical History:  Past Medical History  Diagnosis Date  . Stomach ulcer    Past Surgical History:  Past Surgical History  Procedure Laterality Date  . Spine surgery    . Hand surgery      Subjective Symptoms/Limitations Symptoms: Patint returns to therapy with contineud pain in the heel and achilles areas of the foot. Pain and symptoms have continued to improve Pertinent History: left achilles tendon surgery 11/05/2012 , hx of neck surgery  Limitations: Standing;Walking How long can you sit comfortably?: stiffens up with prolonged sitting/standing How long can you walk comfortably?: 25 min  Pain Assessment Currently in Pain?: Yes Pain Score: 2  Pain Location: Ankle Pain Orientation: Left;Posterior Pain Type: Chronic pain Pain Onset: More than a month ago Pain Frequency: Constant Pain Relieving Factors: pain medicaion 2x/day and ice, gentle ankle AROM, massage Effect of Pain on Daily Activities: limited walking  Sensation/Coordination/Flexibility/Functional Tests Functional Tests Functional Tests: Calcaneal inversion: 20 degrees, -8 eversion.   Assessment LLE AROM (degrees) Left Ankle Dorsiflexion: 15 Left Ankle Plantar Flexion: 38 Left Ankle Inversion: 40 Left Ankle Eversion: 20 LLE Strength Left Ankle Dorsiflexion: 5/5 Left Ankle Plantar Flexion: 3+/5 Left Ankle  Inversion: 5/5 Left Ankle Eversion: 5/5 Palpation Palpation: tenderness over scar on posterior heel, minor swelling.  Exercise/Treatments 3D ankle Excursion 10 repetitions each with towel under calcaneus to improve calcaneal eversion    Manual Therapy Joint Mobilization: L AP talocrual mobs, prone ankle DF mobs, manual gastroc calf stretch , calcaneal mobs. Focused most on calcaneal Eversion mobilization Grade 2-3 Myofascial Release: MFR to LT calf musculature and achilles to decrease pain and fascial restrictions,  Physical Therapy Assessment and Plan PT Assessment and Plan Clinical Impression Statement: Patient displays contineud pain in achilles after attmepting home discharge with self performance of HEP and self treatment to returnt o prior level of function. Patient has been given orthotis and a heel raise to address impaired foot mechanics for which these appear to be helping address and decreasign the patient's pain. Patient dispalys several contineud contributing factors to hip pain: limited gastroc strength, limited calcaneal eversion, limited gastroc/soleus/dorsiflexion ROM. Patient will benefit from  contineud skilled physical therapy to address the above list limitations  Expecting patient to require 8 weeks of physical therapy 2x weeky to improve oabove listed impairment and return to performance of normal ADL's Pt will benefit from skilled therapeutic intervention in order to improve on the following deficits: Impaired flexibility;Abnormal gait;Decreased activity tolerance;Decreased balance;Decreased range of motion;Increased fascial restricitons;Difficulty walking;Decreased strength;Pain Rehab Potential: Fair Clinical Impairments Affecting Rehab Potential: ongoing achilles pain  PT Frequency: Min 2X/week PT Duration: 8 weeks PT Treatment/Interventions: Therapeutic activities;Therapeutic exercise;Balance training;Neuromuscular re-education;Patient/family education;Gait  training;Stair training;Functional mobility training;Manual techniques;Modalities PT Plan: Initial focus to be on improving ankle ROM in closed and open chain positions and improve ankle stabilization and gastroc strength  Goals Home Exercise Program Pt/caregiver will Perform Home Exercise Program: For increased ROM PT Goal: Perform Home Exercise Program - Progress: Goal set today PT Short Term Goals Time to Complete Short Term Goals: 4 weeks PT Short Term Goal 1: Pt will report present with minimal fascial restriction and muscle spasms to incision and gastroc region to report pain less than 2/10 when walking.  PT Short Term Goal 2: Pt will improve his ankle plantarlfexion to 40 degress to prepare forimproved stride length during giat  PT Short Term Goal 3: Pt will improve his Lt ankle strength to 3/5 muscle grade to prepare for independent walking in normal shoes.  PT Short Term Goal 4: patient to report intermitent rather than constant pain in sitting  PT Long Term Goals Time to Complete Long Term Goals: 8 weeks PT Long Term Goal 1: Patient will improve ankle dorsiflexion ROM to 20 degrees to demosntrate increased achilles/gastroc mobility to increase stride length. PT Long Term Goal 2: Pt will improve his Lt ankle strength to 4/5 muscle grade to perform walking/and standing on tos to reach to high shelf Long Term Goal 3: Pt will improve Lt ankle AROM to [facility] in order to ascend and descend steps with no difficulty  Long Term Goal 4: patient to ambulate 30 min with no pain for community related activities  PT Long Term Goal 5: Pt will improve his Lt ankle strength to 5/5 muscle grade to prepare return to sport/basketball  Problem List Patient Active Problem List   Diagnosis Date Noted  . Pain in joint, ankle and foot 01/20/2013  . Left leg weakness 01/20/2013  . CARPAL TUNNEL SYNDROME, HX OF 02/14/2009  . MONONEURITIS OF UNSPECIFIED SITE 11/22/2008  . SPONDYLOSIS, CERVICAL, WITH  RADICULOPATHY 11/22/2008  . TENDINITIS, RIGHT WRIST 11/22/2008  . NECK PAIN 08/14/2008    PT - End of Session Activity Tolerance: Patient limited by pain General Behavior During Therapy: WFL for tasks assessed/performed PT Plan of Care PT Home Exercise Plan: 3D ankle excursion with towel to increase calcaneal eversion PT Patient Instructions: continue back into gentle gastroc and soleus towel stretches   GP Functional Assessment Tool Used: FOTO 40% Functional Limitation: Mobility: Walking and moving around Mobility: Walking and Moving Around Current Status (U9323): At least 60 percent but less than 80 percent impaired, limited or restricted Mobility: Walking and Moving Around Goal Status [phone]): At least 40 percent but less than 60 percent impaired, limited or restricted  Niko Jakel R Ara Grandmaison 07/12/2013, 8:01 PM  Physician Documentation Your signature is required to indicate approval of the treatment plan as stated above.  Please sign and either send electronically or make a copy of this report for your files and return this physician signed original.   Please mark one 1.__approve of plan  2. ___approve of plan with the following conditions.   ______________________________                                                          _____________________ Physician Signature  Date  

## 2013-07-20 ENCOUNTER — Ambulatory Visit (HOSPITAL_COMMUNITY)
Admission: RE | Admit: 2013-07-20 | Discharge: 2013-07-20 | Disposition: A | Discharge status: Home / Self Care | Payer: Medicare Other | Source: Ambulatory Visit | Attending: Orthopedic Surgery | Admitting: Orthopedic Surgery

## 2013-07-20 DIAGNOSIS — R29898 Other symptoms and signs involving the musculoskeletal system: Secondary | ICD-10-CM

## 2013-07-20 DIAGNOSIS — M25579 Pain in unspecified ankle and joints of unspecified foot: Secondary | ICD-10-CM

## 2013-07-20 NOTE — Progress Notes (Signed)
Physical Therapy Treatment Patient Details  Name: Joseph Pruitt MRN: [id number] Date of Birth: 1965-08-26  Today's Date: 07/20/2013 Time: 0800-0845 PT Time Calculation (min): 45 min  Charges: Manual 8:00-810, TherEx 810-845 Visit#: 2 of 16  Re-eval: 08/11/13 Assessment Diagnosis: Rt achilles surgery Surgical Date: 11/05/12 Next MD Visit: Dr.Ostrum - March 2015  Authorization: Medicare  Authorization Time Period: new orders and re assessment for ongoing care 04/21/2013  , had completed 13 visits prior with last treatment date on 1/03/12/2013   Authorization Visit#: 2 of 16   Subjective: Symptoms/Limitations Symptoms: Patient states he had a good weekend with little pain, and that he feels he is ready to remove the heel cup under his foot orthotics Pain Assessment Currently in Pain?: Yes Pain Score: 4   Exercise/Treatments Ankle Stretches Gastroc Stretch: 3 reps;30 seconds Other Stretch: 3D ankle excursion, 10x 3second each with towel under heel to improve eversion Ankle Exercises - Standing BAPS: Level 3;Standing;15 reps (CLOCKWISE COUNTER CLOCK WISE. ) Rocker Board: 2 minutes Rocker Board Limitations: R/L A/P no HHA Other Standing Ankle Exercises: 1 Way flamengo 10x Other Standing Ankle Exercises: Squat matrix 5x each  Physical Therapy Assessment and Plan PT Assessment and Plan Clinical Impression Statement: patient displays improving pain response with improving ability to perform mobility and stability based exercises this session. Patient responded well to single leg flamengo for balance and stability and demonstrated good squatting technique though limited depth secondary to limited gastroc/soleus mobility PT Treatment/Interventions: Therapeutic activities;Therapeutic exercise;Balance training;Neuromuscular re-education;Patient/family education;Gait training;Stair training;Functional mobility training;Manual techniques;Modalities PT Plan: Focus to be on improving ankle ROM in  closed and open chain positions and improve ankle stabilization and gastroc strength, initiate 3 way flamengo next session, and progress squatting to include weights and increased variety of positions.      Goals PT Short Term Goals PT Short Term Goal 1: Pt will report present with minimal fascial restriction and muscle spasms to incision and gastroc region to report pain less than 2/10 when walking.  PT Short Term Goal 1 - Progress: Progressing toward goal PT Short Term Goal 2: Pt will improve his ankle plantarlfexion to 40 degress to prepare forimproved stride length during giat  PT Short Term Goal 2 - Progress: Progressing toward goal PT Short Term Goal 3: Pt will improve his Lt ankle strength to 3/5 muscle grade to prepare for independent walking in normal shoes.  PT Short Term Goal 3 - Progress: Progressing toward goal PT Long Term Goals PT Long Term Goal 2: Pt will improve his Lt ankle strength to 4/5 muscle grade to perform walking/and standing on tos to reach to high shelf PT Long Term Goal 2 - Progress: Progressing toward goal Long Term Goal 3: Pt will improve Lt ankle AROM to [facility] in order to ascend and descend steps with no difficulty  Long Term Goal 3 Progress: Progressing toward goal Long Term Goal 4: patient to ambulate 30 min with no pain for community related activities  Long Term Goal 4 Progress: Progressing toward goal PT Long Term Goal 5: Pt will improve his Lt ankle strength to 5/5 muscle grade to prepare return to sport/basketball Long Term Goal 5 Progress: Progressing toward goal  Problem List Patient Active Problem List   Diagnosis Date Noted  . Pain in joint, ankle and foot 01/20/2013  . Left leg weakness 01/20/2013  . CARPAL TUNNEL SYNDROME, HX OF 02/14/2009  . MONONEURITIS OF UNSPECIFIED SITE 11/22/2008  . 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 TENDINITIS, RIGHT  WRIST 11/22/2008  . NECK PAIN 08/14/2008    PT - End of Session Activity  Tolerance: Patient limited by pain General Behavior During Therapy: WFL for tasks assessed/performed PT Plan of Care PT Home Exercise Plan: 3D ankle excursion with towel to increase calcaneal eversion, 1 way flamengo and squat matrix  GP Functional Assessment Tool Used: foto STATUS 45% LIMITATION 55% Functional Limitation: Mobility: Walking and moving around Mobility: Walking and Moving Around Current Status (K5997): At least 40 percent but less than 60 percent impaired, limited or restricted Mobility: Walking and Moving Around Goal Status [phone]): At least 20 percent but less than 40 percent impaired, limited or restricted  Joseph Pruitt Joseph Pruitt DPT PT 07/20/2013, 8:55 AM

## 2013-07-27 ENCOUNTER — Ambulatory Visit (HOSPITAL_COMMUNITY)
Admission: RE | Admit: 2013-07-27 | Discharge: 2013-07-27 | Disposition: A | Discharge status: Home / Self Care | Payer: Medicare Other | Source: Ambulatory Visit | Attending: Orthopedic Surgery | Admitting: Orthopedic Surgery

## 2013-07-27 DIAGNOSIS — M25579 Pain in unspecified ankle and joints of unspecified foot: Secondary | ICD-10-CM

## 2013-07-27 DIAGNOSIS — R29898 Other symptoms and signs involving the musculoskeletal system: Secondary | ICD-10-CM

## 2013-07-27 NOTE — Progress Notes (Signed)
Physical Therapy Treatment Patient Details  Name: Joseph Pruitt MRN: [id number] Date of Birth: [birth year]  Today's Date: 07/27/2013 Time: 0810-0857 PT Time Calculation (min): 55 min Charge: TE [id number] Manual [id number]  Visit#: 3 of 16  Re-eval: 08/11/13 Assessment Diagnosis: Rt achilles surgery Surgical Date: 11/05/12 Next MD Visit: Dr.Ostrum -   Authorization: Medicare  Authorization Time Period: new orders and re assessment for ongoing care 04/21/2013  , had completed 13 visits prior with last treatment date on 1/03/12/2013   Authorization Visit#: 3 of 16   Subjective: Symptoms/Limitations Symptoms: Pt stated Lt ankle pain scale 2/10 today  Pain Assessment Currently in Pain?: Yes Pain Score: 2  Pain Location: Ankle Pain Orientation: Left  Objective:   Exercise/Treatments Ankle Stretches Other Stretch: 3D ankle excursion, 10x 3second each with towel under heel to improve eversion Ankle Exercises - Standing BAPS: Level 3;Standing;Limitations BAPS Limitations: 20 reps  Rocker Board: 2 minutes Rocker Board Limitations: R/L A/P no HHA Other Standing Ankle Exercises: 3 Way flamengo 10x Other Standing Ankle Exercises: Squat SFT matrix 10x each    Manual Therapy Manual Therapy: Myofascial release Myofascial Release: TE [id number] Manual [id number] Other Manual Therapy: ice massage scar site 2 min, calcaneal mobs all planes  Physical Therapy Assessment and Plan PT Assessment and Plan Clinical Impression Statement: Session focus on improving ROM, increasing flexibilty and pain control.  Added 3way flamengo to improve inversion/eversion with noted impaired motor control with new movements, therapist facilitation to improve form and technique.  Pt demonstrated good squatting form and technique with minimal cueing required.  Ended session with manual techniques to reduce fascial restrictions and improve calcaneal  mobility, ice massage at end for pain control.  Pt reported pain  reduced at end of session. PT Plan: Focus to be on improving ankle ROM in closed and open chain positions and improve ankle stabilization and gastroc strength, continue with 3 way flamengo next session, and progress squatting to include weights and increased variety of positions.      Goals PT Short Term Goals PT Short Term Goal 1: Pt will report present with minimal fascial restriction and muscle spasms to incision and gastroc region to report pain less than 2/10 when walking.  PT Short Term Goal 1 - Progress: Progressing toward goal PT Short Term Goal 2: Pt will improve his ankle plantarlfexion to 40 degress to prepare forimproved stride length during giat  PT Short Term Goal 2 - Progress: Progressing toward goal PT Short Term Goal 3: Pt will improve his Lt ankle strength to 3/5 muscle grade to prepare for independent walking in normal shoes.  PT Short Term Goal 3 - Progress: Progressing toward goal PT Long Term Goals PT Long Term Goal 2: Pt will improve his Lt ankle strength to 4/5 muscle grade to perform walking/and standing on tos to reach to high shelf Long Term Goal 3: Pt will improve Lt ankle AROM to [facility] in order to ascend and descend steps with no difficulty  Long Term Goal 4: patient to ambulate 30 min with no pain for community related activities  PT Long Term Goal 5: Pt will improve his Lt ankle strength to 5/5 muscle grade to prepare return to sport/basketball  Problem List Patient Active Problem List   Diagnosis Date Noted  . Pain in joint, ankle and foot 01/20/2013  . Left leg weakness 01/20/2013  . CARPAL TUNNEL SYNDROME, HX OF 02/14/2009  . MONONEURITIS OF UNSPECIFIED SITE 11/22/2008  . 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  TENDINITIS, RIGHT WRIST 11/22/2008  . NECK PAIN 08/14/2008    PT - End of Session Activity Tolerance: Patient tolerated treatment well General Behavior During Therapy: [facility] for tasks assessed/performed  GP    [name] 07/27/2013, 9:05 AM

## 2013-07-28 ENCOUNTER — Inpatient Hospital Stay (HOSPITAL_COMMUNITY): Admission: RE | Admit: 2013-07-28 | Payer: Medicare Other | Source: Ambulatory Visit

## 2013-08-02 ENCOUNTER — Inpatient Hospital Stay (HOSPITAL_COMMUNITY): Admission: RE | Admit: 2013-08-02 | Payer: Medicare Other | Source: Ambulatory Visit

## 2013-08-04 ENCOUNTER — Telehealth (HOSPITAL_COMMUNITY): Payer: Self-pay

## 2013-08-04 ENCOUNTER — Ambulatory Visit (HOSPITAL_COMMUNITY): Payer: Medicare Other | Admitting: *Deleted

## 2013-08-09 ENCOUNTER — Ambulatory Visit (HOSPITAL_COMMUNITY)
Admission: RE | Admit: 2013-08-09 | Discharge: 2013-08-09 | Disposition: A | Discharge status: Home / Self Care | Payer: Medicare Other | Source: Ambulatory Visit | Attending: Orthopedic Surgery | Admitting: Orthopedic Surgery

## 2013-08-09 DIAGNOSIS — M25476 Effusion, unspecified foot: Secondary | ICD-10-CM | POA: Diagnosis not present

## 2013-08-09 DIAGNOSIS — M25579 Pain in unspecified ankle and joints of unspecified foot: Secondary | ICD-10-CM | POA: Diagnosis not present

## 2013-08-09 DIAGNOSIS — M6281 Muscle weakness (generalized): Secondary | ICD-10-CM | POA: Diagnosis not present

## 2013-08-09 DIAGNOSIS — R29898 Other symptoms and signs involving the musculoskeletal system: Secondary | ICD-10-CM

## 2013-08-09 DIAGNOSIS — R262 Difficulty in walking, not elsewhere classified: Secondary | ICD-10-CM | POA: Insufficient documentation

## 2013-08-09 DIAGNOSIS — M25473 Effusion, unspecified ankle: Secondary | ICD-10-CM | POA: Insufficient documentation

## 2013-08-09 DIAGNOSIS — IMO0001 Reserved for inherently not codable concepts without codable children: Secondary | ICD-10-CM | POA: Diagnosis present

## 2013-08-09 NOTE — Progress Notes (Signed)
Physical Therapy Treatment Patient Details  Name: Joseph Pruitt MRN: [id number] Date of Birth: 1966/01/14  Today's Date: 08/09/2013 Time: 0930-1015 PT Time Calculation (min): 45 min Charge TE 0930-1000, Manual 1000-1015  Visit#: 4 of 16  Re-eval: 08/11/13 Assessment Diagnosis: Rt achilles surgery Surgical Date: 11/05/12 Next MD Visit: Dr.Ostrum -   Authorization: Medicare  Authorization Time Period: new orders and re assessment for ongoing care 04/21/2013  , had completed 13 visits prior with last treatment date on 1/03/12/2013   Authorization Visit#: 4 of 16   Subjective: Symptoms/Limitations Symptoms: Lt ankle feeling good, pain scale 2/10 today.   Pain Assessment Currently in Pain?: Yes Pain Score: 2  Pain Location: Ankle Pain Orientation: Left  Objective:   Exercise/Treatments Ankle Stretches Slant Board Stretch: 3 reps;30 seconds Other Stretch: 3D ankle excursion, 10x 3second each with towel under heel to improve eversion Ankle Exercises - Standing BAPS: Level 3;Standing;Limitations BAPS Limitations: 20 reps  Other Standing Ankle Exercises: 3 Way flamengo 10x Other Standing Ankle Exercises: Squat SFT matrix 10x each Modalities Modalities: Cryotherapy Manual Therapy Manual Therapy: Myofascial release Joint Mobilization: MFR to gastroc and soleus region,cacaneal joint mobs to improve eversion grade II-III Cryotherapy Number Minutes Cryotherapy: 8 Minutes Cryotherapy Location: Ankle Type of Cryotherapy: Ice massage  Physical Therapy Assessment and Plan PT Assessment and Plan Clinical Impression Statement: Session focus improving ROM, flexibilty and exercises/manual techniques to improve calcaneal mobility to increase eversion.  Pt able to demonstrate appropriate form and technique with exercises wtih min cueing required to increase ankle ROM.  Manual technqiues complete at end of session to improve calcaneal mobility with ice massage at end of pain control.  Pt stated  pain free at end of sessin.   PT Plan: Focus to be on improving ankle ROM in closed and open chain positions and improve ankle stabilization and gastroc strength, continue with 3 way flamengo next session, and progress squatting to include weights and increased variety of positions.      Goals PT Short Term Goals PT Short Term Goal 1: Pt will report present with minimal fascial restriction and muscle spasms to incision and gastroc region to report pain less than 2/10 when walking.  PT Short Term Goal 1 - Progress: Progressing toward goal PT Short Term Goal 2: Pt will improve his ankle plantarlfexion to 40 degress to prepare forimproved stride length during giat  PT Short Term Goal 2 - Progress: Progressing toward goal PT Short Term Goal 3: Pt will improve his Lt ankle strength to 3/5 muscle grade to prepare for independent walking in normal shoes.  PT Short Term Goal 3 - Progress: Progressing toward goal PT Long Term Goals PT Long Term Goal 2: Pt will improve his Lt ankle strength to 4/5 muscle grade to perform walking/and standing on tos to reach to high shelf Long Term Goal 3: Pt will improve Lt ankle AROM to [facility] in order to ascend and descend steps with no difficulty  Long Term Goal 4: patient to ambulate 30 min with no pain for community related activities  PT Long Term Goal 5: Pt will improve his Lt ankle strength to 5/5 muscle grade to prepare return to sport/basketball  Problem List Patient Active Problem List   Diagnosis Date Noted  . Pain in joint, ankle and foot 01/20/2013  . Left leg weakness 01/20/2013  . CARPAL TUNNEL SYNDROME, HX OF 02/14/2009  . MONONEURITIS OF UNSPECIFIED SITE 11/22/2008  . 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 TENDINITIS, RIGHT WRIST 11/22/2008  .  NECK PAIN 08/14/2008    PT - End of Session Activity Tolerance: Patient tolerated treatment well General Behavior During Therapy: [facility] for tasks assessed/performed  GP    [name] 08/09/2013, 12:08 PM

## 2013-08-10 ENCOUNTER — Encounter (HOSPITAL_COMMUNITY): Payer: Self-pay | Admitting: Emergency Medicine

## 2013-08-10 ENCOUNTER — Emergency Department (HOSPITAL_COMMUNITY)
Admission: EM | Admit: 2013-08-10 | Discharge: 2013-08-10 | Disposition: A | Discharge status: Home / Self Care | Payer: Medicare Other | Attending: Emergency Medicine | Admitting: Emergency Medicine

## 2013-08-10 DIAGNOSIS — K259 Gastric ulcer, unspecified as acute or chronic, without hemorrhage or perforation: Secondary | ICD-10-CM | POA: Insufficient documentation

## 2013-08-10 DIAGNOSIS — L259 Unspecified contact dermatitis, unspecified cause: Secondary | ICD-10-CM

## 2013-08-10 DIAGNOSIS — L255 Unspecified contact dermatitis due to plants, except food: Secondary | ICD-10-CM | POA: Insufficient documentation

## 2013-08-10 DIAGNOSIS — Z79899 Other long term (current) drug therapy: Secondary | ICD-10-CM | POA: Insufficient documentation

## 2013-08-10 MED ORDER — DEXAMETHASONE SODIUM PHOSPHATE 10 MG/ML IJ SOLN
6.0000 mg | Freq: Once | INTRAMUSCULAR | Status: AC
  Administered 2013-08-10: 6 mg via INTRAMUSCULAR
  Filled 2013-08-10: qty 1

## 2013-08-10 MED ORDER — HYDROCORTISONE 1 % EX CREA: TOPICAL_CREAM | CUTANEOUS | Status: DC

## 2013-08-10 NOTE — ED Notes (Signed)
Pt. Reports being exposed to poison oak x1 week. Pt. Reports increased itching to whole body.

## 2013-08-10 NOTE — ED Notes (Signed)
MD at bedside. 

## 2013-08-10 NOTE — Discharge Instructions (Signed)
You can apply the cream to the rash twice daily - benadryl for itching - see list of family doctors below for follow up  [facility] Primary Care Doctor List    Sinda Du MD. Specialty: Pulmonary Disease Contact information: [place]  [place] [address]  [phone]   Tula Nakayama, MD. Specialty: [facility] Contact information: [address], [address] [address]  [phone]   Sallee Lange, MD. Specialty: [facility] Contact information: [address]  [place] [address]  [phone]   Rosita Fire, MD Specialty: Internal Medicine Contact information: [place] [address]  [phone]   Delphina Cahill, MD. Specialty: Internal Medicine Contact information: [place] [address]  [phone]   Marjean Donna, MD. Specialty: Family Medicine Contact information: [place] [address]  [phone]   Leslie Andrea, MD. Specialty: [facility] Contact information: [place] [address]  [phone]   Asencion Noble, MD. Specialty: Internal Medicine Contact information: [address]  [place] [address]  [phone]

## 2013-08-10 NOTE — ED Provider Notes (Signed)
CSN: [id number]     Arrival date & time 08/10/13  0621 History   None    Chief Complaint  Patient presents with  . Rash     (Consider location/radiation/quality/duration/timing/severity/associated sxs/prior Treatment) HPI Comments: Rash, onset last week when exposed to poison oak - was wearing shorts when doing yard work - since he has had mild rash on legs and scattered lesions on forearms - itchy, persistent, did not improve when he tried to apply rubbing alcohol in the shower.  No other creams or meds have been given, he has no difficulty breathing or swelling.  He states he has had a steroid shot in the past which helped and is insistent on having the same med today.  Patient is a 48 y.o. male presenting with rash. The history is provided by the patient and medical records.  Rash Associated symptoms: no shortness of breath     Past Medical History  Diagnosis Date  . Stomach ulcer    Past Surgical History  Procedure Laterality Date  . Spine surgery    . Hand surgery    . Achilles tendon surgery     No family history on file. History  Substance Use Topics  . Smoking status: Never Smoker   . Smokeless tobacco: Not on file  . Alcohol Use: No    Review of Systems  Respiratory: Negative for shortness of breath.   Skin: Positive for rash.      Allergies  Review of patient's allergies indicates no known allergies.  Home Medications   Prior to Admission medications   Medication Sig Start Date End Date Taking? Authorizing Provider  oxyCODONE-acetaminophen (PERCOCET) 5-325 MG per tablet Take 1 tablet by mouth every 6 (six) hours as needed. Pain   Yes Historical Provider, MD  hydrocortisone cream 1 % Apply to affected area 2 times daily 08/10/13   Johnna Acosta, MD  omeprazole (PRILOSEC) 40 MG capsule Take 40 mg by mouth daily.    Historical Provider, MD  oxyCODONE-acetaminophen (PERCOCET/ROXICET) 5-325 MG per tablet Take 1-2 tablets by mouth every 6 (six) hours as needed  for pain. 12/23/11   Nat Christen, MD  promethazine (PHENERGAN) 25 MG tablet Take 1 tablet (25 mg total) by mouth every 6 (six) hours as needed for nausea. 12/23/11   Nat Christen, MD  traMADol (ULTRAM) 50 MG tablet Take 1 tablet (50 mg total) by mouth every 6 (six) hours as needed for pain. 12/03/11   Prentiss Bells, MD   BP 125/82  Pulse 69  Temp(Src) 97.7 F (36.5 C) (Oral)  Resp 20  Ht 6\' 1"  (1.854 m)  Wt 245 lb (111.131 kg)  BMI 32.33 kg/m2  SpO2 100% Physical Exam  Constitutional: He appears well-developed and well-nourished. No distress.  HENT:  Head: Normocephalic and atraumatic.  Mouth/Throat: Oropharynx is clear and moist.  Eyes: Conjunctivae are normal. Right eye exhibits no discharge. Left eye exhibits no discharge. No scleral icterus.  Neurological: He is alert. Coordination normal.  Gait normal, speech clear  Skin: Skin is warm and dry. Rash noted. He is not diaphoretic. Erythema: scattered scabbed rash to the legs and forearms - few and far between.  No classical linear patterns, no vesciles, no petechia or purpura    ED Course  Procedures (including critical care time) Labs Review Labs Reviewed - No data to display  Imaging Review No results found.    MDM   Final diagnoses:  Contact dermatitis    Pt has possible contact  derm exposure - well appearing, will give steroid shot at pt's insistence, recommended benadryl PRN and topical hydrocortisone - pt in agreement.  Meds given in ED:  Medications  dexamethasone (DECADRON) injection 6 mg (not administered)    New Prescriptions   HYDROCORTISONE CREAM 1 %    Apply to affected area 2 times daily        Johnna Acosta, MD 08/10/13 [phone]

## 2013-08-11 ENCOUNTER — Ambulatory Visit (HOSPITAL_COMMUNITY)
Admission: RE | Admit: 2013-08-11 | Discharge: 2013-08-11 | Disposition: A | Discharge status: Home / Self Care | Payer: Medicare Other | Source: Ambulatory Visit | Attending: Orthopedic Surgery | Admitting: Orthopedic Surgery

## 2013-08-11 DIAGNOSIS — IMO0001 Reserved for inherently not codable concepts without codable children: Secondary | ICD-10-CM | POA: Diagnosis not present

## 2013-08-11 DIAGNOSIS — R29898 Other symptoms and signs involving the musculoskeletal system: Secondary | ICD-10-CM

## 2013-08-11 DIAGNOSIS — M25579 Pain in unspecified ankle and joints of unspecified foot: Secondary | ICD-10-CM

## 2013-08-11 NOTE — Evaluation (Addendum)
Physical Therapy Reassessment  Patient Details  Name: Joseph Pruitt MRN: [id number] Date of Birth: 08-Nov-1965  Today's Date: 08/11/2013 Time: 0800-0845 PT Time Calculation (min): 45 min    Charges: TherEx 800-830, Manual [phone]          Visit#: 5 of 16  Re-eval: 09/10/13 Assessment Diagnosis: Rt achilles surgery Surgical Date: 11/05/12 Next MD Visit: Dr.Ostrum -   Authorization: Medicare    Authorization Time Period: new orders and re assessment for ongoing care 04/21/2013  , had completed 13 visits prior with last treatment date on 06/10/2013   Authorization Visit#: 5 of 16   Past Medical History:  Past Medical History  Diagnosis Date  . Stomach ulcer    Past Surgical History:  Past Surgical History  Procedure Laterality Date  . Spine surgery    . Hand surgery    . Achilles tendon surgery      Subjective Symptoms/Limitations Symptoms: Lt ankle pain recently increased with increased complaint in heel during ankle dorsiflexion stretches Pain Assessment Currently in Pain?: Yes Pain Score: 2  Pain Location: Ankle Pain Orientation: Left;Posterior  Sensation/Coordination/Flexibility/Functional Tests Functional Tests Functional Tests: Calcaneal inversion: 24 degrees, -2 eversion.   Assessment LLE AROM (degrees) Left Ankle Dorsiflexion: 17 Left Ankle Plantar Flexion: 45 Left Ankle Inversion: 42 Left Ankle Eversion: 18 LLE Strength Left Ankle Dorsiflexion: 5/5 Left Ankle Plantar Flexion: 3+/5 Left Ankle Inversion: 5/5 Left Ankle Eversion: 5/5  Exercise/Treatments Ankle Stretches Slant Board Stretch: 3 reps;30 seconds Other Stretch: 3D ankle excursion, 10x 3second each with towel under heel to improve eversion Other Stretch: 3D ankle excursion at wall. 10x   Clockwise and counter clockwise ankle excursion on BOSU 10x Ankle Exercises - Standing Rocker Board: 2 minutes Rocker Board Limitations: R/L A/P no HHA Other Standing Ankle Exercises: 3 Way flamengo  10x  Manual Therapy Joint Mobilization: MFR to gastroc and soleus region,cacaneal joint mobs to improve eversion grade II-III  Physical Therapy Assessment and Plan PT Assessment and Plan Clinical Impression Statement: Reassesment completed this session, During palpation of ankle patient displays significant point tenderness in achilles/ calcaneous which feels like an abnormal boney protrusion. Overall patient displays improving AROM , but cotinues to have limited calcaneal eversion that when improved with manual techniques decreases patient's pain and improved gait with improved tibial internal rotation and improved talus abduction at transition zone 1 of gait. Patient will benefit from continues skilled physical therapy with conitnued fouce on improving calaneal eversion and talocrural mobility.  patient has met all short term goals and is progressing towards long term goals.  Pt will benefit from skilled therapeutic intervention in order to improve on the following deficits: Impaired flexibility;Abnormal gait;Decreased activity tolerance;Decreased balance;Decreased range of motion;Increased fascial restricitons;Difficulty walking;Decreased strength;Pain Rehab Potential: Fair Clinical Impairments Affecting Rehab Potential: ongoing achilles pain  PT Frequency: Min 2X/week PT Duration: 8 weeks PT Treatment/Interventions: Therapeutic activities;Therapeutic exercise;Balance training;Neuromuscular re-education;Patient/family education;Gait training;Stair training;Functional mobility training;Manual techniques;Modalities PT Plan: Focus to be on improving ankle ROM in closed and open chain positions and improve ankle stabilization and gastroc strength, continue with 3 way flamengo next session, and progress squatting to include weights and increased variety of positions. To further improve gait utilize lunging and calf stretching with manual techniques and wedging  to facilitate increased calcaneal eversion  and talus abduction and internal rotation.     Goals PT Short Term Goals PT Short Term Goal 1: Pt will report present with minimal fascial restriction and muscle spasms to incision and gastroc region to  report pain less than 2/10 when walking.  PT Short Term Goal 1 - Progress: Progressing toward goal PT Short Term Goal 2: Pt will improve his ankle plantarlfexion to 40 degress to prepare forimproved stride length during giat  PT Short Term Goal 2 - Progress: Met PT Short Term Goal 3: Pt will improve his Lt ankle strength to 3/5 muscle grade to prepare for independent walking in normal shoes.  PT Short Term Goal 3 - Progress: Met PT Short Term Goal 4: patient to report intermitent rather than constant pain in sitting  PT Short Term Goal 4 - Progress: Met PT Long Term Goals PT Long Term Goal 1: Patient will improve ankle dorsiflexion ROM to 20 degrees to demosntrate increased achilles/gastroc mobility to increase stride length. PT Long Term Goal 1 - Progress: Progressing toward goal PT Long Term Goal 2: Pt will improve his Lt ankle strength to 4/5 muscle grade to perform walking/and standing on tos to reach to high shelf PT Long Term Goal 2 - Progress: Progressing toward goal Long Term Goal 3: Pt will improve Lt ankle AROM to [facility] in order to ascend and descend steps with no difficulty  Long Term Goal 3 Progress: Progressing toward goal Long Term Goal 4: patient to ambulate 30 min with no pain for community related activities  Long Term Goal 4 Progress: Progressing toward goal PT Long Term Goal 5: Pt will improve his Lt ankle strength to 5/5 muscle grade to prepare return to sport/basketball Long Term Goal 5 Progress: Progressing toward goal  Problem List Patient Active Problem List   Diagnosis Date Noted  . Pain in joint, ankle and foot 01/20/2013  . Left leg weakness 01/20/2013  . CARPAL TUNNEL SYNDROME, HX OF 02/14/2009  . MONONEURITIS OF UNSPECIFIED SITE 11/22/2008  . SPONDYLOSIS,  CERVICAL, WITH RADICULOPATHY 11/22/2008  . TENDINITIS, RIGHT WRIST 11/22/2008  . NECK PAIN 08/14/2008    PT - End of Session Activity Tolerance: Patient tolerated treatment well;Patient limited by pain General Behavior During Therapy: WFL for tasks assessed/performed  GP   Functional Assessment Tool Used: foto was STATUS 45% LIMITATION 55%, now 51% status 49% limited Functional Limitation: Mobility: Walking and moving around Mobility: Walking and Moving Around Current Status ([id number]): At least 40 percent but less than 60 percent impaired, limited or restricted Mobility: Walking and Moving Around Goal Status [phone]): At least 20 percent but less than 40 percent impaired, limited or restricted  Cash R Dewitt 08/11/2013, 9:09 AM  Physician Documentation Your signature is required to indicate approval of the treatment plan as stated above.  Please sign and either send electronically or make a copy of this report for your files and return this physician signed original.   Please mark one 1.__approve of plan  2. ___approve of plan with the following conditions.   ______________________________                                                          _____________________ Physician Signature  Date sbnr

## 2013-08-14 ENCOUNTER — Emergency Department (HOSPITAL_COMMUNITY)
Admission: EM | Admit: 2013-08-14 | Discharge: 2013-08-14 | Disposition: A | Discharge status: Home / Self Care | Payer: Medicare Other | Attending: Emergency Medicine | Admitting: Emergency Medicine

## 2013-08-14 ENCOUNTER — Emergency Department (HOSPITAL_COMMUNITY): Payer: Medicare Other

## 2013-08-14 ENCOUNTER — Encounter (HOSPITAL_COMMUNITY): Payer: Self-pay | Admitting: Emergency Medicine

## 2013-08-14 DIAGNOSIS — Z79899 Other long term (current) drug therapy: Secondary | ICD-10-CM | POA: Insufficient documentation

## 2013-08-14 DIAGNOSIS — Y9389 Activity, other specified: Secondary | ICD-10-CM | POA: Insufficient documentation

## 2013-08-14 DIAGNOSIS — S0083XA Contusion of other part of head, initial encounter: Principal | ICD-10-CM | POA: Insufficient documentation

## 2013-08-14 DIAGNOSIS — G43909 Migraine, unspecified, not intractable, without status migrainosus: Secondary | ICD-10-CM | POA: Insufficient documentation

## 2013-08-14 DIAGNOSIS — S0003XA Contusion of scalp, initial encounter: Secondary | ICD-10-CM | POA: Insufficient documentation

## 2013-08-14 DIAGNOSIS — S0093XA Contusion of unspecified part of head, initial encounter: Secondary | ICD-10-CM

## 2013-08-14 DIAGNOSIS — S1093XA Contusion of unspecified part of neck, initial encounter: Principal | ICD-10-CM

## 2013-08-14 DIAGNOSIS — G8929 Other chronic pain: Secondary | ICD-10-CM | POA: Insufficient documentation

## 2013-08-14 DIAGNOSIS — W208XXA Other cause of strike by thrown, projected or falling object, initial encounter: Secondary | ICD-10-CM | POA: Insufficient documentation

## 2013-08-14 DIAGNOSIS — Y9289 Other specified places as the place of occurrence of the external cause: Secondary | ICD-10-CM | POA: Insufficient documentation

## 2013-08-14 DIAGNOSIS — Z8719 Personal history of other diseases of the digestive system: Secondary | ICD-10-CM | POA: Insufficient documentation

## 2013-08-14 MED ORDER — SODIUM CHLORIDE 0.9 % IV SOLN: 1000.0000 mL | INTRAVENOUS | Status: DC

## 2013-08-14 MED ORDER — DIPHENHYDRAMINE HCL 50 MG/ML IJ SOLN
25.0000 mg | Freq: Once | INTRAMUSCULAR | Status: AC
  Administered 2013-08-14: 25 mg via INTRAVENOUS
  Filled 2013-08-14: qty 1

## 2013-08-14 MED ORDER — METOCLOPRAMIDE HCL 5 MG/ML IJ SOLN
10.0000 mg | Freq: Once | INTRAMUSCULAR | Status: AC
  Administered 2013-08-14: 10 mg via INTRAVENOUS
  Filled 2013-08-14: qty 2

## 2013-08-14 MED ORDER — SODIUM CHLORIDE 0.9 % IV SOLN
1000.0000 mL | Freq: Once | INTRAVENOUS | Status: AC
  Administered 2013-08-14: 1000 mL via INTRAVENOUS
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
## 2013-08-16 ENCOUNTER — Ambulatory Visit (HOSPITAL_COMMUNITY): Payer: Medicare Other | Admitting: Physical Therapy

## 2013-08-17 ENCOUNTER — Inpatient Hospital Stay (HOSPITAL_COMMUNITY): Admission: RE | Admit: 2013-08-17 | Payer: Medicare Other | Source: Ambulatory Visit

## 2013-08-23 ENCOUNTER — Emergency Department (HOSPITAL_COMMUNITY): Payer: Medicare Other

## 2013-08-23 ENCOUNTER — Encounter (HOSPITAL_COMMUNITY): Payer: Self-pay | Admitting: Emergency Medicine

## 2013-08-23 ENCOUNTER — Emergency Department (HOSPITAL_COMMUNITY)
Admission: EM | Admit: 2013-08-23 | Discharge: 2013-08-23 | Disposition: A | Discharge status: Home / Self Care | Payer: Medicare Other | Attending: Emergency Medicine | Admitting: Emergency Medicine

## 2013-08-23 DIAGNOSIS — Y9389 Activity, other specified: Secondary | ICD-10-CM | POA: Diagnosis not present

## 2013-08-23 DIAGNOSIS — S63509A Unspecified sprain of unspecified wrist, initial encounter: Secondary | ICD-10-CM | POA: Diagnosis not present

## 2013-08-23 DIAGNOSIS — S59909A Unspecified injury of unspecified elbow, initial encounter: Secondary | ICD-10-CM | POA: Diagnosis present

## 2013-08-23 DIAGNOSIS — S298XXA Other specified injuries of thorax, initial encounter: Secondary | ICD-10-CM | POA: Diagnosis not present

## 2013-08-23 DIAGNOSIS — Z79899 Other long term (current) drug therapy: Secondary | ICD-10-CM | POA: Insufficient documentation

## 2013-08-23 DIAGNOSIS — IMO0002 Reserved for concepts with insufficient information to code with codable children: Secondary | ICD-10-CM | POA: Diagnosis not present

## 2013-08-23 DIAGNOSIS — G43909 Migraine, unspecified, not intractable, without status migrainosus: Secondary | ICD-10-CM | POA: Insufficient documentation

## 2013-08-23 DIAGNOSIS — S63502A Unspecified sprain of left wrist, initial encounter: Secondary | ICD-10-CM

## 2013-08-23 DIAGNOSIS — S0993XA Unspecified injury of face, initial encounter: Secondary | ICD-10-CM | POA: Insufficient documentation

## 2013-08-23 DIAGNOSIS — S6990XA Unspecified injury of unspecified wrist, hand and finger(s), initial encounter: Secondary | ICD-10-CM | POA: Diagnosis present

## 2013-08-23 DIAGNOSIS — Y92410 Unspecified street and highway as the place of occurrence of the external cause: Secondary | ICD-10-CM | POA: Insufficient documentation

## 2013-08-23 DIAGNOSIS — Z791 Long term (current) use of non-steroidal anti-inflammatories (NSAID): Secondary | ICD-10-CM | POA: Diagnosis not present

## 2013-08-23 DIAGNOSIS — M549 Dorsalgia, unspecified: Secondary | ICD-10-CM

## 2013-08-23 DIAGNOSIS — M542 Cervicalgia: Secondary | ICD-10-CM

## 2013-08-23 DIAGNOSIS — S199XXA Unspecified injury of neck, initial encounter: Secondary | ICD-10-CM

## 2013-08-23 DIAGNOSIS — Z8719 Personal history of other diseases of the digestive system: Secondary | ICD-10-CM | POA: Diagnosis not present

## 2013-08-23 MED ORDER — SODIUM CHLORIDE 0.9 % IV BOLUS (SEPSIS)
1000.0000 mL | Freq: Once | INTRAVENOUS | Status: AC
  Administered 2013-08-23: 1000 mL via INTRAVENOUS

## 2013-08-23 MED ORDER — METOCLOPRAMIDE HCL 5 MG/ML IJ SOLN
10.0000 mg | Freq: Once | INTRAMUSCULAR | Status: AC
  Administered 2013-08-23: 10 mg via INTRAVENOUS
  Filled 2013-08-23: qty 2

## 2013-08-23 MED ORDER — NAPROXEN 500 MG PO TABS: 500.0000 mg | ORAL_TABLET | Freq: Two times a day (BID) | ORAL | Status: DC

## 2013-08-23 MED ORDER — CYCLOBENZAPRINE HCL 10 MG PO TABS: 10.0000 mg | ORAL_TABLET | Freq: Three times a day (TID) | ORAL | Status: DC | PRN

## 2013-08-23 MED ORDER — KETOROLAC TROMETHAMINE 30 MG/ML IJ SOLN
30.0000 mg | Freq: Once | INTRAMUSCULAR | Status: AC
  Administered 2013-08-23: 30 mg via INTRAVENOUS
  Filled 2013-08-23: qty 1

## 2013-08-23 MED ORDER — DIPHENHYDRAMINE HCL 50 MG/ML IJ SOLN
25.0000 mg | Freq: Once | INTRAMUSCULAR | Status: AC
  Administered 2013-08-23: 25 mg via INTRAVENOUS
  Filled 2013-08-23: qty 1
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
## 2013-08-23 NOTE — Discharge Instructions (Signed)
Ice packs to the injured or sore muscles for the next several days then start using heat. Take the medications for pain and muscle spasms. Return to the ED for any problems listed on the head injury sheet. Recheck if you aren't improving in the next week by your orthopedist at [facility].
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
## 2013-08-24 ENCOUNTER — Ambulatory Visit (HOSPITAL_COMMUNITY)
Admission: RE | Admit: 2013-08-24 | Discharge: 2013-08-24 | Disposition: A | Discharge status: Home / Self Care | Payer: Medicare Other | Source: Ambulatory Visit | Attending: Orthopedic Surgery | Admitting: Orthopedic Surgery

## 2013-08-24 DIAGNOSIS — R29898 Other symptoms and signs involving the musculoskeletal system: Secondary | ICD-10-CM

## 2013-08-24 DIAGNOSIS — IMO0001 Reserved for inherently not codable concepts without codable children: Secondary | ICD-10-CM | POA: Diagnosis not present

## 2013-08-24 DIAGNOSIS — M25579 Pain in unspecified ankle and joints of unspecified foot: Secondary | ICD-10-CM
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
## 2013-08-26 ENCOUNTER — Inpatient Hospital Stay (HOSPITAL_COMMUNITY): Admission: RE | Admit: 2013-08-26 | Payer: Medicare Other | Source: Ambulatory Visit | Admitting: Physical Therapy

## 2013-08-29 ENCOUNTER — Inpatient Hospital Stay (HOSPITAL_COMMUNITY): Admission: RE | Admit: 2013-08-29 | Payer: Medicare Other | Source: Ambulatory Visit

## 2013-08-31 ENCOUNTER — Ambulatory Visit (HOSPITAL_COMMUNITY)
Admission: RE | Admit: 2013-08-31 | Discharge: 2013-08-31 | Disposition: A | Discharge status: Home / Self Care | Payer: Medicare Other | Source: Ambulatory Visit | Attending: Orthopedic Surgery | Admitting: Orthopedic Surgery

## 2013-08-31 DIAGNOSIS — R29898 Other symptoms and signs involving the musculoskeletal system: Secondary | ICD-10-CM

## 2013-08-31 DIAGNOSIS — IMO0001 Reserved for inherently not codable concepts without codable children: Secondary | ICD-10-CM | POA: Diagnosis not present

## 2013-08-31 DIAGNOSIS — M25572 Pain in left ankle and joints of left foot: Secondary | ICD-10-CM
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
## 2013-09-05 ENCOUNTER — Ambulatory Visit (HOSPITAL_COMMUNITY): Payer: Medicare Other | Admitting: Physical Therapy

## 2013-09-06 ENCOUNTER — Emergency Department (HOSPITAL_COMMUNITY)
Admission: EM | Admit: 2013-09-06 | Discharge: 2013-09-07 | Disposition: A | Discharge status: Home / Self Care | Payer: Medicare Other | Attending: Emergency Medicine | Admitting: Emergency Medicine

## 2013-09-06 ENCOUNTER — Encounter (HOSPITAL_COMMUNITY): Payer: Self-pay | Admitting: Emergency Medicine

## 2013-09-06 DIAGNOSIS — Z9889 Other specified postprocedural states: Secondary | ICD-10-CM | POA: Insufficient documentation

## 2013-09-06 DIAGNOSIS — Y9389 Activity, other specified: Secondary | ICD-10-CM | POA: Diagnosis not present

## 2013-09-06 DIAGNOSIS — S139XXA Sprain of joints and ligaments of unspecified parts of neck, initial encounter: Secondary | ICD-10-CM | POA: Insufficient documentation

## 2013-09-06 DIAGNOSIS — Y92410 Unspecified street and highway as the place of occurrence of the external cause: Secondary | ICD-10-CM | POA: Diagnosis not present

## 2013-09-06 DIAGNOSIS — Z8711 Personal history of peptic ulcer disease: Secondary | ICD-10-CM | POA: Insufficient documentation

## 2013-09-06 DIAGNOSIS — S0993XA Unspecified injury of face, initial encounter: Secondary | ICD-10-CM | POA: Insufficient documentation

## 2013-09-06 DIAGNOSIS — S199XXA Unspecified injury of neck, initial encounter: Secondary | ICD-10-CM

## 2013-09-06 DIAGNOSIS — Z791 Long term (current) use of non-steroidal anti-inflammatories (NSAID): Secondary | ICD-10-CM | POA: Insufficient documentation

## 2013-09-06 DIAGNOSIS — S060X0A Concussion without loss of consciousness, initial encounter: Secondary | ICD-10-CM | POA: Diagnosis not present

## 2013-09-06 DIAGNOSIS — S161XXS Strain of muscle, fascia and tendon at neck level, sequela: Secondary | ICD-10-CM

## 2013-09-06 DIAGNOSIS — S0990XA Unspecified injury of head, initial encounter: Secondary | ICD-10-CM | POA: Diagnosis present
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
## 2013-09-07 ENCOUNTER — Ambulatory Visit (HOSPITAL_COMMUNITY)
Admission: RE | Admit: 2013-09-07 | Discharge: 2013-09-07 | Disposition: A | Discharge status: Home / Self Care | Payer: Medicare Other | Source: Ambulatory Visit | Attending: Orthopedic Surgery | Admitting: Orthopedic Surgery

## 2013-09-07 ENCOUNTER — Emergency Department (HOSPITAL_COMMUNITY): Payer: Medicare Other

## 2013-09-07 DIAGNOSIS — M25579 Pain in unspecified ankle and joints of unspecified foot: Secondary | ICD-10-CM | POA: Insufficient documentation

## 2013-09-07 DIAGNOSIS — R262 Difficulty in walking, not elsewhere classified: Secondary | ICD-10-CM | POA: Insufficient documentation

## 2013-09-07 DIAGNOSIS — M25473 Effusion, unspecified ankle: Secondary | ICD-10-CM | POA: Insufficient documentation

## 2013-09-07 DIAGNOSIS — IMO0001 Reserved for inherently not codable concepts without codable children: Secondary | ICD-10-CM | POA: Diagnosis not present

## 2013-09-07 DIAGNOSIS — M25572 Pain in left ankle and joints of left foot: Secondary | ICD-10-CM

## 2013-09-07 DIAGNOSIS — M6281 Muscle weakness (generalized): Secondary | ICD-10-CM | POA: Diagnosis not present

## 2013-09-07 DIAGNOSIS — M25476 Effusion, unspecified foot: Secondary | ICD-10-CM | POA: Insufficient documentation

## 2013-09-07 DIAGNOSIS — S060X0A Concussion without loss of consciousness, initial encounter: Secondary | ICD-10-CM | POA: Diagnosis not present

## 2013-09-07 DIAGNOSIS — R29898 Other symptoms and signs involving the musculoskeletal system: Secondary | ICD-10-CM

## 2013-09-07 MED ORDER — DIAZEPAM 5 MG PO TABS
5.0000 mg | ORAL_TABLET | Freq: Once | ORAL | Status: AC
  Administered 2013-09-07: 5 mg via ORAL
  Filled 2013-09-07: qty 1

## 2013-09-07 MED ORDER — DIAZEPAM 5 MG PO TABS: 5.0000 mg | ORAL_TABLET | Freq: Four times a day (QID) | ORAL | Status: DC | PRN
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
## 2013-09-07 NOTE — Discharge Instructions (Signed)
Stay well-hydrated and avoid exertional activities until you're cleared by her physician. Discuss physical therapy and massage therapy for neck tension. Take Valium as needed for severe muscle tension that is not improved with warm packs however realize that has an addiction potential and make you sleepy and If you were given medicines take as directed.  If you are on coumadin or contraceptives realize their levels and effectiveness is altered by many different medicines.  If you have any reaction (rash, tongues swelling, other) to the medicines stop taking and see a physician.   Please follow up as directed and return to the ER or see a physician for new or worsening symptoms.  Thank you. Filed Vitals:   09/06/13 2341  BP: 153/94  Pulse: 56  Temp: 97.6 F (36.4 C)  TempSrc: Oral  Resp: 18  Height: 6\' 1"  (1.854 m)  Weight: 246 lb (111.585 kg)  SpO2: 96%    Concussion A concussion is a brain injury. It is caused by:  A hit to the head.  A quick and sudden movement (jolt) of the head or neck. A concussion is usually not life-threatening. Even so, it can cause serious problems. If you had a concussion before, you may have concussion-like problems after a hit to your head. HOME CARE General Instructions  Follow your doctor's directions carefully.  Take medicines only as told by your doctor.  Only take medicines your doctor says are safe.  Do not drink alcohol until your doctor says it is okay. Alcohol and some drugs can slow down healing. They can also put you at risk for further injury.  If you are having trouble remembering things, write them down.  Try to do one thing at a time if you get distracted easily. For example, do not watch TV while making dinner.  Talk to your family members or close friends when making important decisions.  Follow up with your doctor as told.  Watch your symptoms. Tell others to do the same. Serious problems can sometimes happen after a  concussion. Older adults are more likely to have these problems.  Tell your teachers, school nurse, school counselor, coach, Product/process development scientist, or work Freight forwarder about your concussion. Tell them about what you can or cannot do. They should watch to see if:  It gets even harder for you to pay attention or concentrate.  It gets even harder for you to remember things or learn new things.  You need more time than normal to finish things.  You become annoyed (irritable) more than before.  You are not able to deal with stress as well.  You have more problems than before.  Rest. Make sure you:  Get plenty of sleep at night.  Go to sleep early.  Go to bed at the same time every day. Try to wake up at the same time.  Rest during the day.  Take naps when you feel tired.  Limit activities where you have to think a lot or concentrate. These include:  Doing homework.  Doing work related to a job.  Watching TV.  Using the computer. Returning To Your Regular Activities Return to your normal activities slowly, not all at once. You must give your body and brain enough time to heal.   Do not play sports or do other athletic activities until your doctor says it is okay.  Ask your doctor when you can drive, ride a bicycle, or work other vehicles or machines. Never do these things if you feel  dizzy.  Ask your doctor about when you can return to work or school. Preventing Another Concussion It is very important to avoid another brain injury, especially before you have healed. In rare cases, another injury can lead to permanent brain damage, brain swelling, or death. The risk of this is greatest during the first 7-10 days after your injury. Avoid injuries by:   Wearing a seat belt when riding in a car.  Not drinking too much alcohol.  Avoiding activities that could lead to a second concussion (such as contact sports).  Wearing a helmet when doing activities  like:  Biking.  Skiing.  Skateboarding.  Skating.  Making your home safer by:  Removing things from the floor or stairways that could make you trip.  Using grab bars in bathrooms and handrails by stairs.  Placing non-slip mats on floors and in bathtubs.  Improve lighting in dark areas. GET HELP IF:  It gets even harder for you to pay attention or concentrate.  It gets even harder for you to remember things or learn new things.  You need more time than normal to finish things.  You become annoyed (irritable) more than before.  You are not able to deal with stress as well.  You have more problems than before.  You have problems keeping your balance.  You are not able to react quickly when you should. Get help if you have any of these problems for more than 2 weeks:   Lasting (chronic) headaches.  Dizziness or trouble balancing.  Feeling sick to your stomach (nausea).  Seeing (vision) problems.  Being affected by noises or light more than normal.  Feeling sad, low, down in the dumps, blue, gloomy, or empty (depressed).  Mood changes (mood swings).  Feeling of fear or nervousness about what may happen (anxiety).  Feeling annoyed.  Memory problems.  Problems concentrating or paying attention.  Sleep problems.  Feeling tired all the time. GET HELP RIGHT AWAY IF:   You have bad headaches or your headaches get worse.  You have weakness (even if it is in one hand, leg, or part of the face).  You have loss of feeling (numbness).  You feel off balance.  You keep throwing up (vomiting).  You feel tired.  One black center of your eye (pupil) is larger than the other.  You twitch or shake violently (convulse).  Your speech is not clear (slurred).  You are more confused, easily angered (agitated), or annoyed than before.  You have more trouble resting than before.  You are unable to recognize people or places.  You have neck pain.  It is  difficult to wake you up.  You have unusual behavior changes.  You pass out (lose consciousness). MAKE SURE YOU:   Understand these instructions.  Will watch your condition.  Will get help right away if you are not doing well or get worse. Document Released: 02/12/2009 Document Revised: 03/01/2013 Document Reviewed: 09/16/2012 [facility] Patient Information 2015 [place], [place]. This information is not intended to replace advice given to you by your health care provider. Make sure you discuss any questions you have with your health care provider.

## 2013-09-13 ENCOUNTER — Ambulatory Visit (HOSPITAL_COMMUNITY)
Admission: RE | Admit: 2013-09-13 | Discharge: 2013-09-13 | Disposition: A | Discharge status: Home / Self Care | Payer: Medicare Other | Source: Ambulatory Visit

## 2013-09-13 DIAGNOSIS — R29898 Other symptoms and signs involving the musculoskeletal system: Secondary | ICD-10-CM

## 2013-09-13 DIAGNOSIS — IMO0001 Reserved for inherently not codable concepts without codable children: Secondary | ICD-10-CM | POA: Diagnosis not present

## 2013-09-13 DIAGNOSIS — M25572 Pain in left ankle and joints of left foot: Secondary | ICD-10-CM

## 2013-09-13 NOTE — Evaluation (Signed)
Physical Therapy Evaluation  Patient Details  Name: Joseph Pruitt MRN: [id number] Date of Birth: 1966/02/12  Today's Date: 09/13/2013 Time: 0800-0845 PT Time Calculation (min): 45 min    Charges: TherEx [phone]          Visit#: 9 of 16  Re-eval: 09/10/13 Assessment Diagnosis: Lt achilles surgery Surgical Date: 11/05/12 Next MD Visit: Dr.Ostrum - unsure;  Pheller 7/10 for headaches  Authorization: Medicare    Authorization Time Period: new orders and re assessment for ongoing care 04/21/2013  , had completed 13 visits prior with last treatment date on 06/10/2013   Authorization Visit#: 9 of 16   Past Medical History:  Past Medical History  Diagnosis Date  . Stomach ulcer    Past Surgical History:  Past Surgical History  Procedure Laterality Date  . Spine surgery    . Hand surgery    . Achilles tendon surgery      Subjective Symptoms/Limitations Symptoms: Pt reports minor pain in ankle, headaches continue to kill him, reports medication does not help.   How long can you sit comfortably?: stiffens up with prolonged sitting/standing How long can you walk comfortably?: 50minutes Patient Stated Goals: walk longer distances , less pain  Pain Assessment Currently in Pain?: Yes Pain Score: 1  Pain Location: Ankle Pain Orientation: Left;Posterior Pain Type: Chronic pain;Surgical pain Pain Onset: More than a month ago Pain Frequency: Constant Pain Relieving Factors: pain medicaion 2x/day and ice, gentle ankle AROM, massage Multiple Pain Sites: Yes  Precautions/Restrictions     Balance Screening    Prior Functioning     Cognition/Observation    Sensation/Coordination/Flexibility/Functional Tests Functional Tests Functional Tests: Calcaneal inversion: 24 degrees, 7 eversion.   Assessment LLE AROM (degrees) Left Ankle Dorsiflexion: 20 Left Ankle Plantar Flexion: 45 Left Ankle Inversion: 42 Left Ankle Eversion: 20 LLE Strength Left Ankle Dorsiflexion: 5/5 Left  Ankle Plantar Flexion: 3+/5 Left Ankle Inversion: 5/5 Left Ankle Eversion: 5/5  Exercise/Treatments Ankle Stretches Soleus Stretch: 3 reps;30 seconds;Limitations Soleus Stretch Limitations: slant board Other Stretch: 3D ankle excursion,  Other Stretch: 3D ankle excursion at wall. 10x Ankle Exercises - Standing Rocker Board: 3 minutes Rocker Board Limitations: R/L A/P no HHA Heel Raises:  (slow descent) Heel Raises Limitations: single leg 11 reps with bilateral UE support, bilateral  3way with transverse plane manipulated 10x each (incdicating 3+/5 MMT) Other Standing Ankle Exercises: 3D ankle excursion 10x Other Standing Ankle Exercises: anterior and lateral lunges to 18" inch and floor 10x each  Physical Therapy Assessment and Plan PT Assessment and Plan Clinical Impression Statement: Patient has made good progress towards all goals with achievement of full ankle AROM. Patient's is primarily limited by pain with Lt single leg plantar flexion due to pain. Patient is able to perform dynamic lunges and standing single leg 3D ankle excursions and bilateral heel raises. Therapy this session focused on improving plantar flexion strength through pain free activities. Patient will continue to benefit from skilled physical therapy to achieve long term goals.   PT Plan: Focus of phsycial therapy to now shift to maintaining full anke AROM and progress ankle plantar flexion strength and ankle stability in weight bearing. Next session introduce splistance bilateral heel raises in addition to heel raises with transverse plane manipulated.     Goals Home Exercise Program PT Goal: Perform Home Exercise Program - Progress: Goal set today PT Short Term Goals Time to Complete Short Term Goals: 4 weeks PT Short Term Goal 1: Pt will report present with minimal fascial  restriction and muscle spasms to incision and gastroc region to report pain less than 2/10 when walking.  PT Short Term Goal 1 - Progress:  Met PT Short Term Goal 2: Pt will improve his ankle plantarlfexion to 40 degress to prepare forimproved stride length during giat  PT Short Term Goal 2 - Progress: Met PT Short Term Goal 3: Pt will improve his Lt ankle strength to 3/5 muscle grade to prepare for independent walking in normal shoes.  PT Short Term Goal 3 - Progress: Met PT Short Term Goal 4: patient to report intermitent rather than constant pain in sitting  PT Short Term Goal 4 - Progress: Met PT Long Term Goals PT Long Term Goal 1: Patient will improve ankle dorsiflexion ROM to 20 degrees to demosntrate increased achilles/gastroc mobility to increase stride length. PT Long Term Goal 1 - Progress: Met PT Long Term Goal 2: Pt will improve his Lt ankle strength to 4/5 muscle grade to perform walking/and standing on tos to reach to high shelf PT Long Term Goal 2 - Progress: Progressing toward goal Long Term Goal 3: Pt will improve Lt ankle AROM to [facility] in order to ascend and descend steps with no difficulty  Long Term Goal 3 Progress: Met Long Term Goal 4: patient to ambulate 30 min with no pain for community related activities  Long Term Goal 4 Progress: Progressing toward goal PT Long Term Goal 5: Pt will improve his Lt ankle strength to 5/5 muscle grade to prepare return to sport/basketball Long Term Goal 5 Progress: Progressing toward goal  Problem List Patient Active Problem List   Diagnosis Date Noted  . Pain in joint, ankle and foot 01/20/2013  . Left leg weakness 01/20/2013  . CARPAL TUNNEL SYNDROME, HX OF 02/14/2009  . MONONEURITIS OF UNSPECIFIED SITE 11/22/2008  . 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 TENDINITIS, RIGHT WRIST 11/22/2008  . NECK PAIN 08/14/2008    PT - End of Session Activity Tolerance: Patient tolerated treatment well General Behavior During Therapy: WFL for tasks assessed/performed  GP Functional Assessment Tool Used: foto was STATUS 51% LIMITATION 49% Mobility: Walking  and Moving Around Current Status ([id number]): At least 40 percent but less than 60 percent impaired, limited or restricted Mobility: Walking and Moving Around Goal Status [phone]): At least 20 percent but less than 40 percent impaired, limited or restricted  Leia Alf 09/13/2013, 2:17 PM  Physician Documentation Your signature is required to indicate approval of the treatment plan as stated above.  Please sign and either send electronically or make a copy of this report for your files and return this physician signed original.   Please mark one 1.__approve of plan  2. ___approve of plan with the following conditions.   ______________________________                                                          _____________________ Physician Signature  Date  

## 2013-09-16 ENCOUNTER — Inpatient Hospital Stay (HOSPITAL_COMMUNITY): Admission: RE | Admit: 2013-09-16 | Payer: Medicare Other | Source: Ambulatory Visit | Admitting: Physical Therapy

## 2013-09-19 ENCOUNTER — Ambulatory Visit (HOSPITAL_COMMUNITY)
Admission: RE | Admit: 2013-09-19 | Discharge: 2013-09-19 | Disposition: A | Discharge status: Home / Self Care | Payer: Medicare Other | Source: Ambulatory Visit | Attending: Orthopedic Surgery | Admitting: Orthopedic Surgery

## 2013-09-19 DIAGNOSIS — IMO0001 Reserved for inherently not codable concepts without codable children: Secondary | ICD-10-CM | POA: Diagnosis not present
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
## 2013-09-21 ENCOUNTER — Ambulatory Visit (HOSPITAL_COMMUNITY)
Admission: RE | Admit: 2013-09-21 | Discharge: 2013-09-21 | Disposition: A | Discharge status: Home / Self Care | Payer: Medicare Other | Source: Ambulatory Visit | Attending: *Deleted | Admitting: *Deleted

## 2013-09-21 DIAGNOSIS — R51 Headache: Secondary | ICD-10-CM

## 2013-09-21 DIAGNOSIS — R519 Headache, unspecified: Secondary | ICD-10-CM | POA: Insufficient documentation

## 2013-09-21 DIAGNOSIS — IMO0001 Reserved for inherently not codable concepts without codable children: Secondary | ICD-10-CM | POA: Diagnosis not present

## 2013-09-21 DIAGNOSIS — G44229 Chronic tension-type headache, not intractable: Secondary | ICD-10-CM
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
## 2013-09-26 ENCOUNTER — Ambulatory Visit (HOSPITAL_COMMUNITY)
Admission: RE | Admit: 2013-09-26 | Discharge: 2013-09-26 | Disposition: A | Discharge status: Home / Self Care | Payer: Medicare Other | Source: Ambulatory Visit | Attending: Orthopedic Surgery | Admitting: Orthopedic Surgery

## 2013-09-26 DIAGNOSIS — IMO0001 Reserved for inherently not codable concepts without codable children: Secondary | ICD-10-CM | POA: Diagnosis not present
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
## 2013-09-28 ENCOUNTER — Ambulatory Visit (HOSPITAL_COMMUNITY)
Admission: RE | Admit: 2013-09-28 | Discharge: 2013-09-28 | Disposition: A | Discharge status: Home / Self Care | Payer: Medicare Other | Source: Ambulatory Visit

## 2013-09-28 DIAGNOSIS — IMO0001 Reserved for inherently not codable concepts without codable children: Secondary | ICD-10-CM | POA: Diagnosis not present
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
## 2013-10-06 ENCOUNTER — Ambulatory Visit (HOSPITAL_COMMUNITY)
Admission: RE | Admit: 2013-10-06 | Discharge: 2013-10-06 | Disposition: A | Discharge status: Home / Self Care | Payer: Medicare Other | Source: Ambulatory Visit | Attending: Orthopedic Surgery | Admitting: Orthopedic Surgery

## 2013-10-06 DIAGNOSIS — IMO0001 Reserved for inherently not codable concepts without codable children: Secondary | ICD-10-CM | POA: Diagnosis not present

## 2013-10-06 NOTE — Progress Notes (Signed)
Physical Therapy Treatment Patient Details  Name: Joseph Pruitt MRN: [id number] Date of Birth: 06/22/1965  Today's Date: 10/06/2013 Time: 0800-0835 PT Time Calculation (min): 35 min Charge: TE 0800-0820, Manual [id number]  Visit#: 14 of 19  Re-eval: 10/14/13 (Re-eval ankkle)    Authorization: Medicare, G code updated visit 9  Authorization Time Period: new orders and re assessment for ongoing care 04/21/2013  , had completed 13 visits prior with last treatment date on 06/10/2013   Authorization Visit#: 14 of 19   Subjective: Symptoms/Limitations Symptoms: Pt stated headaches are reducing, current neck pain scale 6/10 posterior neck. Pain Assessment Currently in Pain?: Yes Pain Score: 6  Pain Location: Neck Pain Orientation: Posterior  Objective:   Exercise/Treatments Stretches Upper Trapezius Stretch: 3 reps;30 seconds Levator Stretch: 2 reps;30 seconds Corner Stretch: 3 reps;30 seconds;Limitations Warehouse manager Limitations: 3directions Other Neck Stretches: anterior shoulder door way stretch 10seconds 5x Other Neck Stretches: posterior shoulder stretch 10seconds 5x Standing Exercises Other Standing Exercises: 3D neck excurision inside // bars to reduce compensation  Manual Therapy Manual Therapy: Massage Massage: STM to upper trapezius, levator scapula, scalenes, splenius capitis, pectoralis major, suboccipital release and genle manual cervical traction  Physical Therapy Assessment and Plan PT Assessment and Plan Clinical Impression Statement: Session focus on improving cervical mobiltiy with 3D exercises and stretches, pt able to following demonstration with min cueing for form.  Manual techniques complete to reduce fascial restriciotns and spasms.  Pt stated pain reduced to 3/10 at end of session.   PT Plan: Therapy to focus on improving cervical spine mobility in addition to Focus of phsycial therapy to maintaining full anke AROM and progress ankle plantar flexion  strength and ankle stability in weight bearing. Ankle strengtheing to focus on increasing medial ankle stability to decrease strain on lateral ankle, as lateral ankle pain improves progress to lateral ankle strengthening    Goals PT Short Term Goals PT Short Term Goal 1: Patient will report decreasing head aches to <3x per week with improved ability to sleep through night waking <2x on average per night PT Short Term Goal 2: Patient will displays improved cervical spine flexion to 60degrees to be able to look down while walking PT Short Term Goal 3: Patient will display improved cervical spine extnsion of 40 degree to to be able to look up a cieling for work activities.  PT Short Term Goal 4: patient will be able to rotaate head bilaterally to 80degrees indicatign full upper trapezius and levator mobility  PT Short Term Goal 5: Patient will be able to perform deep neck flexor endurance test for 60 seconds withtou pain indicatign return to prior level of function  Problem List Patient Active Problem List   Diagnosis Date Noted  . Head ache 09/21/2013  . Pain in joint, ankle and foot 01/20/2013  . Left leg weakness 01/20/2013  . CARPAL TUNNEL SYNDROME, HX OF 02/14/2009  . MONONEURITIS OF UNSPECIFIED SITE 11/22/2008  . 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 TENDINITIS, RIGHT WRIST 11/22/2008  . NECK PAIN 08/14/2008    PT - End of Session Activity Tolerance: Patient tolerated treatment well General Behavior During Therapy: [facility] for tasks assessed/performed  GP    [name] 10/06/2013, 8:46 AM

## 2013-10-11 ENCOUNTER — Ambulatory Visit (HOSPITAL_COMMUNITY): Payer: Medicare Other

## 2013-10-12 ENCOUNTER — Ambulatory Visit (HOSPITAL_COMMUNITY)
Admission: RE | Admit: 2013-10-12 | Payer: Medicare Other | Source: Ambulatory Visit | Attending: Orthopedic Surgery | Admitting: Orthopedic Surgery

## 2013-10-18 ENCOUNTER — Ambulatory Visit (HOSPITAL_COMMUNITY)
Admission: RE | Admit: 2013-10-18 | Discharge: 2013-10-18 | Disposition: A | Discharge status: Home / Self Care | Payer: Medicare Other | Source: Ambulatory Visit | Attending: Orthopedic Surgery | Admitting: Orthopedic Surgery

## 2013-10-18 DIAGNOSIS — M25473 Effusion, unspecified ankle: Secondary | ICD-10-CM | POA: Insufficient documentation

## 2013-10-18 DIAGNOSIS — M6281 Muscle weakness (generalized): Secondary | ICD-10-CM | POA: Insufficient documentation

## 2013-10-18 DIAGNOSIS — M25579 Pain in unspecified ankle and joints of unspecified foot: Secondary | ICD-10-CM | POA: Diagnosis not present

## 2013-10-18 DIAGNOSIS — IMO0001 Reserved for inherently not codable concepts without codable children: Secondary | ICD-10-CM | POA: Insufficient documentation

## 2013-10-18 DIAGNOSIS — R262 Difficulty in walking, not elsewhere classified: Secondary | ICD-10-CM | POA: Diagnosis not present

## 2013-10-18 DIAGNOSIS — M25476 Effusion, unspecified foot: Secondary | ICD-10-CM | POA: Insufficient documentation

## 2013-10-18 NOTE — Progress Notes (Signed)
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  Pt reports his pain remains at 6/10 mostly in upper cervical region bilaterally. Pain Assessment Currently in Pain?: Yes Pain Score: 6  Pain Location: Neck   Exercise/Treatments Stretches Corner Stretch: 3 reps;30 seconds;Limitations Other Neck Stretches: anterior shoulder door way stretch 10seconds 5x Standing Exercises Other Standing Exercises: 3D neck excurision inside // bars to reduce compensation Seated Exercises UBE backward, level 3    Manual Therapy Manual Therapy: Massage Massage: STM to upper trapezius, levator scapula, scalenes, splenius capitis, pectoralis major  Physical Therapy Assessment and Plan PT Assessment and Plan Clinical Impression Statement: Focused session on cervical pain and discomfort.  Advised patient to take rest breaks and watch positioning of cervical spine with upcoming activities (playing drums, painting and bowling).  Continued with manual techniques to reduce fascial restrictions and spasms.  Pt reported overall reduction of cervical pain by 3 at end of session.  Patient due for re-evaluation next visit.  PT Plan: Re-evaluate next visit.      Problem List Patient Active Problem List   Diagnosis Date Noted  . Head ache 09/21/2013  . Pain in joint, ankle and foot 01/20/2013  . Left leg weakness 01/20/2013  . CARPAL TUNNEL SYNDROME, HX OF 02/14/2009  . MONONEURITIS OF UNSPECIFIED SITE 11/22/2008  . SPONDYLOSIS,  CERVICAL, WITH RADICULOPATHY 11/22/2008  . TENDINITIS, RIGHT WRIST 11/22/2008  . NECK PAIN 08/14/2008    PT - End of Session Activity Tolerance: Patient tolerated treatment well General Behavior During Therapy: WFL for tasks assessed/performed   Teena Irani, PTA/CLT 10/18/2013, 9:35 AM

## 2013-10-20 ENCOUNTER — Ambulatory Visit (HOSPITAL_COMMUNITY)
Admission: RE | Admit: 2013-10-20 | Discharge: 2013-10-20 | Disposition: A | Discharge status: Home / Self Care | Payer: Medicare Other | Source: Ambulatory Visit | Attending: Orthopedic Surgery | Admitting: Orthopedic Surgery

## 2013-10-20 DIAGNOSIS — IMO0001 Reserved for inherently not codable concepts without codable children: Secondary | ICD-10-CM | POA: Diagnosis not present
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
## 2013-10-24 ENCOUNTER — Ambulatory Visit (HOSPITAL_COMMUNITY)
Admission: RE | Admit: 2013-10-24 | Payer: Medicare Other | Source: Ambulatory Visit | Attending: Orthopedic Surgery | Admitting: Orthopedic Surgery

## 2013-10-26 ENCOUNTER — Inpatient Hospital Stay (HOSPITAL_COMMUNITY): Admission: RE | Admit: 2013-10-26 | Payer: Medicare Other | Source: Ambulatory Visit | Admitting: Physical Therapy

## 2013-11-09 ENCOUNTER — Inpatient Hospital Stay (HOSPITAL_COMMUNITY): Admission: RE | Admit: 2013-11-09 | Payer: Medicare Other | Source: Ambulatory Visit | Admitting: Physical Therapy

## 2013-11-09 NOTE — Addendum Note (Signed)
Encounter addended by: Leia Alf, PT on: 11/09/2013 11:01 AM<BR>     Documentation filed: Clinical Notes

## 2013-11-09 NOTE — Addendum Note (Signed)
Encounter addended by: Leia Alf, PT on: 11/09/2013 10:59 AM<BR>     Documentation filed: Clinical Notes

## 2013-11-15 ENCOUNTER — Ambulatory Visit (HOSPITAL_COMMUNITY): Payer: Medicare Other | Admitting: Physical Therapy

## 2013-11-16 ENCOUNTER — Ambulatory Visit (HOSPITAL_COMMUNITY)
Admission: RE | Admit: 2013-11-16 | Discharge: 2013-11-16 | Disposition: A | Discharge status: Home / Self Care | Payer: Medicare Other | Source: Ambulatory Visit | Attending: Orthopedic Surgery | Admitting: Orthopedic Surgery

## 2013-11-16 DIAGNOSIS — IMO0001 Reserved for inherently not codable concepts without codable children: Secondary | ICD-10-CM | POA: Insufficient documentation

## 2013-11-16 DIAGNOSIS — R29898 Other symptoms and signs involving the musculoskeletal system: Secondary | ICD-10-CM

## 2013-11-16 DIAGNOSIS — M25579 Pain in unspecified ankle and joints of unspecified foot: Secondary | ICD-10-CM | POA: Diagnosis not present

## 2013-11-16 DIAGNOSIS — M25473 Effusion, unspecified ankle: Secondary | ICD-10-CM | POA: Diagnosis not present

## 2013-11-16 DIAGNOSIS — R262 Difficulty in walking, not elsewhere classified: Secondary | ICD-10-CM | POA: Insufficient documentation

## 2013-11-16 DIAGNOSIS — M25476 Effusion, unspecified foot: Secondary | ICD-10-CM | POA: Diagnosis not present

## 2013-11-16 DIAGNOSIS — M6281 Muscle weakness (generalized): Secondary | ICD-10-CM | POA: Diagnosis not present

## 2013-11-16 DIAGNOSIS — M25572 Pain in left ankle and joints of left foot: Secondary | ICD-10-CM
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
## 2013-11-17 ENCOUNTER — Ambulatory Visit (HOSPITAL_COMMUNITY)
Admission: RE | Admit: 2013-11-17 | Discharge: 2013-11-17 | Disposition: A | Discharge status: Home / Self Care | Payer: Medicare Other | Source: Ambulatory Visit | Attending: Orthopedic Surgery | Admitting: Orthopedic Surgery

## 2013-11-17 DIAGNOSIS — R29898 Other symptoms and signs involving the musculoskeletal system: Secondary | ICD-10-CM

## 2013-11-17 DIAGNOSIS — IMO0001 Reserved for inherently not codable concepts without codable children: Secondary | ICD-10-CM | POA: Diagnosis not present

## 2013-11-17 DIAGNOSIS — M25572 Pain in left ankle and joints of left foot: Secondary | ICD-10-CM
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
## 2013-11-21 ENCOUNTER — Ambulatory Visit (HOSPITAL_COMMUNITY): Payer: Medicare Other | Admitting: Physical Therapy

## 2013-11-23 ENCOUNTER — Ambulatory Visit (HOSPITAL_COMMUNITY)
Admission: RE | Admit: 2013-11-23 | Discharge: 2013-11-23 | Disposition: A | Discharge status: Home / Self Care | Payer: Medicare Other | Source: Ambulatory Visit | Attending: Orthopedic Surgery | Admitting: Orthopedic Surgery

## 2013-11-23 DIAGNOSIS — IMO0001 Reserved for inherently not codable concepts without codable children: Secondary | ICD-10-CM | POA: Diagnosis not present

## 2013-11-23 DIAGNOSIS — M25572 Pain in left ankle and joints of left foot: Secondary | ICD-10-CM

## 2013-11-23 DIAGNOSIS — R29898 Other symptoms and signs involving the musculoskeletal system: Secondary | ICD-10-CM
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
## 2013-11-28 ENCOUNTER — Ambulatory Visit (HOSPITAL_COMMUNITY)
Admission: RE | Admit: 2013-11-28 | Discharge: 2013-11-28 | Disposition: A | Discharge status: Home / Self Care | Payer: Medicare Other | Source: Ambulatory Visit | Attending: Orthopedic Surgery | Admitting: Orthopedic Surgery

## 2013-11-28 DIAGNOSIS — M25572 Pain in left ankle and joints of left foot: Secondary | ICD-10-CM

## 2013-11-28 DIAGNOSIS — R29898 Other symptoms and signs involving the musculoskeletal system: Secondary | ICD-10-CM

## 2013-11-28 DIAGNOSIS — IMO0001 Reserved for inherently not codable concepts without codable children: Secondary | ICD-10-CM | POA: Diagnosis not present
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
## 2013-11-30 ENCOUNTER — Ambulatory Visit (HOSPITAL_COMMUNITY): Payer: Medicare Other | Admitting: Physical Therapy

## 2013-12-01 ENCOUNTER — Inpatient Hospital Stay (HOSPITAL_COMMUNITY): Admission: RE | Admit: 2013-12-01 | Payer: Medicare Other | Source: Ambulatory Visit | Admitting: Physical Therapy

## 2013-12-12 ENCOUNTER — Inpatient Hospital Stay (HOSPITAL_COMMUNITY): Admission: RE | Admit: 2013-12-12 | Payer: Medicare Other | Source: Ambulatory Visit | Admitting: Physical Therapy

## 2013-12-14 ENCOUNTER — Ambulatory Visit (HOSPITAL_COMMUNITY): Payer: Medicare Other | Admitting: Physical Therapy

## 2013-12-14 ENCOUNTER — Telehealth (HOSPITAL_COMMUNITY): Payer: Self-pay

## 2013-12-14 NOTE — Telephone Encounter (Signed)
Patient came in thinking his appointment was 8:00 and was told it was 8:45.  He said ok and headed out the door and was asked if he was coming back at 8:45 and he said yes.  Patient never returned.

## 2013-12-21 NOTE — Addendum Note (Signed)
Encounter addended by: Debby Bud, OT on: 12/21/2013  9:18 AM<BR>     Documentation filed: Episodes

## 2013-12-27 ENCOUNTER — Ambulatory Visit (HOSPITAL_COMMUNITY): Payer: Medicare Other | Admitting: Physical Therapy

## 2013-12-30 ENCOUNTER — Ambulatory Visit (HOSPITAL_COMMUNITY)
Admission: RE | Admit: 2013-12-30 | Payer: Medicare Other | Source: Ambulatory Visit | Attending: Orthopedic Surgery | Admitting: Orthopedic Surgery

## 2014-01-03 ENCOUNTER — Ambulatory Visit (HOSPITAL_COMMUNITY): Payer: Medicare Other

## 2014-01-06 ENCOUNTER — Ambulatory Visit (HOSPITAL_COMMUNITY): Payer: Medicare Other | Admitting: Physical Therapy

## 2014-01-10 ENCOUNTER — Ambulatory Visit (HOSPITAL_COMMUNITY): Payer: Medicare Other

## 2014-01-12 ENCOUNTER — Ambulatory Visit (HOSPITAL_COMMUNITY): Payer: Medicare Other

## 2014-02-07 ENCOUNTER — Encounter (HOSPITAL_COMMUNITY): Payer: Self-pay | Admitting: Physical Therapy

## 2014-02-07 NOTE — Therapy (Signed)
  Patient Details  Name: Joseph Pruitt MRN: [id number] Date of Birth: [birth year]  Encounter Date: 02/07/2014  PHYSICAL THERAPY DISCHARGE SUMMARY  Visits from Start of Care: 20  Current functional level related to goals / functional outcomes: Unknown Patient has cancelled multiple visits and not returned for several months despite continually rescheduling visits   Remaining deficits: waking <2x on average per night PT Short Term Goal 1 - Progress: not met PT Short Term Goal 2: Patient will displays improved cervical spine flexion to 60degrees to be able to look down while walking  Not met PT Short Term Goal 3: Patient will display improved cervical spine extnsion of 40 degree to to be able to look up a cieling for work activities.   Not Met PT Short Term Goal 3 - Progress: Progressing toward goal  Not Met' PT Short Term Goal 4: patient will be able to rotaate head bilaterally to 80degrees indicatign full upper trapezius and levator mobility   Not Met PT Short Term Goal 5: Patient will be able to perform deep neck flexor endurance test for 60 seconds withtou pain indicatign return to prior level of function PT Long Term Goals PT Long Term Goal 1: Patient will improve ankle dorsiflexion ROM to 20 degrees to demosntrate increased achilles/gastroc mobility to increase stride length.  Not Met PT Long Term Goal 2: Pt will improve his Lt ankle strength to 4/5 muscle grade to perform walking/and standing on tos to reach to high shelf  Not Met Long Term Goal 3: Pt will improve Lt ankle AROM to [facility] in order to ascend and descend steps with no difficulty   Not met Long Term Goal 4: patient to ambulate 30 min with no pain for community related activities   Not Met PT Long Term Goal 5: Pt will improve his Lt ankle strength to 5/5 muscle grade to prepare return to sport/basketball  Not Met   Education / Equipment: HEP given  Plan: Patient agrees to discharge.  Patient goals were not met.  Patient is being discharged due to not returning since the last visit.  ?????      PT Short Term Goal 1: Patient will report decreasing head aches to <3x per week with improved ability to sleep through night  Devona Konig PT DPT [phone]

## 2014-05-08 ENCOUNTER — Encounter (HOSPITAL_COMMUNITY): Payer: Self-pay | Admitting: Emergency Medicine

## 2014-05-08 ENCOUNTER — Emergency Department (HOSPITAL_COMMUNITY)
Admission: EM | Admit: 2014-05-08 | Discharge: 2014-05-08 | Disposition: A | Discharge status: Home / Self Care | Payer: Medicare Other | Attending: Emergency Medicine | Admitting: Emergency Medicine

## 2014-05-08 DIAGNOSIS — R51 Headache: Secondary | ICD-10-CM | POA: Diagnosis present

## 2014-05-08 DIAGNOSIS — R0981 Nasal congestion: Secondary | ICD-10-CM | POA: Diagnosis not present

## 2014-05-08 DIAGNOSIS — Z8719 Personal history of other diseases of the digestive system: Secondary | ICD-10-CM | POA: Insufficient documentation

## 2014-05-08 DIAGNOSIS — R42 Dizziness and giddiness: Secondary | ICD-10-CM | POA: Insufficient documentation

## 2014-05-08 DIAGNOSIS — Z79899 Other long term (current) drug therapy: Secondary | ICD-10-CM | POA: Diagnosis not present

## 2014-05-08 DIAGNOSIS — R519 Headache, unspecified: Secondary | ICD-10-CM

## 2014-05-08 DIAGNOSIS — Z791 Long term (current) use of non-steroidal anti-inflammatories (NSAID): Secondary | ICD-10-CM | POA: Diagnosis not present

## 2014-05-08 MED ORDER — LORATADINE-PSEUDOEPHEDRINE ER 5-120 MG PO TB12: 1.0000 | ORAL_TABLET | Freq: Two times a day (BID) | ORAL | Status: DC

## 2014-05-08 MED ORDER — HYDROCODONE-ACETAMINOPHEN 7.5-325 MG PO TABS: 1.0000 | ORAL_TABLET | ORAL | Status: DC | PRN

## 2014-05-08 NOTE — Discharge Instructions (Signed)
Please increase fluids. Use claritin D every 12 hours for congestion. Use Tylenol or ibuprofen for mild pain. Use norco for more severe discomfort. Please see your MD at [facility] for recheck and management. Headaches, Frequently Asked Questions MIGRAINE HEADACHES Q: What is migraine? What causes it? How can I treat it? A: Generally, migraine headaches begin as a dull ache. Then they develop into a constant, throbbing, and pulsating pain. You may experience pain at the temples. You may experience pain at the front or back of one or both sides of the head. The pain is usually accompanied by a combination of:  Nausea.  Vomiting.  Sensitivity to light and noise. Some people (about 15%) experience an aura (see below) before an attack. The cause of migraine is believed to be chemical reactions in the brain. Treatment for migraine may include over-the-counter or prescription medications. It may also include self-help techniques. These include relaxation training and biofeedback.  Q: What is an aura? A: About 15% of people with migraine get an "aura". This is a sign of neurological symptoms that occur before a migraine headache. You may see wavy or jagged lines, dots, or flashing lights. You might experience tunnel vision or blind spots in one or both eyes. The aura can include visual or auditory hallucinations (something imagined). It may include disruptions in smell (such as strange odors), taste or touch. Other symptoms include:  Numbness.  A "pins and needles" sensation.  Difficulty in recalling or speaking the correct word. These neurological events may last as long as 60 minutes. These symptoms will fade as the headache begins. Q: What is a trigger? A: Certain physical or environmental factors can lead to or "trigger" a migraine. These include:  Foods.  Hormonal changes.  Weather.  Stress. It is important to remember that triggers are different for everyone. To help prevent  migraine attacks, you need to figure out which triggers affect you. Keep a headache diary. This is a good way to track triggers. The diary will help you talk to your healthcare professional about your condition. Q: Does weather affect migraines? A: Bright sunshine, hot, humid conditions, and drastic changes in barometric pressure may lead to, or "trigger," a migraine attack in some people. But studies have shown that weather does not act as a trigger for everyone with migraines. Q: What is the link between migraine and hormones? A: Hormones start and regulate many of your body's functions. Hormones keep your body in balance within a constantly changing environment. The levels of hormones in your body are unbalanced at times. Examples are during menstruation, pregnancy, or menopause. That can lead to a migraine attack. In fact, about three quarters of all women with migraine report that their attacks are related to the menstrual cycle.  Q: Is there an increased risk of stroke for migraine sufferers? A: The likelihood of a migraine attack causing a stroke is very remote. That is not to say that migraine sufferers cannot have a stroke associated with their migraines. In persons under age 41, the most common associated factor for stroke is migraine headache. But over the course of a person's normal life span, the occurrence of migraine headache may actually be associated with a reduced risk of dying from cerebrovascular disease due to stroke.  Q: What are acute medications for migraine? A: Acute medications are used to treat the pain of the headache after it has started. Examples over-the-counter medications, NSAIDs, ergots, and triptans.  Q: What are the triptans? A:  Triptans are the newest class of abortive medications. They are specifically targeted to treat migraine. Triptans are vasoconstrictors. They moderate some chemical reactions in the brain. The triptans work on receptors in your brain. Triptans  help to restore the balance of a neurotransmitter called serotonin. Fluctuations in levels of serotonin are thought to be a main cause of migraine.  Q: Are over-the-counter medications for migraine effective? A: Over-the-counter, or "OTC," medications may be effective in relieving mild to moderate pain and associated symptoms of migraine. But you should see your caregiver before beginning any treatment regimen for migraine.  Q: What are preventive medications for migraine? A: Preventive medications for migraine are sometimes referred to as "prophylactic" treatments. They are used to reduce the frequency, severity, and length of migraine attacks. Examples of preventive medications include antiepileptic medications, antidepressants, beta-blockers, calcium channel blockers, and NSAIDs (nonsteroidal anti-inflammatory drugs). Q: Why are anticonvulsants used to treat migraine? A: During the past few years, there has been an increased interest in antiepileptic drugs for the prevention of migraine. They are sometimes referred to as "anticonvulsants". Both epilepsy and migraine may be caused by similar reactions in the brain.  Q: Why are antidepressants used to treat migraine? A: Antidepressants are typically used to treat people with depression. They may reduce migraine frequency by regulating chemical levels, such as serotonin, in the brain.  Q: What alternative therapies are used to treat migraine? A: The term "alternative therapies" is often used to describe treatments considered outside the scope of conventional Western medicine. Examples of alternative therapy include acupuncture, acupressure, and yoga. Another common alternative treatment is herbal therapy. Some herbs are believed to relieve headache pain. Always discuss alternative therapies with your caregiver before proceeding. Some herbal products contain arsenic and other toxins. TENSION HEADACHES Q: What is a tension-type headache? What causes it?  How can I treat it? A: Tension-type headaches occur randomly. They are often the result of temporary stress, anxiety, fatigue, or anger. Symptoms include soreness in your temples, a tightening band-like sensation around your head (a "vice-like" ache). Symptoms can also include a pulling feeling, pressure sensations, and contracting head and neck muscles. The headache begins in your forehead, temples, or the back of your head and neck. Treatment for tension-type headache may include over-the-counter or prescription medications. Treatment may also include self-help techniques such as relaxation training and biofeedback. CLUSTER HEADACHES Q: What is a cluster headache? What causes it? How can I treat it? A: Cluster headache gets its name because the attacks come in groups. The pain arrives with little, if any, warning. It is usually on one side of the head. A tearing or bloodshot eye and a runny nose on the same side of the headache may also accompany the pain. Cluster headaches are believed to be caused by chemical reactions in the brain. They have been described as the most severe and intense of any headache type. Treatment for cluster headache includes prescription medication and oxygen. SINUS HEADACHES Q: What is a sinus headache? What causes it? How can I treat it? A: When a cavity in the bones of the face and skull (a sinus) becomes inflamed, the inflammation will cause localized pain. This condition is usually the result of an allergic reaction, a tumor, or an infection. If your headache is caused by a sinus blockage, such as an infection, you will probably have a fever. An x-ray will confirm a sinus blockage. Your caregiver's treatment might include antibiotics for the infection, as well as antihistamines or decongestants.  REBOUND HEADACHES Q: What is a rebound headache? What causes it? How can I treat it? A: A pattern of taking acute headache medications too often can lead to a condition known as  "rebound headache." A pattern of taking too much headache medication includes taking it more than 2 days per week or in excessive amounts. That means more than the label or a caregiver advises. With rebound headaches, your medications not only stop relieving pain, they actually begin to cause headaches. Doctors treat rebound headache by tapering the medication that is being overused. Sometimes your caregiver will gradually substitute a different type of treatment or medication. Stopping may be a challenge. Regularly overusing a medication increases the potential for serious side effects. Consult a caregiver if you regularly use headache medications more than 2 days per week or more than the label advises. ADDITIONAL QUESTIONS AND ANSWERS Q: What is biofeedback? A: Biofeedback is a self-help treatment. Biofeedback uses special equipment to monitor your body's involuntary physical responses. Biofeedback monitors:  Breathing.  Pulse.  Heart rate.  Temperature.  Muscle tension.  Brain activity. Biofeedback helps you refine and perfect your relaxation exercises. You learn to control the physical responses that are related to stress. Once the technique has been mastered, you do not need the equipment any more. Q: Are headaches hereditary? A: Four out of five (80%) of people that suffer report a family history of migraine. Scientists are not sure if this is genetic or a family predisposition. Despite the uncertainty, a child has a 50% chance of having migraine if one parent suffers. The child has a 75% chance if both parents suffer.  Q: Can children get headaches? A: By the time they reach high school, most young people have experienced some type of headache. Many safe and effective approaches or medications can prevent a headache from occurring or stop it after it has begun.  Q: What type of doctor should I see to diagnose and treat my headache? A: Start with your primary caregiver. Discuss his or  her experience and approach to headaches. Discuss methods of classification, diagnosis, and treatment. Your caregiver may decide to recommend you to a headache specialist, depending upon your symptoms or other physical conditions. Having diabetes, allergies, etc., may require a more comprehensive and inclusive approach to your headache. The National Headache Foundation will provide, upon request, a list of [facility] physician members in your state. Document Released: 05/17/2003 Document Revised: 05/19/2011 Document Reviewed: 10/25/2007 [facility] Patient Information 2015 [place], [place]. This information is not intended to replace advice given to you by your health care provider. Make sure you discuss any questions you have with your health care provider.

## 2014-05-08 NOTE — ED Notes (Addendum)
Pt reports headache and dizziness since last night. Pt reports started new bp med x1 month. nad noted.

## 2014-05-08 NOTE — ED Notes (Signed)
Patient with no complaints at this time. Respirations even and unlabored. Skin warm/dry. Discharge instructions reviewed with patient at this time. Patient given opportunity to voice concerns/ask questions. Patient discharged at this time and left Emergency Department with steady gait.   

## 2014-05-08 NOTE — ED Provider Notes (Signed)
CSN: [id number]     Arrival date & time 05/08/14  2130 History   First MD Initiated Contact with Patient 05/08/14 [phone]     Chief Complaint  Patient presents with  . Headache     (Consider location/radiation/quality/duration/timing/severity/associated sxs/prior Treatment) HPI  Past Medical History  Diagnosis Date  . Stomach ulcer    Past Surgical History  Procedure Laterality Date  . Spine surgery    . Hand surgery    . Achilles tendon surgery     History reviewed. No pertinent family history. History  Substance Use Topics  . Smoking status: Never Smoker   . Smokeless tobacco: Not on file  . Alcohol Use: No    Review of Systems  Constitutional: Negative for activity change.       All ROS Neg except as noted in HPI  HENT: Positive for congestion. Negative for nosebleeds.   Eyes: Negative for photophobia and discharge.  Respiratory: Negative for cough, shortness of breath and wheezing.   Cardiovascular: Negative for chest pain and palpitations.  Gastrointestinal: Negative for abdominal pain and blood in stool.  Genitourinary: Negative for dysuria, frequency and hematuria.  Musculoskeletal: Negative for back pain, arthralgias and neck pain.  Skin: Negative.   Neurological: Positive for dizziness and headaches. Negative for seizures and speech difficulty.  Psychiatric/Behavioral: Negative for hallucinations and confusion.      Allergies  Review of patient's allergies indicates no known allergies.  Home Medications   Prior to Admission medications   Medication Sig Start Date End Date Taking? Authorizing Provider  cyclobenzaprine (FLEXERIL) 10 MG tablet Take 1 tablet (10 mg total) by mouth 3 (three) times daily as needed for muscle spasms. 08/23/13   Janice Norrie, MD  diazepam (VALIUM) 5 MG tablet Take 1 tablet (5 mg total) by mouth every 6 (six) hours as needed for anxiety (spasms). 09/07/13   Mariea Clonts, MD  naproxen (NAPROSYN) 500 MG tablet Take 1 tablet (500 mg  total) by mouth 2 (two) times daily. 08/23/13   Janice Norrie, MD  oxyCODONE-acetaminophen (PERCOCET) 5-325 MG per tablet Take 1 tablet by mouth every 6 (six) hours as needed. Pain    Historical Provider, MD   BP 131/98 mmHg  Pulse 68  Temp(Src) 97.9 F (36.6 C) (Oral)  Resp 18  Ht 6\' 1"  (1.854 m)  Wt 235 lb (106.595 kg)  BMI 31.01 kg/m2  SpO2 97% Physical Exam  Constitutional: He is oriented to person, place, and time. He appears well-developed and well-nourished.  Non-toxic appearance.  HENT:  Head: Normocephalic.  Right Ear: Tympanic membrane and external ear normal.  Left Ear: Tympanic membrane and external ear normal.  Eyes: EOM and lids are normal. Pupils are equal, round, and reactive to light.  Neck: Normal range of motion. Neck supple. Carotid bruit is not present.  Cardiovascular: Normal rate, regular rhythm, normal heart sounds, intact distal pulses and normal pulses.   Pulmonary/Chest: Breath sounds normal. No respiratory distress.  Abdominal: Soft. Bowel sounds are normal. There is no tenderness. There is no guarding.  Musculoskeletal: Normal range of motion.  Lymphadenopathy:       Head (right side): No submandibular adenopathy present.       Head (left side): No submandibular adenopathy present.    He has no cervical adenopathy.  Neurological: He is alert and oriented to person, place, and time. He has normal strength. No cranial nerve deficit or sensory deficit. He exhibits normal muscle tone. Coordination normal.  Speech clear Gait  steady.  Skin: Skin is warm and dry.  Psychiatric: He has a normal mood and affect. His speech is normal.  Nursing note and vitals reviewed.   ED Course  Procedures (including critical care time) Labs Review Labs Reviewed - No data to display  Imaging Review No results found.   EKG Interpretation None      MDM  No gross neuro deficit. B/P 131/98, o/w vital signs wnl. Pt has hx of headaches. Some congestion noted. Plan -  claritin D and Norco 7.5 Rx given. Pt to follow up with MD at [facility]. He will return to the ED if any changes or problem.   Final diagnoses:  None    **I have reviewed nursing notes, vital signs, and all appropriate lab and imaging results for this patient.Lenox Ahr, PA-C 05/08/14 [place], MD 05/08/14 [phone]

## 2014-07-20 DIAGNOSIS — Z79899 Other long term (current) drug therapy: Secondary | ICD-10-CM | POA: Insufficient documentation

## 2014-08-06 ENCOUNTER — Emergency Department (HOSPITAL_COMMUNITY)
Admission: EM | Admit: 2014-08-06 | Discharge: 2014-08-06 | Disposition: A | Discharge status: Home / Self Care | Payer: Medicare Other | Attending: Emergency Medicine | Admitting: Emergency Medicine

## 2014-08-06 ENCOUNTER — Emergency Department (HOSPITAL_COMMUNITY): Payer: Medicare Other

## 2014-08-06 ENCOUNTER — Encounter (HOSPITAL_COMMUNITY): Payer: Self-pay | Admitting: Emergency Medicine

## 2014-08-06 DIAGNOSIS — Z791 Long term (current) use of non-steroidal anti-inflammatories (NSAID): Secondary | ICD-10-CM | POA: Diagnosis not present

## 2014-08-06 DIAGNOSIS — I10 Essential (primary) hypertension: Secondary | ICD-10-CM | POA: Insufficient documentation

## 2014-08-06 DIAGNOSIS — Z79899 Other long term (current) drug therapy: Secondary | ICD-10-CM | POA: Diagnosis not present

## 2014-08-06 DIAGNOSIS — J069 Acute upper respiratory infection, unspecified: Secondary | ICD-10-CM | POA: Diagnosis not present

## 2014-08-06 DIAGNOSIS — R0981 Nasal congestion: Secondary | ICD-10-CM | POA: Diagnosis present

## 2014-08-06 DIAGNOSIS — E780 Pure hypercholesterolemia: Secondary | ICD-10-CM | POA: Diagnosis not present

## 2014-08-06 HISTORY — DX: Other allergy status, other than to drugs and biological substances: Z91.09

## 2014-08-06 HISTORY — DX: Essential (primary) hypertension: I10

## 2014-08-06 HISTORY — DX: Pure hypercholesterolemia, unspecified: E78.00

## 2014-08-06 MED ORDER — BENZONATATE 100 MG PO CAPS: 100.0000 mg | ORAL_CAPSULE | Freq: Three times a day (TID) | ORAL | Status: DC | PRN

## 2014-08-06 NOTE — ED Provider Notes (Signed)
CSN: [id number]     Arrival date & time 08/06/14  1114 History   First MD Initiated Contact with Patient 08/06/14 1130     Chief Complaint  Patient presents with  . URI     HPI  Pt was seen at 1130.  Per pt, c/o gradual onset and persistence of constant runny/stuffy nose, sinus congestion, and cough for the past 2 days.  Denies fevers, no rash, no CP/SOB, no N/V/D, no abd pain.    Past Medical History  Diagnosis Date  . Stomach ulcer   . Hypertension   . High cholesterol   . Environmental allergies    Past Surgical History  Procedure Laterality Date  . Spine surgery    . Hand surgery    . Achilles tendon surgery      History  Substance Use Topics  . Smoking status: Never Smoker   . Smokeless tobacco: Never Used  . Alcohol Use: No    Review of Systems ROS: Statement: All systems negative except as marked or noted in the HPI; Constitutional: Negative for fever and chills. ; ; Eyes: Negative for eye pain, redness and discharge. ; ; ENMT: Negative for ear pain, hoarseness, sore throat. +nasal congestion, sinus pressure and rhinorrhea. ; ; Cardiovascular: Negative for chest pain, palpitations, diaphoresis, dyspnea and peripheral edema. ; ; Respiratory: +cough. Negative for wheezing and stridor. ; ; Gastrointestinal: Negative for nausea, vomiting, diarrhea, abdominal pain, blood in stool, hematemesis, jaundice and rectal bleeding. . ; ; Genitourinary: Negative for dysuria, flank pain and hematuria. ; ; Musculoskeletal: Negative for back pain and neck pain. Negative for swelling and trauma.; ; Skin: Negative for pruritus, rash, abrasions, blisters, bruising and skin lesion.; ; Neuro: Negative for headache, lightheadedness and neck stiffness. Negative for weakness, altered level of consciousness , altered mental status, extremity weakness, paresthesias, involuntary movement, seizure and syncope.     Allergies  Review of patient's allergies indicates no known allergies.  Home  Medications   Prior to Admission medications   Medication Sig Start Date End Date Taking? Authorizing Provider  atorvastatin (LIPITOR) 40 MG tablet Take 1 tablet by mouth daily. 04/14/14   Historical Provider, MD  cyclobenzaprine (FLEXERIL) 10 MG tablet Take 1 tablet (10 mg total) by mouth 3 (three) times daily as needed for muscle spasms. Patient not taking: Reported on 05/08/2014 08/23/13   Rolland Porter, MD  diazepam (VALIUM) 5 MG tablet Take 1 tablet (5 mg total) by mouth every 6 (six) hours as needed for anxiety (spasms). Patient not taking: Reported on 05/08/2014 09/07/13   Elnora Morrison, MD  hydrochlorothiazide (HYDRODIURIL) 25 MG tablet Take 1 tablet by mouth daily. 04/14/14   Historical Provider, MD  HYDROcodone-acetaminophen (NORCO) 7.5-325 MG per tablet Take 1 tablet by mouth every 4 (four) hours as needed. 05/08/14   Lily Kocher, PA-C  loratadine-pseudoephedrine (CLARITIN-D 12 HOUR) 5-120 MG per tablet Take 1 tablet by mouth 2 (two) times daily. 05/08/14   Lily Kocher, PA-C  naproxen (NAPROSYN) 500 MG tablet Take 1 tablet (500 mg total) by mouth 2 (two) times daily. Patient not taking: Reported on 05/08/2014 08/23/13   Rolland Porter, MD   BP 133/88 mmHg  Pulse 89  Temp(Src) 99.3 F (37.4 C) (Oral)  Resp 18  Ht 6\' 1"  (1.854 m)  Wt 235 lb (106.595 kg)  BMI 31.01 kg/m2  SpO2 98% Physical Exam  1135: Physical examination:  Nursing notes reviewed; Vital signs and O2 SAT reviewed;  Constitutional: Well developed, Well nourished, Well hydrated,  In no acute distress; Head:  Normocephalic, atraumatic; Eyes: EOMI, PERRL, No scleral icterus; ENMT: TM's clear bilat. +edemetous nasal turbinates bilat with clear rhinorrhea. Mouth and pharynx without lesions. No tonsillar exudates. No intra-oral edema. No submandibular or sublingual edema. No hoarse voice, no drooling, no stridor. No pain with manipulation of larynx. No trismus. Mouth and pharynx normal, Mucous membranes moist; Neck: Supple, Full range of  motion, No lymphadenopathy; Cardiovascular: Regular rate and rhythm, No murmur, rub, or gallop; Respiratory: Breath sounds clear & equal bilaterally, No rales, rhonchi, wheezes.  Speaking full sentences with ease, Normal respiratory effort/excursion; Chest: Nontender, Movement normal; Abdomen: Soft, Nontender, Nondistended, Normal bowel sounds; Genitourinary: No CVA tenderness; Extremities: Pulses normal, No tenderness, No edema, No calf edema or asymmetry.; Neuro: AA&Ox3, Major CN grossly intact.  Speech clear. No gross focal motor or sensory deficits in extremities.; Skin: Color normal, Warm, Dry.   ED Course  Procedures    EKG Interpretation None      MDM  MDM Reviewed: previous chart, nursing note and vitals Interpretation: x-ray      Dg Chest 2 View 08/06/2014   CLINICAL DATA:  Cough, fever, shortness of breath  EXAM: CHEST  2 VIEW  COMPARISON:  None.  FINDINGS: Lungs are clear.  No pleural effusion or pneumothorax.  The heart is normal in size.  Visualized osseous structures are within normal limits.  Cervical spine fixation hardware.  IMPRESSION: Normal chest radiographs.   Electronically Signed   By: Julian Hy M.D.   On: 08/06/2014 11:53    1200:  CXR without infiltrate. Tx symptomatically at this time. Dx and testing d/w pt.  Questions answered.  Verb understanding, agreeable to d/c home with outpt f/u.   Francine Graven, DO 08/07/14 [phone]

## 2014-08-06 NOTE — Discharge Instructions (Signed)
°Emergency Department Resource Guide °1) Find a Doctor and Pay Out of Pocket °Although you won't have to find out who is covered by your insurance plan, it is a good idea to ask around and get recommendations. You will then need to call the office and see if the doctor you have chosen will accept you as a new patient and what types of options they offer for patients who are self-pay. Some doctors offer discounts or will set up payment plans for their patients who do not have insurance, but you will need to ask so you aren't surprised when you get to your appointment. ° °2) Contact Your Local Health Department °Not all health departments have doctors that can see patients for sick visits, but many do, so it is worth a call to see if yours does. If you don't know where your local health department is, you can check in your phone book. The CDC also has a tool to help you locate your state's health department, and many state websites also have listings of all of their local health departments. ° °3) Find a Walk-in Clinic °If your illness is not likely to be very severe or complicated, you may want to try a walk in clinic. These are popping up all over the country in pharmacies, drugstores, and shopping centers. They're usually staffed by nurse practitioners or physician assistants that have been trained to treat common illnesses and complaints. They're usually fairly quick and inexpensive. However, if you have serious medical issues or chronic medical problems, these are probably not your best option. ° °No Primary Care Doctor: °- Call [facility] at  832-8000 - they can help you locate a primary care doctor that  accepts your insurance, provides certain services, etc. °- Physician Referral Service- 1-800-533-3463 ° °Chronic Pain Problems: °Organization         Address  Phone   Notes  °[facility]  (336) 297-2271 Patients need to be referred by their primary care doctor.  ° °Medication  Assistance: °Organization         Address  Phone   Notes  °[place] [facility] [address] °[place], [place] [address] (336) 641-8030 --Must be a resident of [place] °-- Must have NO insurance coverage whatsoever (no Medicaid/ Medicare, etc.) °-- The pt. MUST have a primary care doctor that directs their care regularly and follows them in the community °  °[facility]  (866) 331-1348   °[employer]  (888) 892-1162   ° °Agencies that provide inexpensive medical care: °Organization         Address  Phone   Notes  °[facility]  (336) 832-8035   °[facility]    (336) 832-7272   °[facility] [address] °[place], [place] [address] (336) 832-4777   °[facility] [address], °[place] (336) 271-4999   °[facility]    (336) 373-0678   °[facility]    (336) 272-1050   °[facility] ° [address], [place] Phone:  (336) 832-4444, Fax:  (336) 832-4440 Hours of Operation:  9 am - 6 pm, M-F.  Also accepts Medicaid/Medicare and self-pay.  °[facility] ° [address], [place] Phone: (336) 832-3150, Fax: (336) 832-3151. Hours of Operation:  8:30 am - 5:30 pm, M-F.  Also accepts Medicaid and self-pay.  °[facility] [place] [address], [place] Phone: (336) 878-6027   °[facility] [address], [place], [place] (336)723-1848, Ext. 123 Mondays & Thursdays: 7-9 AM.  First 15 patients are seen on a first come, first serve basis. °  ° °Medicaid-accepting [place] Providers: ° °Organization         Address  Phone   Notes  °[facility] [address], [place] (336) 641-2100 Also accepts self-pay patients.  °[facility] [address], [place] ° (336) 856-9996   °[facility] [address], [place]  (336) 288-8857   °[facility] [address] [place] [address], [place] (336) 299-7000   °Veita Bland [address], [place]  ° (336) 373-1557 Only accepts [place] Access Medicaid patients after they have their name applied to their card.  ° °Self-Pay (no insurance) in [place]: ° °Organization         Address  Phone   Notes  °Sickle Cell Patients, [facility] [address], [place] (336) 832-1970   °[facility] [address], [place] (336) 832-4400   °[facility] ° [address], [place] (336) 992-4800   °[facility]/Dr. Osei-Bonsu ° [address] [place] [address], [place] or [address], [place] (336) 841-8500 Phone number for both [place] and [place] locations is the same.  °[facility] [address], [place] (336) 299-0000   °[facility] [address] [place] [address], [place] or [address] (336) 852-7530 °(336) 878-2260   °[facility] [address], [place] (336) 350-1642, phone; (336) 294-5005, fax Sees patients 1st and 3rd Saturday of every month.  Must not qualify for public or private insurance (i.e. Medicaid, Medicare, [place] Health Choice, Veterans' Benefits) • Household income should be no more than 200% of the poverty level •The clinic cannot treat you if you are pregnant or think you are pregnant • Sexually transmitted diseases are not treated at the clinic.  ° ° °Dental Care: °Organization         Address  Phone  Notes  °[place] [facility] [address],  (336) 641-6152 Accepts children up to age 21 who are enrolled in Medicaid or [place] Health Choice; pregnant women with a Medicaid card; and children who have applied for Medicaid or [place] Health Choice, but were declined, whose parents can pay a reduced fee at time of service.  °[place]  [facility] [place]  [address], [place] (336) 641-7733 Accepts children up to age 21 who are enrolled in Medicaid or [place] Health Choice; pregnant women with a Medicaid card; and children who have applied for Medicaid or [place] Health Choice, but were declined, whose parents can pay a reduced fee at time of service.  °[facility] ° [address],  (336) 641-4533 Patients are seen by appointment only. Walk-ins are not accepted. [facility] will see patients 18 years of age and older. °Monday - Tuesday (8am-5pm) °Most Wednesdays (8:30-5pm) °$30 per visit, cash only  °[facility] ° [address], [place] (336) 641-4533 Patients are seen by appointment only. Walk-ins are not accepted. [facility] will see patients 18 years of age and older. °One   Wednesday Evening (Monthly: Volunteer Based).  $30 per visit, cash only  °[facility]  (919) 537-3737 for adults; Children under age 4, call Graduate Pediatric Dentistry at (919) [phone]. Children aged 4-14, please call (919) [phone] to request a pediatric application. ° Dental services are provided in all areas of dental care including fillings, crowns and bridges, complete and partial dentures, implants, gum treatment, root canals, and extractions. Preventive care is also provided. Treatment is provided to both adults and children. °Patients are selected via a lottery and there is often a waiting list. °  °[facility] [address], °[place] ° (336) 763-8833 [email or web address] °  °[facility] [address], [place], [place] (336)723-1848, Ext. 123 Second and Fourth Thursday of each month, opens at 6:30 AM; Clinic ends at 9 AM.  Patients are seen on a first-come first-served basis, and a limited number are seen during each clinic.  ° °[facility] ° [address], [place], [place] (336) 723-7904    Eligibility Requirements °You must have lived in [place], or [place] counties for at least the last three months. °  You cannot be eligible for state or federal sponsored healthcare insurance, including Veterans Administration, Medicaid, or Medicare. °  You generally cannot be eligible for healthcare insurance through your employer.  °  How to apply: °Eligibility screenings are held every Tuesday and Wednesday afternoon from 1:00 pm until 4:00 pm. You do not need an appointment for the interview!  °[facility] [address], [place], [place] 336-631-2330   °[facility]  336-342-8273   °[facility]  336-703-3100   °[facility]  336-570-6415   ° °Behavioral Health Resources in the Community: °Intensive Outpatient Programs °Organization         Address  Phone  Notes  °[facility] [address], [place], [place] 336-878-6098   °[facility] [address], [place] [place], [place] 336-832-9800   °[facility] [address], [place], [place] ° 336-882-2125   °[facility] [address],  °[place], [place] 1-800-853-5163 or 336-641-4981   °Substance Abuse Resources °Organization         Address  Phone  Notes  °[facility]  336-882-2125   °[facility]  336-784-9470   °[facility]  336-285-9073   °[facility]  336-845-3988   °Residential & Outpatient Substance Abuse Program  1-800-659-3381   °Psychological Services °Organization         Address  Phone  Notes  °[facility]  336- 832-9600   °[facility]  336- 378-7881   °[facility] [address], [place] 1-800-853-5163 or 336-641-4981   ° °Mobile Crisis Teams °Organization         Address  Phone  Notes  °[facility], Mobile Crisis Care Unit  1-877-626-1772   °Assertive °[facility] ° [address]  [place], [place] 336-834-9664   °Sharon DeEsch [address] °[place] 336-554-5454   ° °Self-Help/Support Groups °Organization         Address  Phone             Notes  °[facility] - variety of support groups  336- 373-1402 Call for more information  °Narcotics Anonymous (NA), [facility] [address], °[place]   2 meetings at this location  ° °  Residential Treatment Programs Organization         Address  Phone  Notes  [facility] [address]    [place]  1-801-753-2990   [facility]  [address], [place] [address], [place], [place]   [facility] [place], [place] [phone] Admissions: 8am-3pm M-F  [facility] [address]    [place], [place] 539-767-3419   [facility] [address] [place], [place], [place]   [facility] [address],  [place], [place]   [facility] Intensive Outpatient [address], [place], [place]   [facility] [address]  [place], [place] 1-406-540-0090 or (743) 609-3056   [facility] [address] [place], [place] Accepts Medicaid  [facility] [address],  [place] 1-(405) 725-6295 Substance Abuse/Addiction Treatment   [facility]         Address  Phone  Notes  [facility]  772-406-2005   Domenic Schwab, PhD [address] Arlis [place], [place]   7258256399 or (515)295-3177   [facility] [place], [place] (940)[phone]   [facility] [address], [place], [place] [phone] Insurance/Medicaid/sponsorship through [facility] and Families [address]                                    [place], [place] (647)705-0902 [place] [address], [place] [phone]    Dr. Adele Schilder  ([phone]) [phone]   [facility] Dept. 1) [address] [place] 2) [place] 3)  [address], [place] [phone]  [phone]   [place] 713-349-2191 or [phone] (After Hours)      Take over the counter decongestant (formulated for patients with high blood pressure), as directed on packaging, for the next week. Take an over the counter antihistamine (such as claritin, zyrtec, allegra or benadryl), as directed on packaging, for the next 2 weeks.  Use over the counter normal saline nasal spray, as instructed in the Emergency Department, several times per day for the next 2 weeks.  Call your regular medical doctor on Tuesday to schedule a follow up appointment this week.  Return to the Emergency Department immediately if worsening.

## 2014-08-06 NOTE — ED Notes (Signed)
Patient c/o cold like symptoms since Friday. Productive cough, fever, and sinus pressure. Per patient thick green sputum. Denies any body aches. Per patient took "sinus pill" last night.

## 2014-09-25 ENCOUNTER — Emergency Department (HOSPITAL_COMMUNITY)
Admission: EM | Admit: 2014-09-25 | Discharge: 2014-09-25 | Disposition: A | Discharge status: Home / Self Care | Payer: Medicare Other | Attending: Emergency Medicine | Admitting: Emergency Medicine

## 2014-09-25 ENCOUNTER — Encounter (HOSPITAL_COMMUNITY): Payer: Self-pay

## 2014-09-25 DIAGNOSIS — L259 Unspecified contact dermatitis, unspecified cause: Secondary | ICD-10-CM | POA: Diagnosis not present

## 2014-09-25 DIAGNOSIS — Z79899 Other long term (current) drug therapy: Secondary | ICD-10-CM | POA: Diagnosis not present

## 2014-09-25 DIAGNOSIS — I10 Essential (primary) hypertension: Secondary | ICD-10-CM | POA: Diagnosis not present

## 2014-09-25 DIAGNOSIS — Z8709 Personal history of other diseases of the respiratory system: Secondary | ICD-10-CM | POA: Diagnosis not present

## 2014-09-25 DIAGNOSIS — R21 Rash and other nonspecific skin eruption: Secondary | ICD-10-CM | POA: Diagnosis present

## 2014-09-25 DIAGNOSIS — Z8719 Personal history of other diseases of the digestive system: Secondary | ICD-10-CM | POA: Insufficient documentation

## 2014-09-25 DIAGNOSIS — E780 Pure hypercholesterolemia: Secondary | ICD-10-CM | POA: Insufficient documentation

## 2014-09-25 MED ORDER — PREDNISONE 10 MG PO TABS: 40.0000 mg | ORAL_TABLET | Freq: Every day | ORAL | Status: DC

## 2014-09-25 MED ORDER — PREDNISONE 50 MG PO TABS
60.0000 mg | ORAL_TABLET | Freq: Once | ORAL | Status: AC
  Administered 2014-09-25: 60 mg via ORAL
  Filled 2014-09-25 (×2): qty 1

## 2014-09-25 NOTE — Discharge Instructions (Signed)
Take the prednisone as directed. Expect some improvement over the next few days. Return for any new or worse symptoms.

## 2014-09-25 NOTE — ED Notes (Signed)
Pt reports did some yard work and got into poison [employer] Saturday.  C/O rash and itching from head to toe.

## 2014-09-25 NOTE — ED Notes (Signed)
MD at bedside. 

## 2014-09-25 NOTE — ED Provider Notes (Signed)
CSN: [id number]     Arrival date & time 09/25/14  0623 History   First MD Initiated Contact with Joseph Pruitt 09/25/14 [phone]     Chief Complaint  Joseph Pruitt presents with  . Poison Oak     (Consider location/radiation/quality/duration/timing/severity/associated sxs/prior Treatment) The history is provided by the Joseph Pruitt.   Joseph Pruitt working outdoors on Saturday has developed poison oak or poison ivy Joseph Pruitt with rash that's itchy predominantly on the top of his head arms and legs.  Past Medical History  Diagnosis Date  . Stomach ulcer   . Hypertension   . High cholesterol   . Environmental allergies    Past Surgical History  Procedure Laterality Date  . Spine surgery    . Hand surgery    . Achilles tendon surgery     No family history on file. History  Substance Use Topics  . Smoking status: Never Smoker   . Smokeless tobacco: Never Used  . Alcohol Use: No    Review of Systems  Constitutional: Negative for fever.  HENT: Negative for congestion.   Eyes: Negative for redness.  Respiratory: Negative for shortness of breath.   Cardiovascular: Negative for chest pain.  Gastrointestinal: Negative for abdominal pain.  Genitourinary: Negative for dysuria.  Musculoskeletal: Negative for back pain.  Skin: Positive for rash.  Neurological: Negative for headaches.  Hematological: Does not bruise/bleed easily.  Psychiatric/Behavioral: Negative for confusion.      Allergies  Review of Joseph Pruitt's allergies indicates no known allergies.  Home Medications   Prior to Admission medications   Medication Sig Start Date End Date Taking? Authorizing Provider  atorvastatin (LIPITOR) 40 MG tablet Take 1 tablet by mouth daily. 04/14/14   Historical Provider, MD  benzonatate (TESSALON) 100 MG capsule Take 1 capsule (100 mg total) by mouth 3 (three) times daily as needed for cough. 08/06/14   Francine Graven, DO  cyclobenzaprine (FLEXERIL) 10 MG tablet Take 1 tablet (10 mg total) by mouth 3 (three)  times daily as needed for muscle spasms. Joseph Pruitt not taking: Reported on 05/08/2014 08/23/13   Rolland Porter, MD  diazepam (VALIUM) 5 MG tablet Take 1 tablet (5 mg total) by mouth every 6 (six) hours as needed for anxiety (spasms). Joseph Pruitt not taking: Reported on 05/08/2014 09/07/13   Elnora Morrison, MD  hydrochlorothiazide (HYDRODIURIL) 25 MG tablet Take 1 tablet by mouth daily. 04/14/14   Historical Provider, MD  HYDROcodone-acetaminophen (NORCO) 7.5-325 MG per tablet Take 1 tablet by mouth every 4 (four) hours as needed. 05/08/14   Lily Kocher, PA-C  loratadine-pseudoephedrine (CLARITIN-D 12 HOUR) 5-120 MG per tablet Take 1 tablet by mouth 2 (two) times daily. 05/08/14   Lily Kocher, PA-C  naproxen (NAPROSYN) 500 MG tablet Take 1 tablet (500 mg total) by mouth 2 (two) times daily. Joseph Pruitt not taking: Reported on 05/08/2014 08/23/13   Rolland Porter, MD  predniSONE (DELTASONE) 10 MG tablet Take 4 tablets (40 mg total) by mouth daily. 09/25/14   Fredia Sorrow, MD   BP 148/100 mmHg  Pulse 62  Temp(Src) 97.8 F (36.6 C) (Oral)  Resp 20  Ht 6\' 1"  (1.854 m)  Wt 235 lb (106.595 kg)  BMI 31.01 kg/m2  SpO2 98% Physical Exam  Constitutional: He is oriented to person, place, and time. He appears well-developed and well-nourished. No distress.  HENT:  Head: Normocephalic and atraumatic.  Mouth/Throat: Oropharynx is clear and moist.  Eyes: Conjunctivae and EOM are normal. Pupils are equal, round, and reactive to light.  Neck: Normal range of motion.  Cardiovascular: Normal rate, regular rhythm and normal heart sounds.   No murmur heard. Pulmonary/Chest: Effort normal and breath sounds normal. No respiratory distress.  Abdominal: Soft. Bowel sounds are normal. There is no tenderness.  Musculoskeletal: Normal range of motion.  Neurological: He is alert and oriented to person, place, and time. No cranial nerve deficit. He exhibits normal muscle tone. Coordination normal.  Skin: Skin is warm. Rash noted.   Joseph Pruitt with papular rash on top of the scalp arms and legs no vesicles at this point in time. Bit of an erythema base.  Nursing note and vitals reviewed.   ED Course  Procedures (including critical care time) Labs Review Labs Reviewed - No data to display  Imaging Review No results found.   EKG Interpretation None      MDM   Final diagnoses:  Contact dermatitis   Symptoms and physical findings consistent with a mild contact dermatitis. Will treat the with prednisone.    Fredia Sorrow, MD 09/25/14 0800

## 2014-10-16 ENCOUNTER — Encounter (HOSPITAL_COMMUNITY): Payer: Self-pay

## 2014-10-16 ENCOUNTER — Emergency Department (HOSPITAL_COMMUNITY)
Admission: EM | Admit: 2014-10-16 | Discharge: 2014-10-16 | Disposition: A | Discharge status: Home / Self Care | Payer: Medicare Other | Attending: Emergency Medicine | Admitting: Emergency Medicine

## 2014-10-16 DIAGNOSIS — R21 Rash and other nonspecific skin eruption: Secondary | ICD-10-CM | POA: Diagnosis not present

## 2014-10-16 DIAGNOSIS — I10 Essential (primary) hypertension: Secondary | ICD-10-CM | POA: Diagnosis not present

## 2014-10-16 DIAGNOSIS — Z79899 Other long term (current) drug therapy: Secondary | ICD-10-CM | POA: Diagnosis not present

## 2014-10-16 DIAGNOSIS — E780 Pure hypercholesterolemia: Secondary | ICD-10-CM | POA: Insufficient documentation

## 2014-10-16 DIAGNOSIS — Z8719 Personal history of other diseases of the digestive system: Secondary | ICD-10-CM | POA: Insufficient documentation

## 2014-10-16 MED ORDER — PREDNISONE 50 MG PO TABS
60.0000 mg | ORAL_TABLET | Freq: Once | ORAL | Status: AC
  Administered 2014-10-16: 60 mg via ORAL
  Filled 2014-10-16 (×2): qty 1

## 2014-10-16 MED ORDER — DIPHENHYDRAMINE HCL 25 MG PO TABS: 50.0000 mg | ORAL_TABLET | ORAL | Status: DC | PRN

## 2014-10-16 NOTE — ED Provider Notes (Signed)
CSN: [id number]     Arrival date & time 10/16/14  0612 History   First MD Initiated Contact with Patient 10/16/14 [phone]     Chief Complaint  Patient presents with  . Insect Bite    Patient is a 49 y.o. male presenting with rash. The history is provided by the patient.  Rash Location:  Shoulder/arm Shoulder/arm rash location:  R upper arm Severity:  Mild Onset quality:  Gradual Duration:  1 day Timing:  Constant Progression:  Worsening Chronicity:  New Relieved by:  Nothing Worsened by:  Nothing tried Associated symptoms: no diarrhea, no shortness of breath, no throat swelling and no tongue swelling     Past Medical History  Diagnosis Date  . Stomach ulcer   . Hypertension   . High cholesterol   . Environmental allergies    Past Surgical History  Procedure Laterality Date  . Spine surgery    . Hand surgery    . Achilles tendon surgery     History reviewed. No pertinent family history. History  Substance Use Topics  . Smoking status: Never Smoker   . Smokeless tobacco: Never Used  . Alcohol Use: No    Review of Systems  Respiratory: Negative for shortness of breath.   Gastrointestinal: Negative for diarrhea.  Skin: Positive for rash.      Allergies  Review of patient's allergies indicates no known allergies.  Home Medications   Prior to Admission medications   Medication Sig Start Date End Date Taking? Authorizing Provider  atorvastatin (LIPITOR) 40 MG tablet Take 1 tablet by mouth daily. 04/14/14  Yes Historical Provider, MD  cyclobenzaprine (FLEXERIL) 10 MG tablet Take 1 tablet (10 mg total) by mouth 3 (three) times daily as needed for muscle spasms. 08/23/13  Yes Rolland Porter, MD  hydrochlorothiazide (HYDRODIURIL) 25 MG tablet Take 1 tablet by mouth daily. 04/14/14  Yes Historical Provider, MD  benzonatate (TESSALON) 100 MG capsule Take 1 capsule (100 mg total) by mouth 3 (three) times daily as needed for cough. 08/06/14   Francine Graven, DO  diphenhydrAMINE  (BENADRYL) 25 MG tablet Take 2 tablets (50 mg total) by mouth every 4 (four) hours as needed for itching. 10/16/14   Ripley Fraise, MD  loratadine-pseudoephedrine (CLARITIN-D 12 HOUR) 5-120 MG per tablet Take 1 tablet by mouth 2 (two) times daily. 05/08/14   Lily Kocher, PA-C   BP 131/85 mmHg  Pulse 78  Temp(Src) 97.7 F (36.5 C) (Oral)  Resp 20  Ht 6\' 1"  (1.854 m)  Wt 239 lb (108.41 kg)  BMI 31.54 kg/m2  SpO2 98% Physical Exam CONSTITUTIONAL: Well developed/well nourished HEAD: Normocephalic/atraumatic EYES: EOMI ENMT: Mucous membranes moist, no angioedema NECK: supple no meningeal signs LUNGS: Lungs are clear to auscultation bilaterally, no apparent distress ABDOMEN: soft, nontender, no rebound or guarding, bowel sounds noted throughout abdomen NEURO: Pt is awake/alert/appropriate, moves all extremitiesx4.  No facial droop.   EXTREMITIES: pulses normal/equal, full ROM, scattered rash to right upper extremity.  No rash on palms.   SKIN: warm, color normal PSYCH: no abnormalities of mood noted, alert and oriented to situation   ED Course  Procedures  Pt appears to be having mild reaction He thinks it is "bug bites" Advised use of benadryl Stable for d/c home   MDM   Final diagnoses:  Rash    Nursing notes including past medical history and social history reviewed and considered in documentation     Ripley Fraise, MD 10/16/14 [phone]

## 2014-10-16 NOTE — ED Notes (Signed)
Patient c/o bug bites to right arm, waist, and left foot.

## 2014-10-16 NOTE — ED Notes (Signed)
Pt made aware to return if symptoms worsen or if any life threatening symptoms occur.   

## 2014-12-11 ENCOUNTER — Emergency Department (HOSPITAL_COMMUNITY)
Admission: EM | Admit: 2014-12-11 | Discharge: 2014-12-11 | Disposition: A | Discharge status: Home / Self Care | Payer: Medicare Other | Attending: Emergency Medicine | Admitting: Emergency Medicine

## 2014-12-11 ENCOUNTER — Encounter (HOSPITAL_COMMUNITY): Payer: Self-pay | Admitting: *Deleted

## 2014-12-11 DIAGNOSIS — L299 Pruritus, unspecified: Secondary | ICD-10-CM | POA: Diagnosis present

## 2014-12-11 DIAGNOSIS — Z8639 Personal history of other endocrine, nutritional and metabolic disease: Secondary | ICD-10-CM | POA: Insufficient documentation

## 2014-12-11 DIAGNOSIS — Z7951 Long term (current) use of inhaled steroids: Secondary | ICD-10-CM | POA: Diagnosis not present

## 2014-12-11 DIAGNOSIS — B029 Zoster without complications: Secondary | ICD-10-CM | POA: Insufficient documentation

## 2014-12-11 DIAGNOSIS — I10 Essential (primary) hypertension: Secondary | ICD-10-CM | POA: Insufficient documentation

## 2014-12-11 DIAGNOSIS — Z8719 Personal history of other diseases of the digestive system: Secondary | ICD-10-CM | POA: Insufficient documentation

## 2014-12-11 MED ORDER — VALACYCLOVIR HCL 1 G PO TABS: 1000.0000 mg | ORAL_TABLET | Freq: Three times a day (TID) | ORAL | Status: DC

## 2014-12-11 NOTE — Discharge Instructions (Signed)
Your exam tonight show that the rash you have may be shingles. This is because of the pattern of the rash.  Have you doctor recheck the area when you follow up with him. Take Benadryl for itching and take the antiviral medication for shingles. Follow up with your doctor as scheduled.

## 2014-12-11 NOTE — ED Notes (Signed)
Pt alert & oriented x4, stable gait. Patient given discharge instructions, paperwork & prescription(s). Patient  instructed to stop at the registration desk to finish any additional paperwork. Patient verbalized understanding. Pt left department w/ no further questions. 

## 2014-12-11 NOTE — ED Provider Notes (Signed)
Medical screening examination/treatment/procedure(s) were conducted as a shared visit with non-physician practitioner(s) and myself.  I personally evaluated the patient during the encounter.   EKG Interpretation None      Patient seen by me.  Patient reporting the outbreak of a rash that had some burning and itching actually started yesterday, worse today. Over his right shoulder upper arm, and a linear pattern and on the back part of the right shoulder. No true vesicles but highly suggestive of perhaps shingles. Patient had something similar happen in the past. No formal diagnosis. Will treat as if it shingles and also will treat for the itching with Benadryl. Patient has follow[facility] later this week. Patient has had chickenpox in the past. Patient nontoxic no acute distress.  Fredia Sorrow, MD 12/11/14 [phone]

## 2014-12-11 NOTE — ED Provider Notes (Signed)
CSN: [id number]     Arrival date & time 12/11/14  2217 History   First MD Initiated Contact with Patient 12/11/14 2228     Chief Complaint  Patient presents with  . Insect Bite     (Consider location/radiation/quality/duration/timing/severity/associated sxs/prior Treatment) The history is provided by the patient. No language interpreter was used.   Joseph Pruitt is a 49 y.o. male who presents to the ED with multiple red raised areas to the right shoulder. He states that he thought something bit him yesterday and today he is having increased itching and burning to the area. He denies swelling of throat, difficulty swallowing, respiratory symptoms or any other problems.   Past Medical History  Diagnosis Date  . Stomach ulcer   . Hypertension   . High cholesterol   . Environmental allergies    Past Surgical History  Procedure Laterality Date  . Spine surgery    . Hand surgery    . Achilles tendon surgery     History reviewed. No pertinent family history. Social History  Substance Use Topics  . Smoking status: Never Smoker   . Smokeless tobacco: Never Used  . Alcohol Use: No    Review of Systems Negative except as stated in HPI   Allergies  Review of patient's allergies indicates no known allergies.  Home Medications   Prior to Admission medications   Medication Sig Start Date End Date Taking? Authorizing Provider  fluticasone (FLONASE) 50 MCG/ACT nasal spray 1 spray by Each Nare route daily. 10/05/14 10/05/15 Yes Historical Provider, MD  oxyCODONE-acetaminophen (PERCOCET/ROXICET) 5-325 MG tablet Take 1 tablet by mouth every 6 (six) hours as needed for moderate pain or severe pain.  11/22/14  Yes Historical Provider, MD  valACYclovir (VALTREX) 1000 MG tablet Take 1 tablet (1,000 mg total) by mouth 3 (three) times daily. 12/11/14   Love Milbourne Bunnie Pion, NP   BP 139/99 mmHg  Pulse 77  Temp(Src) 97.7 F (36.5 C) (Oral)  Resp 16  Ht 6\' 1"  (1.854 m)  Wt 241 lb (109.317 kg)  BMI  31.80 kg/m2  SpO2 98% Physical Exam  Constitutional: He is oriented to person, place, and time. He appears well-developed and well-nourished. No distress.  HENT:  Head: Normocephalic and atraumatic.  Mouth/Throat: Uvula is midline, oropharynx is clear and moist and mucous membranes are normal.  Eyes: Conjunctivae and EOM are normal.  Neck: Normal range of motion. Neck supple.  Cardiovascular: Normal rate and regular rhythm.   Pulmonary/Chest: Effort normal and breath sounds normal. No respiratory distress.  Musculoskeletal: Normal range of motion.       Arms: Multiple raised red areas that start on the posterior aspect of the right shoulder and continue to the right upper arm.   Neurological: He is alert and oriented to person, place, and time. No cranial nerve deficit.  Skin: Skin is warm and dry.  Psychiatric: He has a normal mood and affect. His behavior is normal.  Nursing note and vitals reviewed.   ED Course  Procedures  Dr. Rogene Houston in to see the patient and agrees with assessment/plan. MDM  49 y.o. male with itching and burning of the right arm that started 24 hours prior to ED visit. Will treat for possible shingles. Patient has follow up with his PCP at [facility] this week and will have it rechecked then. Also has elevated BP that he will have rechecked.  Final diagnoses:  [place]od[place], NP 12/12/14 2327

## 2014-12-11 NOTE — ED Notes (Signed)
Pt reporting insect bite on right shoulder that happened yesterday.  Reporting increasing itch and buring today.

## 2015-10-22 ENCOUNTER — Emergency Department (HOSPITAL_COMMUNITY)
Admission: EM | Admit: 2015-10-22 | Discharge: 2015-10-22 | Disposition: A | Discharge status: Home / Self Care | Payer: Medicare Other | Attending: Emergency Medicine | Admitting: Emergency Medicine

## 2015-10-22 ENCOUNTER — Emergency Department (HOSPITAL_COMMUNITY): Payer: Medicare Other

## 2015-10-22 ENCOUNTER — Encounter (HOSPITAL_COMMUNITY): Payer: Self-pay | Admitting: Emergency Medicine

## 2015-10-22 DIAGNOSIS — Y990 Civilian activity done for income or pay: Secondary | ICD-10-CM | POA: Diagnosis not present

## 2015-10-22 DIAGNOSIS — S0992XA Unspecified injury of nose, initial encounter: Secondary | ICD-10-CM | POA: Diagnosis present

## 2015-10-22 DIAGNOSIS — I10 Essential (primary) hypertension: Secondary | ICD-10-CM | POA: Diagnosis not present

## 2015-10-22 DIAGNOSIS — Y9389 Activity, other specified: Secondary | ICD-10-CM | POA: Insufficient documentation

## 2015-10-22 DIAGNOSIS — S0121XA Laceration without foreign body of nose, initial encounter: Secondary | ICD-10-CM | POA: Insufficient documentation

## 2015-10-22 DIAGNOSIS — Y929 Unspecified place or not applicable: Secondary | ICD-10-CM | POA: Insufficient documentation

## 2015-10-22 DIAGNOSIS — W228XXA Striking against or struck by other objects, initial encounter: Secondary | ICD-10-CM | POA: Diagnosis not present

## 2015-10-22 DIAGNOSIS — S0033XA Contusion of nose, initial encounter: Secondary | ICD-10-CM

## 2015-10-22 DIAGNOSIS — Z79899 Other long term (current) drug therapy: Secondary | ICD-10-CM | POA: Diagnosis not present

## 2015-10-22 NOTE — Discharge Instructions (Signed)
Return here for any signs of infection such as redness, swelling or increasing pain.  Keep the area dry.

## 2015-10-22 NOTE — ED Notes (Signed)
Patient transported to X-ray 

## 2015-10-22 NOTE — ED Triage Notes (Signed)
Pt c/o nose pain after he hit himself with wrench at work. Pt has small lac to the nose.

## 2015-10-25 NOTE — ED Provider Notes (Signed)
[facility] DEPT Provider Note   CSN: [id number] Arrival date & time: 10/22/15  2025     History   Chief Complaint Chief Complaint  Patient presents with  . Facial Injury    HPI Joseph Pruitt is a 50 y.o. male.  HPI   Joseph Pruitt is a 50 y.o. male who presents to the Emergency Department complaining of pain and laceration to his nose.  He states that he was using a wrench to loosen a bolt when the wrench slipped and struck him in the nose.  He reports minimal bleeding of the wound.  He denies headache, dizziness, LOC and epistaxis.  Td is up to date.   Past Medical History:  Diagnosis Date  . Environmental allergies   . High cholesterol   . Hypertension   . Stomach ulcer     Patient Active Problem List   Diagnosis Date Noted  . Head ache 09/21/2013  . Pain in joint, ankle and foot 01/20/2013  . Left leg weakness 01/20/2013  . CARPAL TUNNEL SYNDROME, HX OF 02/14/2009  . MONONEURITIS OF UNSPECIFIED SITE 11/22/2008  . 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 TENDINITIS, RIGHT WRIST 11/22/2008  . NECK PAIN 08/14/2008    Past Surgical History:  Procedure Laterality Date  . ACHILLES TENDON SURGERY    . HAND SURGERY    . SPINE SURGERY         Home Medications    Prior to Admission medications   Medication Sig Start Date End Date Taking? Authorizing Provider  fluticasone (FLONASE) 50 MCG/ACT nasal spray 1 spray by Each Nare route daily. 10/05/14 10/05/15  Historical Provider, MD  oxyCODONE-acetaminophen (PERCOCET/ROXICET) 5-325 MG tablet Take 1 tablet by mouth every 6 (six) hours as needed for moderate pain or severe pain.  11/22/14   Historical Provider, MD  valACYclovir (VALTREX) 1000 MG tablet Take 1 tablet (1,000 mg total) by mouth 3 (three) times daily. 12/11/14   [name], NP    Family History No family history on file.  Social History Social History  Substance Use Topics  . Smoking status: Never Smoker  . Smokeless tobacco:  Never Used  . Alcohol use No     Allergies   Review of patient's allergies indicates no known allergies.   Review of Systems Review of Systems  Constitutional: Negative for chills and fever.  Musculoskeletal: Negative for arthralgias, back pain and joint swelling.  Skin: Positive for wound.       Laceration   Neurological: Negative for dizziness, weakness, numbness and headaches.  Hematological: Does not bruise/bleed easily.  All other systems reviewed and are negative.    Physical Exam Updated Vital Signs BP 121/100 (BP Location: Left Arm)   Pulse 72   Temp 98 F (36.7 C) (Tympanic)   Resp 20   Ht 6\' 1"  (1.854 m)   Wt 113.4 kg   SpO2 95%   BMI 32.98 kg/m   Physical Exam  Constitutional: He is oriented to person, place, and time. He appears well-developed and well-nourished. No distress.  HENT:  Head: Normocephalic and atraumatic.  Nose: Nose lacerations and sinus tenderness present. No mucosal edema, nasal deformity, septal deviation or nasal septal hematoma. No epistaxis.    Mouth/Throat: Oropharynx is clear and moist.  Eyes: Conjunctivae and EOM are normal. Pupils are equal, round, and reactive to light.  Neck: Normal range of motion. Neck supple.  Cardiovascular: Normal rate, regular rhythm and intact distal pulses.   No murmur heard. Pulmonary/Chest:  Effort normal and breath sounds normal. No respiratory distress.  Musculoskeletal: Normal range of motion. He exhibits no edema or tenderness.  Neurological: He is alert and oriented to person, place, and time. He exhibits normal muscle tone. Coordination normal.  Skin: Skin is warm. Laceration noted.  Psychiatric: He has a normal mood and affect.  Nursing note and vitals reviewed.    ED Treatments / Results  Labs (all labs ordered are listed, but only abnormal results are displayed) Labs Reviewed - No data to display  EKG  EKG Interpretation None       Radiology Dg Nasal Bones  Result Date:  10/22/2015 CLINICAL DATA:  Hit in nose with branch while working on car. Pain. Initial encounter. EXAM: NASAL BONES - 3+ VIEW COMPARISON:  CT head without contrast 09/07/2013 FINDINGS: The nasal bones are intact. No acute fracture is present. The visualized paranasal sinuses are clear. IMPRESSION: Negative nasal bone radiographs. Electronically Signed   By: San Morelle M.D.   On: 10/22/2015 22:33    Procedures Procedures (including critical care time)  Medications Ordered in ED Medications - No data to display   Initial Impression / Assessment and Plan / ED Course  I have reviewed the triage vital signs and the nursing notes.  Pertinent labs & imaging results that were available during my care of the patient were reviewed by me and considered in my medical decision making (see chart for details).   LACERATION REPAIR Performed by: Andris Brothers L. Authorized by: Hale Bogus Consent: Verbal consent obtained. Risks and benefits: risks, benefits and alternatives were discussed Consent given by: patient Patient identity confirmed: provided demographic data Prepped and Draped in normal sterile fashion Wound explored  Laceration Location: nose  Laceration Length: 1 cm  No Foreign Bodies seen or palpated  Anesthesia: none  Irrigation method: syringe Amount of cleaning: standard  Skin closure: steri-strips  Number of steri-strips: 2  Technique: topical application  Patient tolerance: Patient tolerated the procedure well with no immediate complications.  Clinical Course    Small superficial laceration to the bridge of the nose.  No edema, bleeding controlled.  Wound was cleaned with saline and approximated with two steri-strips.    XR neg for fx.  No epistaxis or LOC.  Stable for d/c return precautions given.   Final Clinical Impressions(s) / ED Diagnoses   Final diagnoses:  Laceration of nose, initial encounter  Contusion, nose, initial encounter     New Prescriptions Discharge Medication List as of 10/22/2015 10:47 PM       Inola Lisle, PA-C 10/25/15 0113    Margette Fast, MD 10/25/15 [phone]

## 2016-02-15 ENCOUNTER — Emergency Department (HOSPITAL_COMMUNITY)
Admission: EM | Admit: 2016-02-15 | Discharge: 2016-02-16 | Disposition: A | Discharge status: Home / Self Care | Payer: Medicare Other | Attending: Emergency Medicine | Admitting: Emergency Medicine

## 2016-02-15 ENCOUNTER — Encounter (HOSPITAL_COMMUNITY): Payer: Self-pay

## 2016-02-15 DIAGNOSIS — X088XXA Exposure to other specified smoke, fire and flames, initial encounter: Secondary | ICD-10-CM | POA: Diagnosis not present

## 2016-02-15 DIAGNOSIS — T2123XA Burn of second degree of upper back, initial encounter: Secondary | ICD-10-CM

## 2016-02-15 DIAGNOSIS — I10 Essential (primary) hypertension: Secondary | ICD-10-CM | POA: Insufficient documentation

## 2016-02-15 DIAGNOSIS — Y999 Unspecified external cause status: Secondary | ICD-10-CM | POA: Diagnosis not present

## 2016-02-15 DIAGNOSIS — Y929 Unspecified place or not applicable: Secondary | ICD-10-CM | POA: Insufficient documentation

## 2016-02-15 DIAGNOSIS — Z79899 Other long term (current) drug therapy: Secondary | ICD-10-CM | POA: Diagnosis not present

## 2016-02-15 DIAGNOSIS — J029 Acute pharyngitis, unspecified: Secondary | ICD-10-CM | POA: Diagnosis not present

## 2016-02-15 DIAGNOSIS — T310 Burns involving less than 10% of body surface: Secondary | ICD-10-CM | POA: Insufficient documentation

## 2016-02-15 DIAGNOSIS — Y9389 Activity, other specified: Secondary | ICD-10-CM | POA: Insufficient documentation
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
## 2016-02-16 ENCOUNTER — Emergency Department (HOSPITAL_COMMUNITY): Payer: Medicare Other

## 2016-02-16 DIAGNOSIS — T2123XA Burn of second degree of upper back, initial encounter: Secondary | ICD-10-CM | POA: Diagnosis not present

## 2016-02-16 LAB — RAPID STREP SCREEN (MED CTR MEBANE ONLY): Streptococcus, Group A Screen (Direct): NEGATIVE

## 2016-02-16 MED ORDER — IBUPROFEN 800 MG PO TABS
800.0000 mg | ORAL_TABLET | Freq: Once | ORAL | Status: AC
  Administered 2016-02-16: 800 mg via ORAL
  Filled 2016-02-16: qty 1

## 2016-02-16 MED ORDER — SILVER SULFADIAZINE 1 % EX CREA: 1.0000 "application " | TOPICAL_CREAM | Freq: Every day | CUTANEOUS | 0 refills | Status: DC

## 2016-02-16 MED ORDER — SILVER SULFADIAZINE 1 % EX CREA
TOPICAL_CREAM | Freq: Once | CUTANEOUS | Status: AC
  Administered 2016-02-16: 1 via TOPICAL
  Filled 2016-02-16: qty 50

## 2016-02-16 MED ORDER — DEXAMETHASONE 4 MG PO TABS
12.0000 mg | ORAL_TABLET | Freq: Once | ORAL | Status: AC
  Administered 2016-02-16: 12 mg via ORAL
  Filled 2016-02-16: qty 3

## 2016-02-16 NOTE — Discharge Instructions (Signed)
Change dressing and apply Silvadene once a day. Take ibuprofen or naproxen as needed for pain.

## 2016-02-18 LAB — CULTURE, GROUP A STREP (THRC)

## 2016-08-29 DIAGNOSIS — C189 Malignant neoplasm of colon, unspecified: Secondary | ICD-10-CM | POA: Insufficient documentation

## 2016-09-19 ENCOUNTER — Encounter (HOSPITAL_COMMUNITY): Payer: Self-pay

## 2016-09-19 ENCOUNTER — Emergency Department (HOSPITAL_COMMUNITY)
Admission: EM | Admit: 2016-09-19 | Discharge: 2016-09-19 | Disposition: A | Discharge status: Home / Self Care | Payer: Medicare Other | Attending: Emergency Medicine | Admitting: Emergency Medicine

## 2016-09-19 DIAGNOSIS — Z7982 Long term (current) use of aspirin: Secondary | ICD-10-CM | POA: Insufficient documentation

## 2016-09-19 DIAGNOSIS — Z79899 Other long term (current) drug therapy: Secondary | ICD-10-CM | POA: Diagnosis not present

## 2016-09-19 DIAGNOSIS — I10 Essential (primary) hypertension: Secondary | ICD-10-CM | POA: Diagnosis not present

## 2016-09-19 DIAGNOSIS — L237 Allergic contact dermatitis due to plants, except food: Secondary | ICD-10-CM | POA: Diagnosis not present

## 2016-09-19 DIAGNOSIS — R21 Rash and other nonspecific skin eruption: Secondary | ICD-10-CM | POA: Diagnosis present

## 2016-09-19 MED ORDER — PREDNISONE 20 MG PO TABS: 40.0000 mg | ORAL_TABLET | Freq: Every day | ORAL | 0 refills | Status: AC

## 2016-09-19 NOTE — Discharge Instructions (Signed)

## 2016-09-19 NOTE — ED Triage Notes (Signed)
Pt states he has poison oak to his arms, legs and top of his head.

## 2016-09-19 NOTE — ED Provider Notes (Signed)
[facility] DEPT Provider Note   CSN: [id number] Arrival date & time: 09/19/16  0865     History   Chief Complaint Chief Complaint  Patient presents with  . Rash    HPI Joseph Pruitt is a 51 y.o. male.  HPI  Rash to the bilateral ankles and wrists and scalp  Was in poison ivy recently Has not tried anything No assocaited sx Sx are constant and mild to modreate,  No itch to face / eyes.  Past Medical History:  Diagnosis Date  . Environmental allergies   . High cholesterol   . Hypertension   . Stomach ulcer     Patient Active Problem List   Diagnosis Date Noted  . Head ache 09/21/2013  . Pain in joint, ankle and foot 01/20/2013  . Left leg weakness 01/20/2013  . CARPAL TUNNEL SYNDROME, HX OF 02/14/2009  . MONONEURITIS OF UNSPECIFIED SITE 11/22/2008  . 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 TENDINITIS, RIGHT WRIST 11/22/2008  . NECK PAIN 08/14/2008    Past Surgical History:  Procedure Laterality Date  . ACHILLES TENDON SURGERY    . HAND SURGERY    . SPINE SURGERY         Home Medications    Prior to Admission medications   Medication Sig Start Date End Date Taking? Authorizing Provider  aspirin 81 MG chewable tablet Chew by mouth daily.   Yes [provider]  hydrochlorothiazide (HYDRODIURIL) 25 MG tablet Take 25 mg by mouth daily.   Yes [provider]  fluticasone (FLONASE) 50 MCG/ACT nasal spray 1 spray by Each Nare route daily. 10/05/14 10/05/15  [provider]  oxyCODONE-acetaminophen (PERCOCET/ROXICET) 5-325 MG tablet Take 1 tablet by mouth every 6 (six) hours as needed for moderate pain or severe pain.  11/22/14   [provider]  predniSONE (DELTASONE) 20 MG tablet Take 2 tablets (40 mg total) by mouth daily. 09/19/16 09/26/16  Noemi Chapel, MD  silver sulfADIAZINE (SILVADENE) 1 % cream Apply 1 application topically daily. 78/4/69   [name], MD  valACYclovir (VALTREX) 1000 MG tablet Take 1  tablet (1,000 mg total) by mouth 3 (three) times daily. 12/11/14   Ashley Murrain, NP    Family History No family history on file.  Social History Social History  Substance Use Topics  . Smoking status: Never Smoker  . Smokeless tobacco: Never Used  . Alcohol use No     Allergies   Patient has no known allergies.   Review of Systems Review of Systems  Constitutional: Negative for fever.  Skin: Positive for rash.     Physical Exam Updated Vital Signs BP (!) 130/93 (BP Location: Left Arm)   Pulse 64   Temp 97.8 F (36.6 C) (Oral)   Resp 18   SpO2 98%   Physical Exam  Constitutional: He appears well-developed and well-nourished. No distress.  HENT:  Head: Normocephalic and atraumatic.  Eyes: Conjunctivae are normal. Right eye exhibits no discharge. Left eye exhibits no discharge. No scleral icterus.  Cardiovascular: Intact distal pulses.   Pulmonary/Chest: Effort normal.  Neurological: He is alert. Coordination normal.  Skin:  Scattered small am't of vesicular linear lesions  Nursing note and vitals reviewed.    ED Treatments / Results  Labs (all labs ordered are listed, but only abnormal results are displayed) Labs Reviewed - No data to display   Radiology No results found.  Procedures Procedures (including critical care time)  Medications Ordered in ED Medications - No  data to display   Initial Impression / Assessment and Plan / ED Course  I have reviewed the triage vital signs and the nursing notes.  Pertinent labs & imaging results that were available during my care of the patient were reviewed by me and considered in my medical decision making (see chart for details).     Poison ivy Prednisone Home Stable No petechi / purpur  Final Clinical Impressions(s) / ED Diagnoses   Final diagnoses:  Poison ivy dermatitis    New Prescriptions New Prescriptions   PREDNISONE (DELTASONE) 20 MG TABLET    Take 2 tablets (40 mg total) by mouth  daily.     Noemi Chapel, MD 09/19/16 [phone]

## 2016-09-26 ENCOUNTER — Encounter (HOSPITAL_COMMUNITY): Payer: Self-pay | Admitting: Emergency Medicine

## 2016-09-26 ENCOUNTER — Emergency Department (HOSPITAL_COMMUNITY)
Admission: EM | Admit: 2016-09-26 | Discharge: 2016-09-26 | Disposition: A | Discharge status: Home / Self Care | Payer: Medicare Other | Attending: Emergency Medicine | Admitting: Emergency Medicine

## 2016-09-26 DIAGNOSIS — Z79899 Other long term (current) drug therapy: Secondary | ICD-10-CM | POA: Diagnosis not present

## 2016-09-26 DIAGNOSIS — C189 Malignant neoplasm of colon, unspecified: Secondary | ICD-10-CM | POA: Insufficient documentation

## 2016-09-26 DIAGNOSIS — Z85038 Personal history of other malignant neoplasm of large intestine: Secondary | ICD-10-CM | POA: Diagnosis not present

## 2016-09-26 DIAGNOSIS — Y829 Unspecified medical devices associated with adverse incidents: Secondary | ICD-10-CM | POA: Diagnosis not present

## 2016-09-26 DIAGNOSIS — Z08 Encounter for follow-up examination after completed treatment for malignant neoplasm: Secondary | ICD-10-CM | POA: Diagnosis present

## 2016-09-26 DIAGNOSIS — Z7982 Long term (current) use of aspirin: Secondary | ICD-10-CM | POA: Diagnosis not present

## 2016-09-26 DIAGNOSIS — I10 Essential (primary) hypertension: Secondary | ICD-10-CM | POA: Insufficient documentation

## 2016-09-26 HISTORY — DX: Malignant (primary) neoplasm, unspecified: C80.1

## 2016-09-26 NOTE — ED Provider Notes (Signed)
[name] DEPT Provider Note   CSN: [id number] Arrival date & time: 09/26/16  1954     History   Chief Complaint Chief Complaint  Patient presents with  . Chemotherapy Follow Up    Machine not working    HPI Joseph Pruitt is a 51 y.o. male.  HPI Patient with malfunctioning CADD chemotherapy pump. Starting yesterday was getting a 5-FU infusion with the plan 46 hour infusion. Today the machine started to alarm and it was told to clamp off. He called the number on the machine was told to remove the batteries and clamp the pump. Told to come into the ER. States he was told if he didn't do this he could infuse all the chemotherapy at once. Patient feels fine. No fevers. The plan was for him to have his cousin to  deaccess the pump and remove his Port-A-Cath tomorrow. Past Medical History:  Diagnosis Date  . Cancer [facility])    colon cancer  . Environmental allergies   . High cholesterol   . Hypertension   . Stomach ulcer     Patient Active Problem List   Diagnosis Date Noted  . Head ache 09/21/2013  . Pain in joint, ankle and foot 01/20/2013  . Left leg weakness 01/20/2013  . CARPAL TUNNEL SYNDROME, HX OF 02/14/2009  . MONONEURITIS OF UNSPECIFIED SITE 11/22/2008  . 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 TENDINITIS, RIGHT WRIST 11/22/2008  . NECK PAIN 08/14/2008    Past Surgical History:  Procedure Laterality Date  . ACHILLES TENDON SURGERY    . HAND SURGERY    . SPINE SURGERY         Home Medications    Prior to Admission medications   Medication Sig Start Date End Date Taking? Authorizing Provider  aspirin 81 MG chewable tablet Chew 81 mg by mouth daily.    Yes [provider]  fluorouracil in sodium chloride 0.9 % 50 mL Inject into the vein every 14 (fourteen) days.    Yes [provider]  fluticasone (FLONASE) 50 MCG/ACT nasal spray 1 spray by Each Nare route daily as needed for allergies 10/05/14 09/26/16 Yes [provider]  hydrochlorothiazide (HYDRODIURIL) 25 MG tablet Take 25 mg by mouth daily.   Yes [provider]  ondansetron (ZOFRAN) 8 MG tablet Take 8 mg by mouth every 8 (eight) hours as needed for nausea or vomiting.  09/11/16  Yes [provider]  oxyCODONE-acetaminophen (PERCOCET/ROXICET) 5-325 MG tablet Take 1 tablet by mouth every 6 (six) hours as needed for moderate pain or severe pain.  11/22/14  Yes [provider]  polyethylene glycol powder (GLYCOLAX/MIRALAX) powder Take 17 g by mouth daily.  09/09/16  Yes [provider]  predniSONE (DELTASONE) 20 MG tablet Take 2 tablets (40 mg total) by mouth daily. 09/19/16 09/26/16 Yes Noemi Chapel, MD  prochlorperazine (COMPAZINE) 10 MG tablet TAKE ONE TABLET BY MOUTH EVERY 6 HOURS AS NEEDED FOR NAUSEA 09/11/16  Yes [provider]    Family History No family history on file.  Social History Social History  Substance Use Topics  . Smoking status: Never Smoker  . Smokeless tobacco: Never Used  . Alcohol use No     Allergies   Patient has no known allergies.   Review of Systems Review of Systems  Constitutional: Negative for fever.  Respiratory: Negative for shortness of breath.   Cardiovascular: Negative for chest pain.     Physical Exam Updated Vital Signs BP (!) 143/89  Pulse 69   Temp 98.2 F (36.8 C)   Resp 18   Ht 6\' 2"  (1.88 m)   Wt 107.5 kg (237 lb)   SpO2 97%   BMI 30.43 kg/m   Physical Exam  Constitutional: He appears well-developed.  Cardiovascular: Normal rate.   Pulmonary/Chest: Effort normal.  Port-A-Cath to right chest wall     ED Treatments / Results  Labs (all labs ordered are listed, but only abnormal results are displayed) Labs Reviewed - No data to display  EKG  EKG Interpretation None       Radiology No results found.  Procedures Procedures (including critical care time)  Medications Ordered in ED Medications - No data to  display   Initial Impression / Assessment and Plan / ED Course  I have reviewed the triage vital signs and the nursing notes.  Pertinent labs & imaging results that were available during my care of the patient were reviewed by me and considered in my medical decision making (see chart for details).     Patient with malfunctioning of his chemotherapy pump. Discussed with [facility] oncology fellow. D access port. Call nurse navigator tomorrow. Follow-up on Monday as planned.  Final Clinical Impressions(s) / ED Diagnoses   Final diagnoses:  Medical device associated with adverse incidents    New Prescriptions Discharge Medication List as of 09/26/2016 10:24 PM       Davonna Belling, MD 09/26/16 [id number]

## 2016-09-26 NOTE — ED Triage Notes (Signed)
Pt states the chemo machine is malfunctioning and needs to be disconnected and oncology consulted.

## 2016-09-26 NOTE — Discharge Instructions (Signed)
Call your nurse navigator tomorrow.

## 2016-10-28 ENCOUNTER — Emergency Department (HOSPITAL_COMMUNITY)
Admission: EM | Admit: 2016-10-28 | Discharge: 2016-10-28 | Disposition: A | Discharge status: Home / Self Care | Payer: Medicare Other | Attending: Emergency Medicine | Admitting: Emergency Medicine

## 2016-10-28 ENCOUNTER — Encounter (HOSPITAL_COMMUNITY): Payer: Self-pay | Admitting: *Deleted

## 2016-10-28 DIAGNOSIS — Z85038 Personal history of other malignant neoplasm of large intestine: Secondary | ICD-10-CM | POA: Insufficient documentation

## 2016-10-28 DIAGNOSIS — I10 Essential (primary) hypertension: Secondary | ICD-10-CM | POA: Diagnosis not present

## 2016-10-28 DIAGNOSIS — Z7982 Long term (current) use of aspirin: Secondary | ICD-10-CM | POA: Insufficient documentation

## 2016-10-28 DIAGNOSIS — L237 Allergic contact dermatitis due to plants, except food: Secondary | ICD-10-CM | POA: Diagnosis not present

## 2016-10-28 DIAGNOSIS — Z79899 Other long term (current) drug therapy: Secondary | ICD-10-CM | POA: Insufficient documentation

## 2016-10-28 DIAGNOSIS — R21 Rash and other nonspecific skin eruption: Secondary | ICD-10-CM | POA: Diagnosis present

## 2016-10-28 MED ORDER — HYDROXYZINE HCL 25 MG PO TABS: 25.0000 mg | ORAL_TABLET | Freq: Four times a day (QID) | ORAL | 0 refills | Status: DC | PRN

## 2016-10-28 MED ORDER — DEXAMETHASONE SODIUM PHOSPHATE 10 MG/ML IJ SOLN
10.0000 mg | Freq: Once | INTRAMUSCULAR | Status: AC
  Administered 2016-10-28: 10 mg via INTRAMUSCULAR
  Filled 2016-10-28: qty 1

## 2016-10-28 NOTE — ED Provider Notes (Signed)
[facility] DEPT Provider Note   CSN: [id number] Arrival date & time: 10/28/16  2130     History   Chief Complaint Chief Complaint  Patient presents with  . Rash    HPI Joseph Pruitt is a 51 y.o. male.  Patient presents to the ER for evaluation of itchy rash. Patient reports rash on his arms and across his chest and neck. Rash began after he cleaned some weeds in bushes from around a garage. It is similar to when he has had poison oak in the past. No tongue swelling, throat swelling or difficulty breathing.      Past Medical History:  Diagnosis Date  . Cancer [facility])    colon cancer  . Environmental allergies   . High cholesterol   . Hypertension   . Stomach ulcer     Patient Active Problem List   Diagnosis Date Noted  . Head ache 09/21/2013  . Pain in joint, ankle and foot 01/20/2013  . Left leg weakness 01/20/2013  . CARPAL TUNNEL SYNDROME, HX OF 02/14/2009  . MONONEURITIS OF UNSPECIFIED SITE 11/22/2008  . 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 TENDINITIS, RIGHT WRIST 11/22/2008  . NECK PAIN 08/14/2008    Past Surgical History:  Procedure Laterality Date  . ACHILLES TENDON SURGERY    . HAND SURGERY    . SPINE SURGERY         Home Medications    Prior to Admission medications   Medication Sig Start Date End Date Taking? Authorizing Provider  aspirin 81 MG chewable tablet Chew 81 mg by mouth daily.    Yes [provider]  fluorouracil in sodium chloride 0.9 % 50 mL Inject into the vein every 14 (fourteen) days.    Yes [provider]  hydrochlorothiazide (HYDRODIURIL) 25 MG tablet Take 25 mg by mouth daily.   Yes [provider]  ondansetron (ZOFRAN) 8 MG tablet Take 8 mg by mouth every 8 (eight) hours as needed for nausea or vomiting.  09/11/16  Yes [provider]  oxyCODONE-acetaminophen (PERCOCET/ROXICET) 5-325 MG tablet Take 1 tablet by mouth every 6 (six) hours as needed for moderate pain or  severe pain.  11/22/14  Yes [provider]  polyethylene glycol powder (GLYCOLAX/MIRALAX) powder Take 17 g by mouth daily.  09/09/16  Yes [provider]  prochlorperazine (COMPAZINE) 10 MG tablet TAKE ONE TABLET BY MOUTH EVERY 6 HOURS AS NEEDED FOR NAUSEA 09/11/16  Yes [provider]  fluticasone (FLONASE) 50 MCG/ACT nasal spray 1 spray by Each Nare route daily as needed for allergies 10/05/14 09/26/16  [provider]  hydrOXYzine (ATARAX/VISTARIL) 25 MG tablet Take 1 tablet (25 mg total) by mouth every 6 (six) hours as needed for itching. 10/28/16   Lyndsie Wallman, Gwenyth Allegra, MD    Family History No family history on file.  Social History Social History  Substance Use Topics  . Smoking status: Never Smoker  . Smokeless tobacco: Never Used  . Alcohol use No     Allergies   Patient has no known allergies.   Review of Systems Review of Systems  Skin: Positive for rash.  All other systems reviewed and are negative.    Physical Exam Updated Vital Signs BP (!) 113/91 (BP Location: Right Arm)   Pulse 73   Temp 97.7 F (36.5 C) (Oral)   Resp 17   Ht 6\' 1"  (1.854 m)   Wt 108.9 kg (240 lb)   SpO2 98%   BMI 31.66 kg/m  Physical Exam  Constitutional: He is oriented to person, place, and time. He appears well-developed and well-nourished. No distress.  HENT:  Head: Normocephalic and atraumatic.  Right Ear: Hearing normal.  Left Ear: Hearing normal.  Nose: Nose normal.  Mouth/Throat: Oropharynx is clear and moist and mucous membranes are normal.  Eyes: Pupils are equal, round, and reactive to light. Conjunctivae and EOM are normal.  Neck: Normal range of motion. Neck supple.  Cardiovascular: Regular rhythm, S1 normal and S2 normal.  Exam reveals no gallop and no friction rub.   No murmur heard. Pulmonary/Chest: Effort normal and breath sounds normal. No respiratory distress. He exhibits no tenderness.  Abdominal: Soft. Normal appearance and  bowel sounds are normal. There is no hepatosplenomegaly. There is no tenderness. There is no rebound, no guarding, no tenderness at McBurney's point and negative Murphy's sign. No hernia.  Musculoskeletal: Normal range of motion.  Neurological: He is alert and oriented to person, place, and time. He has normal strength. No cranial nerve deficit or sensory deficit. Coordination normal. GCS eye subscore is 4. GCS verbal subscore is 5. GCS motor subscore is 6.  Skin: Skin is warm, dry and intact. No rash noted. No cyanosis.  Scattered slightly raised rash on lower aspect of both arms, upper chest and neck. No vesicles. No erythema or induration.  Psychiatric: He has a normal mood and affect. His speech is normal and behavior is normal. Thought content normal.  Nursing note and vitals reviewed.    ED Treatments / Results  Labs (all labs ordered are listed, but only abnormal results are displayed) Labs Reviewed - No data to display  EKG  EKG Interpretation None       Radiology No results found.  Procedures Procedures (including critical care time)  Medications Ordered in ED Medications  dexamethasone (DECADRON) injection 10 mg (not administered)     Initial Impression / Assessment and Plan / ED Course  I have reviewed the triage vital signs and the nursing notes.  Pertinent labs & imaging results that were available during my care of the patient were reviewed by me and considered in my medical decision making (see chart for details).     Presents with rash that is consistent with contact dermatitis after working outside. Patient does have a history of shingles, but this does not resemble shingles, is bilateral and symmetric. No evidence of bacterial skin infection on examination. Treat with Decadron and Vistaril.  Final Clinical Impressions(s) / ED Diagnoses   Final diagnoses:  Allergic contact dermatitis due to plants, except food    New Prescriptions New Prescriptions     HYDROXYZINE (ATARAX/VISTARIL) 25 MG TABLET    Take 1 tablet (25 mg total) by mouth every 6 (six) hours as needed for itching.     Orpah Greek, MD 10/28/16 [phone]

## 2016-10-28 NOTE — ED Notes (Signed)
Pt ambulatory to waiting room. Pt verbalized understanding of discharge instructions.   

## 2016-10-28 NOTE — ED Triage Notes (Signed)
Pt states he got into poison oak again.

## 2017-05-04 ENCOUNTER — Encounter (HOSPITAL_COMMUNITY): Payer: Self-pay | Admitting: *Deleted

## 2017-05-04 ENCOUNTER — Emergency Department (HOSPITAL_COMMUNITY): Payer: Medicare Other

## 2017-05-04 ENCOUNTER — Emergency Department (HOSPITAL_COMMUNITY)
Admission: EM | Admit: 2017-05-04 | Discharge: 2017-05-04 | Disposition: A | Discharge status: Home / Self Care | Payer: Medicare Other | Attending: Emergency Medicine | Admitting: Emergency Medicine

## 2017-05-04 ENCOUNTER — Other Ambulatory Visit: Payer: Self-pay

## 2017-05-04 DIAGNOSIS — Z79899 Other long term (current) drug therapy: Secondary | ICD-10-CM | POA: Diagnosis not present

## 2017-05-04 DIAGNOSIS — I10 Essential (primary) hypertension: Secondary | ICD-10-CM | POA: Insufficient documentation

## 2017-05-04 DIAGNOSIS — M7989 Other specified soft tissue disorders: Secondary | ICD-10-CM

## 2017-05-04 DIAGNOSIS — Z7982 Long term (current) use of aspirin: Secondary | ICD-10-CM | POA: Insufficient documentation

## 2017-05-04 DIAGNOSIS — R2241 Localized swelling, mass and lump, right lower limb: Secondary | ICD-10-CM | POA: Insufficient documentation

## 2017-05-04 LAB — CBC WITH DIFFERENTIAL/PLATELET
BASOS PCT: 1 %
Basophils Absolute: 0 10*3/uL (ref 0.0–0.1)
Eosinophils Absolute: 0.1 10*3/uL (ref 0.0–0.7)
Eosinophils Relative: 4 %
HEMATOCRIT: 41.2 % (ref 39.0–52.0)
HEMOGLOBIN: 13.7 g/dL (ref 13.0–17.0)
Lymphocytes Relative: 55 %
Lymphs Abs: 2.2 10*3/uL (ref 0.7–4.0)
MCH: 31.6 pg (ref 26.0–34.0)
MCHC: 33.3 g/dL (ref 30.0–36.0)
MCV: 95.2 fL (ref 78.0–100.0)
MONOS PCT: 11 %
Monocytes Absolute: 0.4 10*3/uL (ref 0.1–1.0)
NEUTROS ABS: 1.1 10*3/uL — AB (ref 1.7–7.7)
NEUTROS PCT: 29 %
Platelets: 192 10*3/uL (ref 150–400)
RBC: 4.33 MIL/uL (ref 4.22–5.81)
RDW: 12.7 % (ref 11.5–15.5)
WBC: 3.9 10*3/uL — AB (ref 4.0–10.5)

## 2017-05-04 LAB — COMPREHENSIVE METABOLIC PANEL
ALBUMIN: 3.7 g/dL (ref 3.5–5.0)
ALT: 22 U/L (ref 17–63)
AST: 23 U/L (ref 15–41)
Alkaline Phosphatase: 90 U/L (ref 38–126)
Anion gap: 10 (ref 5–15)
BILIRUBIN TOTAL: 0.6 mg/dL (ref 0.3–1.2)
BUN: 20 mg/dL (ref 6–20)
CALCIUM: 9.3 mg/dL (ref 8.9–10.3)
CO2: 27 mmol/L (ref 22–32)
Chloride: 103 mmol/L (ref 101–111)
Creatinine, Ser: 1.05 mg/dL (ref 0.61–1.24)
GFR calc Af Amer: >60 mL/min (ref >60)
GFR calc non Af Amer: >60 mL/min (ref >60)
GLUCOSE: 145 mg/dL — AB (ref 65–99)
Potassium: 3.6 mmol/L (ref 3.5–5.1)
SODIUM: 140 mmol/L (ref 135–145)
Total Protein: 7.7 g/dL (ref 6.5–8.1)

## 2017-05-04 LAB — URIC ACID: Uric Acid, Serum: 8.1 mg/dL — ABNORMAL HIGH (ref 4.4–7.6)

## 2017-05-04 MED ORDER — DOXYCYCLINE HYCLATE 100 MG PO CAPS: 100.0000 mg | ORAL_CAPSULE | Freq: Two times a day (BID) | ORAL | 0 refills | Status: DC

## 2017-05-04 MED ORDER — CLOTRIMAZOLE 1 % EX CREA: TOPICAL_CREAM | CUTANEOUS | 0 refills | Status: DC

## 2017-05-04 MED ORDER — NAPROXEN 500 MG PO TABS: 500.0000 mg | ORAL_TABLET | Freq: Two times a day (BID) | ORAL | 0 refills | Status: DC

## 2017-05-04 MED ORDER — ENOXAPARIN SODIUM 120 MG/0.8ML ~~LOC~~ SOLN
1.0000 mg/kg | Freq: Once | SUBCUTANEOUS | Status: AC
  Administered 2017-05-04: 120 mg via SUBCUTANEOUS
  Filled 2017-05-04: qty 0.8

## 2017-05-04 NOTE — Discharge Instructions (Signed)
Follow-up for an ultrasound of your leg tomorrow.  Take the antibiotics as prescribed.  Use the antifungal cream as prescribed.  Return to the ED with worsening pain, swelling, fever or any other concerns.

## 2017-05-04 NOTE — ED Triage Notes (Signed)
Pt c/o right foot swelling x 2 days; pt states it is painful to touch his foot

## 2017-05-04 NOTE — ED Provider Notes (Signed)
[facility] EMERGENCY DEPARTMENT Provider Note   CSN: [id number] Arrival date & time: 05/04/17  0053     History   Chief Complaint Chief Complaint  Patient presents with  . Leg Swelling    HPI Joseph Pruitt is a 52 y.o. male.  Patient presents with 2-day history of right foot pain and swelling to the dorsum of his right foot.  Denies any injury.  Denies any fever, chills, nausea or vomiting.  Denies having similar pain in the past.  Pain is at the top of his foot and does not radiate.  No chest pain or shortness of breath.  Patient has chronic tingling and numbness in his bilateral arms and told was due to his chemotherapy.  He has been off of chemotherapy for about 3 months after receiving treatment for colon cancer.   The history is provided by the patient.    Past Medical History:  Diagnosis Date  . Cancer [facility])    colon cancer  . Environmental allergies   . High cholesterol   . Hypertension   . Stomach ulcer     Patient Active Problem List   Diagnosis Date Noted  . Head ache 09/21/2013  . Pain in joint, ankle and foot 01/20/2013  . Left leg weakness 01/20/2013  . CARPAL TUNNEL SYNDROME, HX OF 02/14/2009  . MONONEURITIS OF UNSPECIFIED SITE 11/22/2008  . 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 TENDINITIS, RIGHT WRIST 11/22/2008  . NECK PAIN 08/14/2008    Past Surgical History:  Procedure Laterality Date  . ACHILLES TENDON SURGERY    . COLON RESECTION    . HAND SURGERY    . SPINE SURGERY         Home Medications    Prior to Admission medications   Medication Sig Start Date End Date Taking? Authorizing Provider  aspirin 81 MG chewable tablet Chew 81 mg by mouth daily.     [provider]  fluorouracil in sodium chloride 0.9 % 50 mL Inject into the vein every 14 (fourteen) days.     [provider]  fluticasone Asencion Islam) 50 MCG/ACT nasal spray 1 spray by Each Nare route daily as needed for allergies 10/05/14 09/26/16   [provider]  hydrochlorothiazide (HYDRODIURIL) 25 MG tablet Take 25 mg by mouth daily.    [provider]  hydrOXYzine (ATARAX/VISTARIL) 25 MG tablet Take 1 tablet (25 mg total) by mouth every 6 (six) hours as needed for itching. 10/28/16   Pollina, Gwenyth Allegra, MD  ondansetron (ZOFRAN) 8 MG tablet Take 8 mg by mouth every 8 (eight) hours as needed for nausea or vomiting.  09/11/16   [provider]  oxyCODONE-acetaminophen (PERCOCET/ROXICET) 5-325 MG tablet Take 1 tablet by mouth every 6 (six) hours as needed for moderate pain or severe pain.  11/22/14   [provider]  polyethylene glycol powder (GLYCOLAX/MIRALAX) powder Take 17 g by mouth daily.  09/09/16   [provider]  prochlorperazine (COMPAZINE) 10 MG tablet TAKE ONE TABLET BY MOUTH EVERY 6 HOURS AS NEEDED FOR NAUSEA 09/11/16   [provider]  atorvastatin (LIPITOR) 40 MG tablet Take 1 tablet by mouth daily. 04/14/14 12/11/14  [provider]  diphenhydrAMINE (BENADRYL) 25 MG tablet Take 2 tablets (50 mg total) by mouth every 4 (four) hours as needed for itching. Patient not taking: Reported on 12/11/2014 10/16/14 12/11/14  Ripley Fraise, MD    Family History History reviewed. No pertinent family history.  Social History Social History  Tobacco Use  . Smoking status: Never Smoker  . Smokeless tobacco: Never Used  Substance Use Topics  . Alcohol use: No  . Drug use: No     Allergies   Patient has no known allergies.   Review of Systems Review of Systems  Constitutional: Negative for activity change, appetite change and fever.  HENT: Negative for congestion.   Eyes: Negative for visual disturbance.  Respiratory: Negative for cough, chest tightness and shortness of breath.   Cardiovascular: Negative for chest pain.  Gastrointestinal: Negative for abdominal pain, nausea and vomiting.  Genitourinary: Negative for dysuria, hematuria and urgency.  Musculoskeletal:  Negative for arthralgias, back pain and myalgias.  Skin: Positive for rash and wound.  Neurological: Negative for dizziness, weakness and headaches.   all other systems are negative except as noted in the HPI and PMH.     Physical Exam Updated Vital Signs BP 128/87   Pulse 67   Temp 97.8 F (36.6 C) (Oral)   Resp 18   Ht 6\' 1"  (1.854 m)   Wt 117.9 kg (260 lb)   SpO2 100%   BMI 34.30 kg/m   Physical Exam  Constitutional: He is oriented to person, place, and time. He appears well-developed and well-nourished. No distress.  HENT:  Head: Normocephalic and atraumatic.  Mouth/Throat: Oropharynx is clear and moist. No oropharyngeal exudate.  Eyes: Conjunctivae and EOM are normal. Pupils are equal, round, and reactive to light.  Neck: Normal range of motion. Neck supple.  No meningismus.  Cardiovascular: Normal rate, regular rhythm, normal heart sounds and intact distal pulses.  No murmur heard. Pulmonary/Chest: Effort normal and breath sounds normal. No respiratory distress.  Abdominal: Soft. There is no tenderness. There is no rebound and no guarding.  Musculoskeletal: Normal range of motion. He exhibits edema and tenderness.  Mild tenderness and edema to the dorsal right foot.  Worse over the third and fourth MTP joints.  There is maceration between toes.  Minimal questionable erythema to top of foot.  Intact DP and PT pulses No Calf asymmetry or swelling.  Neurological: He is alert and oriented to person, place, and time. No cranial nerve deficit. He exhibits normal muscle tone. Coordination normal.  No ataxia on finger to nose bilaterally. No pronator drift. 5/5 strength throughout. CN 2-12 intact.Equal grip strength. Sensation intact.   Skin: Skin is warm.  Psychiatric: He has a normal mood and affect. His behavior is normal.  Nursing note and vitals reviewed.    ED Treatments / Results  Labs (all labs ordered are listed, but only abnormal results are displayed) Labs  Reviewed  CBC WITH DIFFERENTIAL/PLATELET - Abnormal; Notable for the following components:      Result Value   WBC 3.9 (*)    Neutro Abs 1.1 (*)    All other components within normal limits  COMPREHENSIVE METABOLIC PANEL - Abnormal; Notable for the following components:   Glucose, Bld 145 (*)    All other components within normal limits  URIC ACID - Abnormal; Notable for the following components:   Uric Acid, Serum 8.1 (*)    All other components within normal limits    EKG  EKG Interpretation None       Radiology Dg Foot Complete Right  Result Date: 05/04/2017 CLINICAL DATA:  52 year old male with right foot pain and swelling x2 days. EXAM: RIGHT FOOT COMPLETE - 3+ VIEW COMPARISON:  Right foot radiograph dated 07/10/2004 FINDINGS: There is no acute fracture or dislocation. There is mild hallux  valgus. Degenerative changes with subcortical erosion of the medial head of the first metatarsal may be related to gout. Clinical correlation is recommended. Osteophyte noted along the dorsal tarsometatarsal joints. Mild soft tissue swelling of the forefoot. No radiopaque foreign object. IMPRESSION: 1. No acute fracture or dislocation. 2. Mild hallux valgus and erosive changes of the medial first metatarsal head. Electronically Signed   By: Anner Crete M.D.   On: 05/04/2017 02:55    Procedures Procedures (including critical care time)  Medications Ordered in ED Medications  enoxaparin (LOVENOX) injection 120 mg (not administered)     Initial Impression / Assessment and Plan / ED Course  I have reviewed the triage vital signs and the nursing notes.  Pertinent labs & imaging results that were available during my care of the patient were reviewed by me and considered in my medical decision making (see chart for details).    Patient with history of colon cancer presenting with atraumatic right foot pain.  There is evidence of tinea pedis and possibly early cellulitis of dorsal foot.   Neurovascularly intact. Lower suspicion for DVT but this is still possible.  Unable to obtain ultrasound at this time.  X-ray does not show any acute trauma.  Labs are reassuring without leukocytosis.  Will treat for possible cellulitis as well as fungal infection.  Schedule ultrasound for tomorrow.  Uric acid minimally elevated. Could also represent gout though pain is in an atypical location.  No evidence of septic joint or sepsis at this time. Patient will be treated supportively with pain control, antibiotics, NSAIDs, and antifungals.  Return tomorrow for ultrasound to rule out DVT.  Lovenox dose given. Return precautions discussed.  Final Clinical Impressions(s) / ED Diagnoses   Final diagnoses:  Swelling of right foot    ED Discharge Orders    None       Beau Vanduzer, Annie Main, MD 05/04/17 [phone]

## 2017-07-27 ENCOUNTER — Emergency Department (HOSPITAL_COMMUNITY)
Admission: EM | Admit: 2017-07-27 | Discharge: 2017-07-27 | Disposition: A | Discharge status: Home / Self Care | Payer: No Typology Code available for payment source | Attending: Emergency Medicine | Admitting: Emergency Medicine

## 2017-07-27 ENCOUNTER — Encounter (HOSPITAL_COMMUNITY): Payer: Self-pay | Admitting: *Deleted

## 2017-07-27 ENCOUNTER — Emergency Department (HOSPITAL_COMMUNITY): Payer: No Typology Code available for payment source

## 2017-07-27 ENCOUNTER — Other Ambulatory Visit: Payer: Self-pay

## 2017-07-27 DIAGNOSIS — Y939 Activity, unspecified: Secondary | ICD-10-CM | POA: Insufficient documentation

## 2017-07-27 DIAGNOSIS — Y999 Unspecified external cause status: Secondary | ICD-10-CM | POA: Insufficient documentation

## 2017-07-27 DIAGNOSIS — Y92410 Unspecified street and highway as the place of occurrence of the external cause: Secondary | ICD-10-CM | POA: Diagnosis not present

## 2017-07-27 DIAGNOSIS — Z79899 Other long term (current) drug therapy: Secondary | ICD-10-CM | POA: Insufficient documentation

## 2017-07-27 DIAGNOSIS — S199XXA Unspecified injury of neck, initial encounter: Secondary | ICD-10-CM | POA: Diagnosis present

## 2017-07-27 DIAGNOSIS — S161XXA Strain of muscle, fascia and tendon at neck level, initial encounter: Secondary | ICD-10-CM | POA: Diagnosis not present

## 2017-07-27 DIAGNOSIS — I10 Essential (primary) hypertension: Secondary | ICD-10-CM | POA: Insufficient documentation

## 2017-07-27 DIAGNOSIS — Z7982 Long term (current) use of aspirin: Secondary | ICD-10-CM | POA: Diagnosis not present

## 2017-07-27 MED ORDER — TRAMADOL HCL 50 MG PO TABS: 50.0000 mg | ORAL_TABLET | Freq: Four times a day (QID) | ORAL | 0 refills | Status: DC | PRN

## 2017-07-27 MED ORDER — NAPROXEN 500 MG PO TABS: 500.0000 mg | ORAL_TABLET | Freq: Two times a day (BID) | ORAL | 0 refills | Status: DC

## 2017-07-27 MED ORDER — METHOCARBAMOL 500 MG PO TABS: 500.0000 mg | ORAL_TABLET | Freq: Three times a day (TID) | ORAL | 0 refills | Status: DC | PRN
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
## 2017-07-27 NOTE — Discharge Instructions (Addendum)
Robaxin for muscle spasms. Naproxen for mild pain, Ultram for moderate to severe pain.

## 2017-08-01 ENCOUNTER — Encounter (HOSPITAL_COMMUNITY): Payer: Self-pay | Admitting: Emergency Medicine

## 2017-08-01 ENCOUNTER — Emergency Department (HOSPITAL_COMMUNITY)
Admission: EM | Admit: 2017-08-01 | Discharge: 2017-08-01 | Disposition: A | Discharge status: Home / Self Care | Payer: Medicare Other | Attending: Emergency Medicine | Admitting: Emergency Medicine

## 2017-08-01 ENCOUNTER — Other Ambulatory Visit: Payer: Self-pay

## 2017-08-01 DIAGNOSIS — Z79899 Other long term (current) drug therapy: Secondary | ICD-10-CM | POA: Diagnosis not present

## 2017-08-01 DIAGNOSIS — I10 Essential (primary) hypertension: Secondary | ICD-10-CM | POA: Diagnosis not present

## 2017-08-01 DIAGNOSIS — Z85038 Personal history of other malignant neoplasm of large intestine: Secondary | ICD-10-CM | POA: Insufficient documentation

## 2017-08-01 DIAGNOSIS — L237 Allergic contact dermatitis due to plants, except food: Secondary | ICD-10-CM | POA: Diagnosis not present

## 2017-08-01 DIAGNOSIS — Z7982 Long term (current) use of aspirin: Secondary | ICD-10-CM | POA: Diagnosis not present

## 2017-08-01 DIAGNOSIS — R21 Rash and other nonspecific skin eruption: Secondary | ICD-10-CM | POA: Diagnosis present

## 2017-08-01 MED ORDER — HYDROXYZINE HCL 25 MG PO TABS: 25.0000 mg | ORAL_TABLET | Freq: Four times a day (QID) | ORAL | 0 refills | Status: DC

## 2017-08-01 MED ORDER — METHYLPREDNISOLONE SODIUM SUCC 125 MG IJ SOLR
125.0000 mg | Freq: Once | INTRAMUSCULAR | Status: AC
  Administered 2017-08-01: 125 mg via INTRAMUSCULAR
  Filled 2017-08-01: qty 2

## 2017-08-01 MED ORDER — PREDNISONE 10 MG PO TABS: 20.0000 mg | ORAL_TABLET | Freq: Two times a day (BID) | ORAL | 0 refills | Status: DC

## 2017-08-01 NOTE — ED Provider Notes (Signed)
[facility] EMERGENCY DEPARTMENT Provider Note   CSN: [id number] Arrival date & time: 08/01/17  0106     History   Chief Complaint Chief Complaint  Patient presents with  . Rash    HPI Joseph Pruitt is a 52 y.o. male.  This patient is a 52 year old male with history of colon cancer.  He presents for evaluation of itching and rash to his extremities and face.  He states he was working in the yard cutting down branches prior to the onset of his symptoms.  Believes that he has poison ivy.  He denies any throat swelling or difficulty breathing.  The history is provided by the patient.  Rash   This is a new problem. The current episode started yesterday. The problem has been rapidly worsening. The problem is associated with plant contact. There has been no fever.    Past Medical History:  Diagnosis Date  . Cancer [facility])    colon cancer  . Environmental allergies   . High cholesterol   . Hypertension   . Stomach ulcer     Patient Active Problem List   Diagnosis Date Noted  . Head ache 09/21/2013  . Pain in joint, ankle and foot 01/20/2013  . Left leg weakness 01/20/2013  . CARPAL TUNNEL SYNDROME, HX OF 02/14/2009  . MONONEURITIS OF UNSPECIFIED SITE 11/22/2008  . 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 TENDINITIS, RIGHT WRIST 11/22/2008  . NECK PAIN 08/14/2008    Past Surgical History:  Procedure Laterality Date  . ACHILLES TENDON SURGERY    . COLON RESECTION    . HAND SURGERY    . SPINE SURGERY          Home Medications    Prior to Admission medications   Medication Sig Start Date End Date Taking? Authorizing Provider  aspirin 81 MG chewable tablet Chew 81 mg by mouth daily.     [provider]  clotrimazole (LOTRIMIN) 1 % cream Apply to affected area 2 times daily 05/04/17   Rancour, Annie Main, MD  doxycycline (VIBRAMYCIN) 100 MG capsule Take 1 capsule (100 mg total) by mouth 2 (two) times daily. 05/04/17   Rancour, Annie Main, MD    fluorouracil in sodium chloride 0.9 % 50 mL Inject into the vein every 14 (fourteen) days.     [provider]  fluticasone Asencion Islam) 50 MCG/ACT nasal spray 1 spray by Each Nare route daily as needed for allergies 10/05/14 09/26/16  [provider]  hydrochlorothiazide (HYDRODIURIL) 25 MG tablet Take 25 mg by mouth daily.    [provider]  hydrOXYzine (ATARAX/VISTARIL) 25 MG tablet Take 1 tablet (25 mg total) by mouth every 6 (six) hours as needed for itching. 10/28/16   Pollina, Gwenyth Allegra, MD  methocarbamol (ROBAXIN) 500 MG tablet Take 1 tablet (500 mg total) by mouth 3 (three) times daily between meals as needed. 07/27/17   Tanna Furry, MD  naproxen (NAPROSYN) 500 MG tablet Take 1 tablet (500 mg total) by mouth 2 (two) times daily. 07/27/17   Tanna Furry, MD  ondansetron (ZOFRAN) 8 MG tablet Take 8 mg by mouth every 8 (eight) hours as needed for nausea or vomiting.  09/11/16   [provider]  oxyCODONE-acetaminophen (PERCOCET/ROXICET) 5-325 MG tablet Take 1 tablet by mouth every 6 (six) hours as needed for moderate pain or severe pain.  11/22/14   [provider]  polyethylene glycol powder (GLYCOLAX/MIRALAX) powder Take 17 g by mouth daily.  09/09/16   [provider]  prochlorperazine (COMPAZINE) 10 MG tablet TAKE ONE TABLET BY MOUTH EVERY 6 HOURS AS NEEDED FOR NAUSEA 09/11/16   [provider]  traMADol (ULTRAM) 50 MG tablet Take 1 tablet (50 mg total) by mouth every 6 (six) hours as needed. 07/27/17   Tanna Furry, MD    Family History No family history on file.  Social History Social History   Tobacco Use  . Smoking status: Never Smoker  . Smokeless tobacco: Never Used  Substance Use Topics  . Alcohol use: No  . Drug use: No     Allergies   Patient has no known allergies.   Review of Systems Review of Systems  Skin: Positive for rash.  All other systems reviewed and are negative.    Physical Exam Updated Vital  Signs BP 138/86 (BP Location: Right Arm)   Pulse 70   Temp 97.7 F (36.5 C) (Oral)   Resp 20   Ht 6\' 1"  (1.854 m)   Wt 117.9 kg (260 lb)   SpO2 96%   BMI 34.30 kg/m   Physical Exam  Constitutional: He is oriented to person, place, and time. He appears well-developed and well-nourished. No distress.  HENT:  Head: Normocephalic and atraumatic.  Neck: Normal range of motion. Neck supple.  Pulmonary/Chest: Effort normal.  Musculoskeletal:  There is a macular rash to the arms and legs and face.  Neurological: He is alert and oriented to person, place, and time.  Skin: Skin is warm and dry. He is not diaphoretic.  Nursing note and vitals reviewed.    ED Treatments / Results  Labs (all labs ordered are listed, but only abnormal results are displayed) Labs Reviewed - No data to display  EKG None  Radiology No results found.  Procedures Procedures (including critical care time)  Medications Ordered in ED Medications  methylPREDNISolone sodium succinate (SOLU-MEDROL) 125 mg/2 mL injection 125 mg (has no administration in time range)     Initial Impression / Assessment and Plan / ED Course  I have reviewed the triage vital signs and the nursing notes.  Pertinent labs & imaging results that were available during my care of the patient were reviewed by me and considered in my medical decision making (see chart for details).  This appears to be contact dermatitis, most likely related to poison ivy.  He will be given Solu-Medrol charged with prednisone and hydroxyzine.  Final Clinical Impressions(s) / ED Diagnoses   Final diagnoses:  None    ED Discharge Orders    None       Veryl Speak, MD 08/01/17 [phone]

## 2017-08-01 NOTE — Discharge Instructions (Addendum)
Prednisone as prescribed.  Hydroxyzine as prescribed.  Follow-up with your primary doctor if not improving in the next week.

## 2017-08-01 NOTE — ED Triage Notes (Signed)
Pt c/o generalized itching rash x 2 days. States he thinks it may be poison ivy.

## 2017-08-05 ENCOUNTER — Other Ambulatory Visit: Payer: Self-pay

## 2017-08-05 ENCOUNTER — Encounter (HOSPITAL_COMMUNITY): Payer: Self-pay | Admitting: Emergency Medicine

## 2017-08-05 ENCOUNTER — Emergency Department (HOSPITAL_COMMUNITY)
Admission: EM | Admit: 2017-08-05 | Discharge: 2017-08-05 | Disposition: A | Discharge status: Home / Self Care | Payer: Medicare Other | Attending: Emergency Medicine | Admitting: Emergency Medicine

## 2017-08-05 DIAGNOSIS — R509 Fever, unspecified: Secondary | ICD-10-CM | POA: Diagnosis present

## 2017-08-05 DIAGNOSIS — I10 Essential (primary) hypertension: Secondary | ICD-10-CM | POA: Insufficient documentation

## 2017-08-05 DIAGNOSIS — Z79899 Other long term (current) drug therapy: Secondary | ICD-10-CM | POA: Diagnosis not present

## 2017-08-05 DIAGNOSIS — Z7982 Long term (current) use of aspirin: Secondary | ICD-10-CM | POA: Insufficient documentation

## 2017-08-05 DIAGNOSIS — J020 Streptococcal pharyngitis: Secondary | ICD-10-CM | POA: Diagnosis not present

## 2017-08-05 LAB — GROUP A STREP BY PCR: GROUP A STREP BY PCR: DETECTED — AB

## 2017-08-05 MED ORDER — PENICILLIN G BENZATHINE 1200000 UNIT/2ML IM SUSP
1.2000 10*6.[IU] | Freq: Once | INTRAMUSCULAR | Status: AC
  Administered 2017-08-05: 1.2 10*6.[IU] via INTRAMUSCULAR
  Filled 2017-08-05: qty 2

## 2017-08-05 MED ORDER — TRAMADOL HCL 50 MG PO TABS: ORAL_TABLET | ORAL | 0 refills | Status: DC

## 2017-08-05 MED ORDER — ACETAMINOPHEN 500 MG PO TABS
1000.0000 mg | ORAL_TABLET | Freq: Once | ORAL | Status: AC
  Administered 2017-08-05: 1000 mg via ORAL
  Filled 2017-08-05: qty 2

## 2017-08-05 NOTE — ED Triage Notes (Signed)
Patient complains of fever and generalized body aches x 1 day.

## 2017-08-05 NOTE — Discharge Instructions (Addendum)
Your examination suggests strep throat, or strep pharyngitis.  Please use salt water gargles 3 or 4 times daily.  Use Tylenol extra strength every 4 hours for pain mild pain, use Ultram for more severe pain.  You were treated with Bicillin here in the emergency department which should take care of the strep infection.  This is highly contagious.  Please use a mask until symptoms have resolved.  Please wash hands frequently.  Do not share eating utensils.  Please change toothbrushes after this has resolved.  Return to the emergency department if any changes in condition, problems, or concerns.

## 2017-08-05 NOTE — ED Provider Notes (Signed)
[facility] EMERGENCY DEPARTMENT Provider Note   CSN: [id number] Arrival date & time: 08/05/17  0706     History   Chief Complaint Chief Complaint  Patient presents with  . Fever    HPI Joseph Pruitt is a 52 y.o. male.  Pt has a history of colon cancer. He has been off chemo for 6 months.  The history is provided by the patient.  URI   This is a new problem. The current episode started yesterday. The problem has been gradually worsening. The maximum temperature recorded prior to his arrival was 100 to 100.9 F. Associated symptoms include abdominal pain, nausea, congestion, headaches and cough. Pertinent negatives include no chest pain, no vomiting, no dysuria, no neck pain, no rash and no wheezing. He has tried rest (tylenol) for the symptoms.    Past Medical History:  Diagnosis Date  . Cancer [facility])    colon cancer  . Environmental allergies   . High cholesterol   . Hypertension   . Stomach ulcer     Patient Active Problem List   Diagnosis Date Noted  . Head ache 09/21/2013  . Pain in joint, ankle and foot 01/20/2013  . Left leg weakness 01/20/2013  . CARPAL TUNNEL SYNDROME, HX OF 02/14/2009  . MONONEURITIS OF UNSPECIFIED SITE 11/22/2008  . 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 TENDINITIS, RIGHT WRIST 11/22/2008  . NECK PAIN 08/14/2008    Past Surgical History:  Procedure Laterality Date  . ACHILLES TENDON SURGERY    . COLON RESECTION    . HAND SURGERY    . SPINE SURGERY          Home Medications    Prior to Admission medications   Medication Sig Start Date End Date Taking? Authorizing Provider  aspirin 81 MG chewable tablet Chew 81 mg by mouth daily.     [provider]  clotrimazole (LOTRIMIN) 1 % cream Apply to affected area 2 times daily 05/04/17   Rancour, Annie Main, MD  doxycycline (VIBRAMYCIN) 100 MG capsule Take 1 capsule (100 mg total) by mouth 2 (two) times daily. 05/04/17   Rancour, Annie Main, MD  fluorouracil in sodium  chloride 0.9 % 50 mL Inject into the vein every 14 (fourteen) days.     [provider]  fluticasone Asencion Islam) 50 MCG/ACT nasal spray 1 spray by Each Nare route daily as needed for allergies 10/05/14 09/26/16  [provider]  hydrochlorothiazide (HYDRODIURIL) 25 MG tablet Take 25 mg by mouth daily.    [provider]  hydrOXYzine (ATARAX/VISTARIL) 25 MG tablet Take 1 tablet (25 mg total) by mouth every 6 (six) hours. 08/01/17   Veryl Speak, MD  methocarbamol (ROBAXIN) 500 MG tablet Take 1 tablet (500 mg total) by mouth 3 (three) times daily between meals as needed. 07/27/17   Tanna Furry, MD  naproxen (NAPROSYN) 500 MG tablet Take 1 tablet (500 mg total) by mouth 2 (two) times daily. 07/27/17   Tanna Furry, MD  ondansetron (ZOFRAN) 8 MG tablet Take 8 mg by mouth every 8 (eight) hours as needed for nausea or vomiting.  09/11/16   [provider]  oxyCODONE-acetaminophen (PERCOCET/ROXICET) 5-325 MG tablet Take 1 tablet by mouth every 6 (six) hours as needed for moderate pain or severe pain.  11/22/14   [provider]  polyethylene glycol powder (GLYCOLAX/MIRALAX) powder Take 17 g by mouth daily.  09/09/16   [provider]  predniSONE (DELTASONE) 10 MG tablet Take 2 tablets (20 mg total) by mouth  2 (two) times daily. 08/01/17   Veryl Speak, MD  prochlorperazine (COMPAZINE) 10 MG tablet TAKE ONE TABLET BY MOUTH EVERY 6 HOURS AS NEEDED FOR NAUSEA 09/11/16   [provider]  traMADol (ULTRAM) 50 MG tablet Take 1 tablet (50 mg total) by mouth every 6 (six) hours as needed. 07/27/17   Tanna Furry, MD    Family History No family history on file.  Social History Social History   Tobacco Use  . Smoking status: Never Smoker  . Smokeless tobacco: Never Used  Substance Use Topics  . Alcohol use: No  . Drug use: No     Allergies   Patient has no known allergies.   Review of Systems Review of Systems  Constitutional: Positive for activity  change and appetite change.       All ROS Neg except as noted in HPI  HENT: Positive for congestion. Negative for nosebleeds.   Eyes: Negative for photophobia and discharge.  Respiratory: Positive for cough. Negative for shortness of breath and wheezing.   Cardiovascular: Negative for chest pain and palpitations.  Gastrointestinal: Positive for abdominal pain and nausea. Negative for blood in stool and vomiting.  Genitourinary: Negative for dysuria, frequency and hematuria.  Musculoskeletal: Negative for arthralgias, back pain and neck pain.  Skin: Negative.  Negative for rash.  Neurological: Positive for headaches. Negative for dizziness, seizures and speech difficulty.  Psychiatric/Behavioral: Negative for confusion and hallucinations.     Physical Exam Updated Vital Signs BP 123/87 (BP Location: Left Arm)   Pulse 85   Temp 98.1 F (36.7 C) (Oral)   Resp 16   Ht 6\' 1"  (1.854 m)   Wt 117.9 kg (260 lb)   SpO2 95%   BMI 34.30 kg/m   Physical Exam  Constitutional: He is oriented to person, place, and time. He appears well-developed and well-nourished.  Non-toxic appearance.  HENT:  Head: Normocephalic.  Right Ear: Tympanic membrane and external ear normal.  Left Ear: Tympanic membrane and external ear normal.  Mouth/Throat: Uvula is midline. No trismus in the jaw. Uvula swelling present. Oropharyngeal exudate and posterior oropharyngeal erythema present. No tonsillar abscesses.  Eyes: Pupils are equal, round, and reactive to light. EOM and lids are normal.  Neck: Normal range of motion. Neck supple. Carotid bruit is not present.  Cardiovascular: Normal rate, regular rhythm, normal heart sounds, intact distal pulses and normal pulses.  Pulmonary/Chest: Breath sounds normal. No respiratory distress.  Abdominal: Soft. Bowel sounds are normal. There is no tenderness. There is no guarding.  Musculoskeletal: Normal range of motion.  Lymphadenopathy:       Head (right side): No  submandibular adenopathy present.       Head (left side): No submandibular adenopathy present.    He has no cervical adenopathy.  Neurological: He is alert and oriented to person, place, and time. He has normal strength. No cranial nerve deficit or sensory deficit.  Skin: Skin is warm and dry.  Psychiatric: He has a normal mood and affect. His speech is normal.  Nursing note and vitals reviewed.    ED Treatments / Results  Labs (all labs ordered are listed, but only abnormal results are displayed) Labs Reviewed  GROUP A STREP BY PCR - Abnormal; Notable for the following components:      Result Value   Group A Strep by PCR DETECTED (*)    All other components within normal limits    EKG None  Radiology No results found.  Procedures Procedures (including critical  care time)  Medications Ordered in ED Medications - No data to display   Initial Impression / Assessment and Plan / ED Course  I have reviewed the triage vital signs and the nursing notes.  Pertinent labs & imaging results that were available during my care of the patient were reviewed by me and considered in my medical decision making (see chart for details).       Final Clinical Impressions(s) / ED Diagnoses MDM  Vital signs reviewed.  Strep test is positive.  No airway compromise on the examination.  I discussed this with the patient.  I provided a mask.  The patient has opted to receive the Bicillin injection his treatment.  He will use Tylenol every 4 hours for mild pain.  He will use Ultram for more severe pain.  He will follow-up with his primary physician if any changes or problems.   Final diagnoses:  Strep pharyngitis    ED Discharge Orders        Ordered    traMADol (ULTRAM) 50 MG tablet     08/05/17 0925       Lily Kocher, PA-C 08/05/17 [place], [place], DO 08/09/17 0725
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
## 2017-11-15 ENCOUNTER — Encounter (HOSPITAL_COMMUNITY): Payer: Self-pay | Admitting: *Deleted

## 2017-11-15 ENCOUNTER — Emergency Department (HOSPITAL_COMMUNITY)
Admission: EM | Admit: 2017-11-15 | Discharge: 2017-11-15 | Disposition: A | Discharge status: Home / Self Care | Payer: Medicare Other | Attending: Emergency Medicine | Admitting: Emergency Medicine

## 2017-11-15 ENCOUNTER — Other Ambulatory Visit: Payer: Self-pay

## 2017-11-15 DIAGNOSIS — Z85038 Personal history of other malignant neoplasm of large intestine: Secondary | ICD-10-CM | POA: Insufficient documentation

## 2017-11-15 DIAGNOSIS — R21 Rash and other nonspecific skin eruption: Secondary | ICD-10-CM | POA: Diagnosis not present

## 2017-11-15 DIAGNOSIS — Z79899 Other long term (current) drug therapy: Secondary | ICD-10-CM | POA: Diagnosis not present

## 2017-11-15 DIAGNOSIS — Z7982 Long term (current) use of aspirin: Secondary | ICD-10-CM | POA: Diagnosis not present

## 2017-11-15 DIAGNOSIS — I10 Essential (primary) hypertension: Secondary | ICD-10-CM | POA: Insufficient documentation

## 2017-11-15 MED ORDER — PREDNISONE 50 MG PO TABS: ORAL_TABLET | ORAL | 0 refills | Status: DC

## 2017-11-15 MED ORDER — DEXAMETHASONE SODIUM PHOSPHATE 10 MG/ML IJ SOLN
10.0000 mg | Freq: Once | INTRAMUSCULAR | Status: AC
  Administered 2017-11-15: 10 mg via INTRAMUSCULAR
  Filled 2017-11-15: qty 1

## 2017-11-15 MED ORDER — HYDROXYZINE HCL 25 MG PO TABS: 25.0000 mg | ORAL_TABLET | Freq: Four times a day (QID) | ORAL | 0 refills | Status: DC

## 2017-11-15 NOTE — ED Provider Notes (Signed)
[facility] EMERGENCY DEPARTMENT Provider Note   CSN: [id number] Arrival date & time: 11/15/17  0350     History   Chief Complaint Chief Complaint  Patient presents with  . Rash    HPI Joseph Pruitt is a 52 y.o. male.  The history is provided by the patient.  Rash   This is a new problem. The current episode started yesterday. The problem has been gradually worsening. The problem is associated with plant contact. There has been no fever. The pain is mild. The pain has been constant since onset. Associated symptoms include itching.   Patient reports recurrent rash likely due to poison oak.  He reports he has been working outside over the past days and itching in his arms and chest and back Denies any other complaints.  No difficulty breathing or difficulty swallowing. Past Medical History:  Diagnosis Date  . Cancer [facility])    colon cancer  . Environmental allergies   . High cholesterol   . Hypertension   . Stomach ulcer     Patient Active Problem List   Diagnosis Date Noted  . Head ache 09/21/2013  . Pain in joint, ankle and foot 01/20/2013  . Left leg weakness 01/20/2013  . CARPAL TUNNEL SYNDROME, HX OF 02/14/2009  . MONONEURITIS OF UNSPECIFIED SITE 11/22/2008  . 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 TENDINITIS, RIGHT WRIST 11/22/2008  . NECK PAIN 08/14/2008    Past Surgical History:  Procedure Laterality Date  . ACHILLES TENDON SURGERY    . COLON RESECTION    . HAND SURGERY    . SPINE SURGERY          Home Medications    Prior to Admission medications   Medication Sig Start Date End Date Taking? Authorizing Provider  hydrochlorothiazide (HYDRODIURIL) 25 MG tablet Take 25 mg by mouth daily.   Yes [provider]  oxyCODONE-acetaminophen (PERCOCET/ROXICET) 5-325 MG tablet Take 1 tablet by mouth every 6 (six) hours as needed for moderate pain or severe pain.  11/22/14  Yes [provider]  aspirin 81 MG chewable tablet Chew  81 mg by mouth daily.     [provider]  fluorouracil in sodium chloride 0.9 % 50 mL Inject into the vein every 14 (fourteen) days.     [provider]  fluticasone Asencion Islam) 50 MCG/ACT nasal spray 1 spray by Each Nare route daily as needed for allergies 10/05/14 09/26/16  [provider]  hydrOXYzine (ATARAX/VISTARIL) 25 MG tablet Take 1 tablet (25 mg total) by mouth every 6 (six) hours. 11/15/17   Ripley Fraise, MD  polyethylene glycol powder (GLYCOLAX/MIRALAX) powder Take 17 g by mouth daily.  09/09/16   [provider]  predniSONE (DELTASONE) 50 MG tablet One tablet po daily for 4 days 11/15/17   Ripley Fraise, MD    Family History No family history on file.  Social History Social History   Tobacco Use  . Smoking status: Never Smoker  . Smokeless tobacco: Never Used  Substance Use Topics  . Alcohol use: No  . Drug use: No     Allergies   Patient has no known allergies.   Review of Systems Review of Systems  Constitutional: Negative for fever.  Skin: Positive for itching and rash.     Physical Exam Updated Vital Signs BP (!) 139/95   Pulse (!) 58   Temp (!) 97.5 F (36.4 C) (Oral)   Resp 20   Ht 1.854 m (6\' 1" )  Wt 117.9 kg   SpO2 97%   BMI 34.30 kg/m   Physical Exam CONSTITUTIONAL: Well developed/well nourished HEAD: Normocephalic/atraumatic EYES: EOMI/PERRL ENMT: Mucous membranes moist, no angioedema NECK: supple no meningeal signs CV: S1/S2 noted, no murmurs/rubs/gallops noted LUNGS: Lungs are clear to auscultation bilaterally, no apparent distress ABDOMEN: soft, nontender NEURO: Pt is awake/alert/appropriate, moves all extremitiesx4.  No facial droop.   EXTREMITIES: pulses normal/equal, full ROM SKIN: Scattered rash noted to arms, and chest and abdomen PSYCH: no abnormalities of mood noted, alert and oriented to situation   ED Treatments / Results  Labs (all labs ordered are listed, but only abnormal results  are displayed) Labs Reviewed - No data to display  EKG None  Radiology No results found.  Procedures Procedures   Medications Ordered in ED Medications  dexamethasone (DECADRON) injection 10 mg (10 mg Intramuscular Given 11/15/17 0544)     Initial Impression / Assessment and Plan / ED Course  I have reviewed the triage vital signs and the nursing notes.      He reports he has had  this previously.  Will treat for contact dermatitis.  He reports "shot" of steroids usually helps.  Decadron ordered He has had good relief with hydroxyzine previously.  No other signs of allergic reaction.  He is appropriate for discharge  Final Clinical Impressions(s) / ED Diagnoses   Final diagnoses:  Rash    ED Discharge Orders         Ordered    predniSONE (DELTASONE) 50 MG tablet     11/15/17 0541    hydrOXYzine (ATARAX/VISTARIL) 25 MG tablet  Every 6 hours     11/15/17 0541           Ripley Fraise, MD 11/15/17 [phone]

## 2017-11-15 NOTE — ED Triage Notes (Signed)
Pt c/o rash to bilateral arms and legs that started yesterday after working outside,

## 2019-01-07 ENCOUNTER — Other Ambulatory Visit: Payer: Self-pay

## 2019-01-07 ENCOUNTER — Encounter (HOSPITAL_COMMUNITY): Payer: Self-pay

## 2019-01-07 ENCOUNTER — Emergency Department (HOSPITAL_COMMUNITY): Payer: Medicare Other

## 2019-01-07 ENCOUNTER — Emergency Department (HOSPITAL_COMMUNITY)
Admission: EM | Admit: 2019-01-07 | Discharge: 2019-01-08 | Disposition: A | Discharge status: Home / Self Care | Payer: Medicare Other | Attending: Emergency Medicine | Admitting: Emergency Medicine

## 2019-01-07 DIAGNOSIS — Z79899 Other long term (current) drug therapy: Secondary | ICD-10-CM | POA: Insufficient documentation

## 2019-01-07 DIAGNOSIS — I10 Essential (primary) hypertension: Secondary | ICD-10-CM | POA: Diagnosis not present

## 2019-01-07 DIAGNOSIS — Z85038 Personal history of other malignant neoplasm of large intestine: Secondary | ICD-10-CM | POA: Diagnosis not present

## 2019-01-07 DIAGNOSIS — R0789 Other chest pain: Secondary | ICD-10-CM | POA: Diagnosis present

## 2019-01-07 DIAGNOSIS — Z7982 Long term (current) use of aspirin: Secondary | ICD-10-CM | POA: Diagnosis not present

## 2019-01-07 LAB — BASIC METABOLIC PANEL
Anion gap: 8 (ref 5–15)
BUN: 13 mg/dL (ref 6–20)
CO2: 27 mmol/L (ref 22–32)
Calcium: 8.9 mg/dL (ref 8.9–10.3)
Chloride: 103 mmol/L (ref 98–111)
Creatinine, Ser: 1.16 mg/dL (ref 0.61–1.24)
GFR calc Af Amer: >60 mL/min (ref >60)
GFR calc non Af Amer: >60 mL/min (ref >60)
Glucose, Bld: 115 mg/dL — ABNORMAL HIGH (ref 70–99)
Potassium: 3.6 mmol/L (ref 3.5–5.1)
Sodium: 138 mmol/L (ref 135–145)

## 2019-01-07 LAB — CBC
HCT: 44.6 % (ref 39.0–52.0)
Hemoglobin: 14.8 g/dL (ref 13.0–17.0)
MCH: 30.7 pg (ref 26.0–34.0)
MCHC: 33.2 g/dL (ref 30.0–36.0)
MCV: 92.5 fL (ref 80.0–100.0)
Platelets: 205 10*3/uL (ref 150–400)
RBC: 4.82 MIL/uL (ref 4.22–5.81)
RDW: 12.3 % (ref 11.5–15.5)
WBC: 4.5 10*3/uL (ref 4.0–10.5)
nRBC: 0 % (ref 0.0–0.2)

## 2019-01-07 LAB — TROPONIN I (HIGH SENSITIVITY): Troponin I (High Sensitivity): 3 ng/L (ref <18)

## 2019-01-07 MED ORDER — SODIUM CHLORIDE 0.9% FLUSH: 3.0000 mL | Freq: Once | INTRAVENOUS | Status: DC

## 2019-01-07 NOTE — ED Notes (Addendum)
Pt states he is still having pain, but only notices it when he moves.  Pt states he got up from chair in the waiting room and "it really hit me then".   Pt denies pain while resting at this time.

## 2019-01-07 NOTE — ED Triage Notes (Signed)
Pt presents to ED with mid chest pain off and on started this evening. Pt states pain does not radiate. Pt denies SOB, N/V, dizziness, headache. Pt states it helps relieve pain if he lays down.

## 2019-01-08 DIAGNOSIS — R0789 Other chest pain: Secondary | ICD-10-CM | POA: Diagnosis not present

## 2019-01-08 LAB — TROPONIN I (HIGH SENSITIVITY): Troponin I (High Sensitivity): 3 ng/L (ref <18)

## 2019-01-08 MED ORDER — NAPROXEN 500 MG PO TABS: 500.0000 mg | ORAL_TABLET | Freq: Two times a day (BID) | ORAL | 0 refills | Status: DC

## 2019-01-08 NOTE — ED Provider Notes (Signed)
[facility] EMERGENCY DEPARTMENT Provider Note   CSN: [id number] Arrival date & time: 01/07/19  2147     History   Chief Complaint Chief Complaint  Patient presents with  . Chest Pain    HPI Joseph Pruitt is a 53 y.o. male.     Patient presents to the emergency department for evaluation of chest pain.  Patient reports that he has been experiencing intermittent sharp upper chest pain throughout the course of today.  He reports that he twisted his torso to the side and felt a sudden sharp pain earlier today.  This is happened multiple times today, each time and has been in conjunction with movement of the torso.  It does not occur when he is at rest or lying down.  He has no shortness of breath.  He reports that he did have a complete cardiac evaluation at [facility] 8 months ago including a exercise stress test and everything looked normal, no history of heart disease.     Past Medical History:  Diagnosis Date  . Cancer [facility])    colon cancer  . Environmental allergies   . High cholesterol   . Hypertension   . Stomach ulcer     Patient Active Problem List   Diagnosis Date Noted  . Head ache 09/21/2013  . Pain in joint, ankle and foot 01/20/2013  . Left leg weakness 01/20/2013  . CARPAL TUNNEL SYNDROME, HX OF 02/14/2009  . MONONEURITIS OF UNSPECIFIED SITE 11/22/2008  . 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 TENDINITIS, RIGHT WRIST 11/22/2008  . NECK PAIN 08/14/2008    Past Surgical History:  Procedure Laterality Date  . ACHILLES TENDON SURGERY    . COLON RESECTION    . HAND SURGERY    . SPINE SURGERY          Home Medications    Prior to Admission medications   Medication Sig Start Date End Date Taking? Authorizing Provider  aspirin 81 MG chewable tablet Chew 81 mg by mouth daily.     [provider]  fluorouracil in sodium chloride 0.9 % 50 mL Inject into the vein every 14 (fourteen) days.     [provider]   fluticasone Asencion Islam) 50 MCG/ACT nasal spray 1 spray by Each Nare route daily as needed for allergies 10/05/14 09/26/16  [provider]  hydrochlorothiazide (HYDRODIURIL) 25 MG tablet Take 25 mg by mouth daily.    [provider]  hydrOXYzine (ATARAX/VISTARIL) 25 MG tablet Take 1 tablet (25 mg total) by mouth every 6 (six) hours. 11/15/17   Ripley Fraise, MD  naproxen (NAPROSYN) 500 MG tablet Take 1 tablet (500 mg total) by mouth 2 (two) times daily. 01/08/19   Orpah Greek, MD  oxyCODONE-acetaminophen (PERCOCET/ROXICET) 5-325 MG tablet Take 1 tablet by mouth every 6 (six) hours as needed for moderate pain or severe pain.  11/22/14   [provider]  polyethylene glycol powder (GLYCOLAX/MIRALAX) powder Take 17 g by mouth daily.  09/09/16   [provider]  predniSONE (DELTASONE) 50 MG tablet One tablet po daily for 4 days 11/15/17   Ripley Fraise, MD    Family History No family history on file.  Social History Social History   Tobacco Use  . Smoking status: Never Smoker  . Smokeless tobacco: Never Used  Substance Use Topics  . Alcohol use: No  . Drug use: No     Allergies   Patient has no known allergies.   Review of Systems  Review of Systems  Cardiovascular: Positive for chest pain.  All other systems reviewed and are negative.    Physical Exam Updated Vital Signs BP 127/86   Pulse 63   Temp 97.9 F (36.6 C) (Oral)   Resp 16   Ht 6\' 1"  (1.854 m)   Wt 118.4 kg   SpO2 100%   BMI 34.43 kg/m   Physical Exam Vitals signs and nursing note reviewed.  Constitutional:      General: He is not in acute distress.    Appearance: Normal appearance. He is well-developed.  HENT:     Head: Normocephalic and atraumatic.     Right Ear: Hearing normal.     Left Ear: Hearing normal.     Nose: Nose normal.  Eyes:     Conjunctiva/sclera: Conjunctivae normal.     Pupils: Pupils are equal, round, and reactive to light.  Neck:      Musculoskeletal: Normal range of motion and neck supple.  Cardiovascular:     Rate and Rhythm: Regular rhythm.     Heart sounds: S1 normal and S2 normal. No murmur. No friction rub. No gallop.   Pulmonary:     Effort: Pulmonary effort is normal. No respiratory distress.     Breath sounds: Normal breath sounds.  Chest:     Chest wall: No tenderness.  Abdominal:     General: Bowel sounds are normal.     Palpations: Abdomen is soft.     Tenderness: There is no abdominal tenderness. There is no guarding or rebound. Negative signs include Murphy's sign and McBurney's sign.     Hernia: No hernia is present.  Musculoskeletal: Normal range of motion.  Skin:    General: Skin is warm and dry.     Findings: No rash.  Neurological:     Mental Status: He is alert and oriented to person, place, and time.     GCS: GCS eye subscore is 4. GCS verbal subscore is 5. GCS motor subscore is 6.     Cranial Nerves: No cranial nerve deficit.     Sensory: No sensory deficit.     Coordination: Coordination normal.  Psychiatric:        Speech: Speech normal.        Behavior: Behavior normal.        Thought Content: Thought content normal.      ED Treatments / Results  Labs (all labs ordered are listed, but only abnormal results are displayed) Labs Reviewed  BASIC METABOLIC PANEL - Abnormal; Notable for the following components:      Result Value   Glucose, Bld 115 (*)    All other components within normal limits  CBC  TROPONIN I (HIGH SENSITIVITY)  TROPONIN I (HIGH SENSITIVITY)    EKG EKG Interpretation  Date/Time:  Friday January 07 2019 23:43:10 EDT Ventricular Rate:  66 PR Interval:    QRS Duration: 95 QT Interval:  449 QTC Calculation: 471 R Axis:   -22 Text Interpretation: Sinus rhythm Borderline left axis deviation No previous tracing Confirmed by Orpah Greek [id number]) on 01/08/2019 12:05:36 AM   Radiology Dg Chest 2 View  Result Date: 01/07/2019 CLINICAL DATA:  Chest  pain EXAM: CHEST - 2 VIEW COMPARISON:  February 16, 2016 FINDINGS: The heart size and mediastinal contours are within normal limits. Both lungs are clear. The visualized skeletal structures are unremarkable. IMPRESSION: No active cardiopulmonary disease. Electronically Signed   By: Constance Holster M.D.   On: 01/07/2019 22:25  Procedures Procedures (including critical care time)  Medications Ordered in ED Medications  sodium chloride flush (NS) 0.9 % injection 3 mL (3 mLs Intravenous Not Given 01/08/19 0018)     Initial Impression / Assessment and Plan / ED Course  I have reviewed the triage vital signs and the nursing notes.  Pertinent labs & imaging results that were available during my care of the patient were reviewed by me and considered in my medical decision making (see chart for details).        Patient presents with complaints of chest pain.  Pain appears to be musculoskeletal in nature.  He has a sharp pain in the upper central chest area that occurs when he performs certain movements of the torso.  He is not currently experiencing the pain.  Patient has minimal cardiac risk factors.  EKG does not show any evidence of ischemia or infarct.  Troponin negative x2.  He has had previous cardiac work-up that was negative.  Patient is felt to be very low risk for cardiac etiology, symptoms felt to be musculoskeletal in nature.  He is not experiencing any shortness of breath.  There is no pleuritic pain.  He does not have any tachypnea, tachycardia or hypoxia.  PE is not felt likely.  No further work-up necessary, follow-up with primary care.  Final Clinical Impressions(s) / ED Diagnoses   Final diagnoses:  Chest wall pain    ED Discharge Orders         Ordered    naproxen (NAPROSYN) 500 MG tablet  2 times daily     01/08/19 0108           Orpah Greek, MD 01/08/19 [phone]

## 2019-01-08 NOTE — ED Notes (Signed)
Pt sleeping. 

## 2019-06-14 ENCOUNTER — Encounter (HOSPITAL_COMMUNITY): Payer: Self-pay | Admitting: Emergency Medicine

## 2019-06-14 ENCOUNTER — Emergency Department (HOSPITAL_COMMUNITY)
Admission: EM | Admit: 2019-06-14 | Discharge: 2019-06-15 | Disposition: A | Discharge status: Home / Self Care | Payer: Medicare Other | Attending: Emergency Medicine | Admitting: Emergency Medicine

## 2019-06-14 ENCOUNTER — Other Ambulatory Visit: Payer: Self-pay

## 2019-06-14 DIAGNOSIS — Z79899 Other long term (current) drug therapy: Secondary | ICD-10-CM | POA: Diagnosis not present

## 2019-06-14 DIAGNOSIS — Z7982 Long term (current) use of aspirin: Secondary | ICD-10-CM | POA: Insufficient documentation

## 2019-06-14 DIAGNOSIS — I10 Essential (primary) hypertension: Secondary | ICD-10-CM | POA: Insufficient documentation

## 2019-06-14 DIAGNOSIS — Z85038 Personal history of other malignant neoplasm of large intestine: Secondary | ICD-10-CM | POA: Insufficient documentation

## 2019-06-14 DIAGNOSIS — R079 Chest pain, unspecified: Secondary | ICD-10-CM | POA: Diagnosis present

## 2019-06-14 NOTE — ED Triage Notes (Signed)
Pt C/O chest pain that began yesterday. Pt reports the pain is worse with movement. Denies N/V.

## 2019-06-15 ENCOUNTER — Emergency Department (HOSPITAL_COMMUNITY): Payer: Medicare Other

## 2019-06-15 DIAGNOSIS — R079 Chest pain, unspecified: Secondary | ICD-10-CM | POA: Diagnosis not present

## 2019-06-15 LAB — COMPREHENSIVE METABOLIC PANEL
ALT: 20 U/L (ref 0–44)
AST: 23 U/L (ref 15–41)
Albumin: 3.6 g/dL (ref 3.5–5.0)
Alkaline Phosphatase: 74 U/L (ref 38–126)
Anion gap: 8 (ref 5–15)
BUN: 18 mg/dL (ref 6–20)
CO2: 28 mmol/L (ref 22–32)
Calcium: 8.8 mg/dL — ABNORMAL LOW (ref 8.9–10.3)
Chloride: 103 mmol/L (ref 98–111)
Creatinine, Ser: 1.06 mg/dL (ref 0.61–1.24)
GFR calc Af Amer: >60 mL/min (ref >60)
GFR calc non Af Amer: >60 mL/min (ref >60)
Glucose, Bld: 112 mg/dL — ABNORMAL HIGH (ref 70–99)
Potassium: 3.5 mmol/L (ref 3.5–5.1)
Sodium: 139 mmol/L (ref 135–145)
Total Bilirubin: 0.9 mg/dL (ref 0.3–1.2)
Total Protein: 7.5 g/dL (ref 6.5–8.1)

## 2019-06-15 LAB — CBC WITH DIFFERENTIAL/PLATELET
Abs Immature Granulocytes: 0.01 10*3/uL (ref 0.00–0.07)
Basophils Absolute: 0 10*3/uL (ref 0.0–0.1)
Basophils Relative: 1 %
Eosinophils Absolute: 0.1 10*3/uL (ref 0.0–0.5)
Eosinophils Relative: 3 %
HCT: 43.8 % (ref 39.0–52.0)
Hemoglobin: 14.3 g/dL (ref 13.0–17.0)
Immature Granulocytes: 0 %
Lymphocytes Relative: 48 %
Lymphs Abs: 2.1 10*3/uL (ref 0.7–4.0)
MCH: 30.4 pg (ref 26.0–34.0)
MCHC: 32.6 g/dL (ref 30.0–36.0)
MCV: 93 fL (ref 80.0–100.0)
Monocytes Absolute: 0.5 10*3/uL (ref 0.1–1.0)
Monocytes Relative: 11 %
Neutro Abs: 1.6 10*3/uL — ABNORMAL LOW (ref 1.7–7.7)
Neutrophils Relative %: 37 %
Platelets: 200 10*3/uL (ref 150–400)
RBC: 4.71 MIL/uL (ref 4.22–5.81)
RDW: 12.5 % (ref 11.5–15.5)
WBC: 4.4 10*3/uL (ref 4.0–10.5)
nRBC: 0 % (ref 0.0–0.2)

## 2019-06-15 LAB — TROPONIN I (HIGH SENSITIVITY)
Troponin I (High Sensitivity): 2 ng/L (ref <18)
Troponin I (High Sensitivity): 3 ng/L (ref <18)

## 2019-06-15 MED ORDER — FENTANYL CITRATE (PF) 100 MCG/2ML IJ SOLN
50.0000 ug | Freq: Once | INTRAMUSCULAR | Status: AC
  Administered 2019-06-15: 01:00:00 50 ug via INTRAVENOUS
  Filled 2019-06-15: qty 2

## 2019-06-15 NOTE — ED Provider Notes (Signed)
Emergency Department Provider Note   I have reviewed the triage vital signs and the nursing notes.   HISTORY  Chief Complaint Chest Pain   HPI Joseph Pruitt is a 54 y.o. male with medical problems documented below who presents to the emergency department today with chest pain.  Patient dates that he has some chest pain starting about a day ago.  States it is worse with movement and worse with palpation and on the one side.  Patient states it feels similar to previous episode of chest pain where he was told he had a muscle strain.  Patient states that he got better with some oxycodone he took at home but then came back.  Patient states no shortness of breath, nausea, vomiting, diaphoresis, fever or cough.  No recent trauma or falls.  No other associated symptoms.   No other associated or modifying symptoms.    Past Medical History:  Diagnosis Date  . Cancer [facility])    colon cancer  . Environmental allergies   . High cholesterol   . Hypertension   . Stomach ulcer     Patient Active Problem List   Diagnosis Date Noted  . Head ache 09/21/2013  . Pain in joint, ankle and foot 01/20/2013  . Left leg weakness 01/20/2013  . CARPAL TUNNEL SYNDROME, HX OF 02/14/2009  . MONONEURITIS OF UNSPECIFIED SITE 11/22/2008  . 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 TENDINITIS, RIGHT WRIST 11/22/2008  . NECK PAIN 08/14/2008    Past Surgical History:  Procedure Laterality Date  . ACHILLES TENDON SURGERY    . COLON RESECTION    . HAND SURGERY    . SPINE SURGERY      Current Outpatient Rx  . Order #: [id number] Class: Historical Med  . Order #: [id number] Class: Historical Med  . Order #: [id number] Class: Historical Med  . Order #: [id number] Class: Historical Med  . Order #: [id number] Class: Normal  . Order #: [id number] Class: Normal  . Order #: [id number] Class: Historical Med  . Order #: [id number] Class: Historical Med  . Order #: [id number] Class: Normal     Allergies Patient has no known allergies.  No family history on file.  Social History Social History   Tobacco Use  . Smoking status: Never Smoker  . Smokeless tobacco: Never Used  Substance Use Topics  . Alcohol use: No  . Drug use: No    Review of Systems  All other systems negative except as documented in the HPI. All pertinent positives and negatives as reviewed in the HPI. ____________________________________________   PHYSICAL EXAM:  VITAL SIGNS: ED Triage Vitals  Enc Vitals Group     BP 06/14/19 2356 127/87     Pulse Rate 06/14/19 2356 65     Resp 06/14/19 2356 14     Temp 06/14/19 2356 98.4 F (36.9 C)     Temp Source 06/14/19 2356 Oral     SpO2 06/14/19 2356 96 %     Weight 06/14/19 2357 261 lb (118.4 kg)     Height 06/14/19 2357 6\' 1"  (1.854 m)    Constitutional: Alert and oriented. Well appearing and in no acute distress. Eyes: Conjunctivae are normal. PERRL. EOMI. Head: Atraumatic. Nose: No congestion/rhinnorhea. Mouth/Throat: Mucous membranes are moist.  Oropharynx non-erythematous. Neck: No stridor.  No meningeal signs.   Cardiovascular: Normal rate, regular rhythm. Good peripheral circulation. Grossly normal heart sounds.   Respiratory: Normal respiratory effort.  No retractions. Lungs CTAB. Gastrointestinal: Soft and nontender. No distention.  Musculoskeletal: No lower  extremity tenderness nor edema. No gross deformities of extremities. Neurologic:  Normal speech and language. No gross focal neurologic deficits are appreciated.  Skin:  Skin is warm, dry and intact. No rash noted.   ____________________________________________   LABS (all labs ordered are listed, but only abnormal results are displayed)  Labs Reviewed  CBC WITH DIFFERENTIAL/PLATELET - Abnormal; Notable for the following components:      Result Value   Neutro Abs 1.6 (*)    All other components within normal limits  COMPREHENSIVE METABOLIC PANEL - Abnormal; Notable for  the following components:   Glucose, Bld 112 (*)    Calcium 8.8 (*)    All other components within normal limits  TROPONIN I (HIGH SENSITIVITY)  TROPONIN I (HIGH SENSITIVITY)   ____________________________________________  EKG   EKG Interpretation  Date/Time:  Wednesday June 15 2019 00:05:48 EDT Ventricular Rate:  69 PR Interval:    QRS Duration: 96 QT Interval:  436 QTC Calculation: 468 R Axis:   -26 Text Interpretation: Sinus rhythm Borderline left axis deviation No significant change since last tracing Confirmed by Merrily Pew [phone]) on 06/15/2019 3:43:33 AM       ____________________________________________  RADIOLOGY  DG Chest Portable 1 View  Result Date: 06/15/2019 CLINICAL DATA:  Chest pain. EXAM: PORTABLE CHEST 1 VIEW COMPARISON:  01/07/2019 FINDINGS: The heart size and mediastinal contours are within normal limits. Both lungs are clear. The visualized skeletal structures are unremarkable. IMPRESSION: No active disease. Electronically Signed   By: Constance Holster M.D.   On: 06/15/2019 01:57    ____________________________________________   PROCEDURES  Procedure(s) performed:   Procedures   ____________________________________________   INITIAL IMPRESSION / ASSESSMENT AND PLAN / ED COURSE  Pain seems musculoskeletal but is 54 years old with a couple risk factors so we will do delta troponins.  We will treat his pain.  Repeat evaluation with improved chest pain.  Initial troponin negative.  Pending second troponin.  Low suspicion for pulmonary embolus his oxygen does get a bit low when he sleeping but has been diagnosed with sleep apnea and does not have his mask here. When awake, o2 is around 99-100 range  Workup unremarkable. Likely MSK/chest wall. Stable for discharge.   Pertinent labs & imaging results that were available during my care of the patient were reviewed by me and considered in my medical decision making (see chart for details).  A  medical screening exam was performed and I feel the patient has had an appropriate workup for their chief complaint at this time and likelihood of emergent condition existing is low. They have been counseled on decision, discharge, follow up and which symptoms necessitate immediate return to the emergency department. They or their family verbally stated understanding and agreement with plan and discharged in stable condition.   ____________________________________________  FINAL CLINICAL IMPRESSION(S) / ED DIAGNOSES  Final diagnoses:  Nonspecific chest pain     MEDICATIONS GIVEN DURING THIS VISIT:  Medications  fentaNYL (SUBLIMAZE) injection 50 mcg (50 mcg Intravenous Given 06/15/19 0113)     NEW OUTPATIENT MEDICATIONS STARTED DURING THIS VISIT:  Discharge Medication List as of 06/15/2019  4:02 AM      Note:  This note was prepared with assistance of Dragon voice recognition software. Occasional wrong-word or sound-a-like substitutions may have occurred due to the inherent limitations of voice recognition software.   Warner Laduca, Corene Cornea, MD 06/15/19 [id number]

## 2019-09-10 ENCOUNTER — Emergency Department (HOSPITAL_COMMUNITY)
Admission: EM | Admit: 2019-09-10 | Discharge: 2019-09-10 | Disposition: A | Discharge status: Home / Self Care | Payer: Medicare Other | Attending: Emergency Medicine | Admitting: Emergency Medicine

## 2019-09-10 ENCOUNTER — Encounter (HOSPITAL_COMMUNITY): Payer: Self-pay | Admitting: Emergency Medicine

## 2019-09-10 ENCOUNTER — Other Ambulatory Visit: Payer: Self-pay

## 2019-09-10 ENCOUNTER — Emergency Department (HOSPITAL_COMMUNITY): Payer: Medicare Other

## 2019-09-10 DIAGNOSIS — Z85038 Personal history of other malignant neoplasm of large intestine: Secondary | ICD-10-CM | POA: Diagnosis not present

## 2019-09-10 DIAGNOSIS — Z79899 Other long term (current) drug therapy: Secondary | ICD-10-CM | POA: Diagnosis not present

## 2019-09-10 DIAGNOSIS — R0602 Shortness of breath: Secondary | ICD-10-CM | POA: Insufficient documentation

## 2019-09-10 DIAGNOSIS — R0789 Other chest pain: Secondary | ICD-10-CM | POA: Insufficient documentation

## 2019-09-10 DIAGNOSIS — Z7982 Long term (current) use of aspirin: Secondary | ICD-10-CM | POA: Diagnosis not present

## 2019-09-10 DIAGNOSIS — I10 Essential (primary) hypertension: Secondary | ICD-10-CM | POA: Insufficient documentation

## 2019-09-10 LAB — D-DIMER, QUANTITATIVE: D-Dimer, Quant: <0.27 ug/mL-FEU (ref 0.00–0.50)

## 2019-09-10 LAB — COMPREHENSIVE METABOLIC PANEL
ALT: 21 U/L (ref 0–44)
AST: 23 U/L (ref 15–41)
Albumin: 3.5 g/dL (ref 3.5–5.0)
Alkaline Phosphatase: 84 U/L (ref 38–126)
Anion gap: 9 (ref 5–15)
BUN: 15 mg/dL (ref 6–20)
CO2: 29 mmol/L (ref 22–32)
Calcium: 8.6 mg/dL — ABNORMAL LOW (ref 8.9–10.3)
Chloride: 98 mmol/L (ref 98–111)
Creatinine, Ser: 1.01 mg/dL (ref 0.61–1.24)
GFR calc Af Amer: >60 mL/min (ref >60)
GFR calc non Af Amer: >60 mL/min (ref >60)
Glucose, Bld: 123 mg/dL — ABNORMAL HIGH (ref 70–99)
Potassium: 3.6 mmol/L (ref 3.5–5.1)
Sodium: 136 mmol/L (ref 135–145)
Total Bilirubin: 1 mg/dL (ref 0.3–1.2)
Total Protein: 7 g/dL (ref 6.5–8.1)

## 2019-09-10 LAB — CBC WITH DIFFERENTIAL/PLATELET
Abs Immature Granulocytes: 0.01 10*3/uL (ref 0.00–0.07)
Basophils Absolute: 0 10*3/uL (ref 0.0–0.1)
Basophils Relative: 1 %
Eosinophils Absolute: 0.1 10*3/uL (ref 0.0–0.5)
Eosinophils Relative: 3 %
HCT: 42.5 % (ref 39.0–52.0)
Hemoglobin: 14.5 g/dL (ref 13.0–17.0)
Immature Granulocytes: 0 %
Lymphocytes Relative: 42 %
Lymphs Abs: 1.7 10*3/uL (ref 0.7–4.0)
MCH: 31.7 pg (ref 26.0–34.0)
MCHC: 34.1 g/dL (ref 30.0–36.0)
MCV: 92.8 fL (ref 80.0–100.0)
Monocytes Absolute: 0.5 10*3/uL (ref 0.1–1.0)
Monocytes Relative: 12 %
Neutro Abs: 1.7 10*3/uL (ref 1.7–7.7)
Neutrophils Relative %: 42 %
Platelets: 183 10*3/uL (ref 150–400)
RBC: 4.58 MIL/uL (ref 4.22–5.81)
RDW: 12.6 % (ref 11.5–15.5)
WBC: 4 10*3/uL (ref 4.0–10.5)
nRBC: 0 % (ref 0.0–0.2)

## 2019-09-10 LAB — TROPONIN I (HIGH SENSITIVITY)
Troponin I (High Sensitivity): 3 ng/L (ref <18)
Troponin I (High Sensitivity): 4 ng/L (ref <18)

## 2019-09-10 MED ORDER — KETOROLAC TROMETHAMINE 30 MG/ML IJ SOLN
30.0000 mg | Freq: Once | INTRAMUSCULAR | Status: AC
  Administered 2019-09-10: 30 mg via INTRAMUSCULAR
  Filled 2019-09-10: qty 1

## 2019-09-10 MED ORDER — IBUPROFEN 800 MG PO TABS: 800.0000 mg | ORAL_TABLET | Freq: Three times a day (TID) | ORAL | 0 refills | Status: DC | PRN

## 2019-09-10 MED ORDER — KETOROLAC TROMETHAMINE 30 MG/ML IJ SOLN: 30.0000 mg | Freq: Once | INTRAMUSCULAR | Status: DC

## 2019-09-10 NOTE — ED Triage Notes (Signed)
Pt c/o sob and right sided rib pain with movement.  Pt states he lifted two car jacks on a truck yesterday

## 2019-09-10 NOTE — Discharge Instructions (Addendum)
Follow-up with your family doctor next week if any problem

## 2019-09-10 NOTE — ED Provider Notes (Signed)
[facility] Provider Note   CSN: [id number] Arrival date & time: 09/10/19  0818     History Chief Complaint  Patient presents with  . Shortness of Breath    Joseph Pruitt is a 54 y.o. male.  Patient complains of some chest discomfort and shortness of breath no sweating no fever  The history is provided by the patient, a relative and medical records. No language interpreter was used.  Shortness of Breath Severity:  Mild Onset quality:  Sudden Timing:  Sporadic Progression:  Resolved Chronicity:  New Context: not activity   Worsened by:  Nothing Ineffective treatments:  None tried Associated symptoms: no abdominal pain, no chest pain, no cough, no headaches and no rash   Risk factors: no recent alcohol use        Past Medical History:  Diagnosis Date  . Cancer [facility])    colon cancer  . Environmental allergies   . High cholesterol   . Hypertension   . Stomach ulcer     Patient Active Problem List   Diagnosis Date Noted  . Head ache 09/21/2013  . Pain in joint, ankle and foot 01/20/2013  . Left leg weakness 01/20/2013  . CARPAL TUNNEL SYNDROME, HX OF 02/14/2009  . MONONEURITIS OF UNSPECIFIED SITE 11/22/2008  . 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 TENDINITIS, RIGHT WRIST 11/22/2008  . NECK PAIN 08/14/2008    Past Surgical History:  Procedure Laterality Date  . ACHILLES TENDON SURGERY    . COLON RESECTION    . HAND SURGERY    . SPINE SURGERY         History reviewed. No pertinent family history.  Social History   Tobacco Use  . Smoking status: Never Smoker  . Smokeless tobacco: Never Used  Substance Use Topics  . Alcohol use: No  . Drug use: No    Home Medications Prior to Admission medications   Medication Sig Start Date End Date Taking? Authorizing Provider  fluticasone (FLONASE) 50 MCG/ACT nasal spray 1 spray by Each Nare route daily as needed for allergies 10/05/14 09/10/19 Yes [provider]    hydrochlorothiazide (HYDRODIURIL) 25 MG tablet Take 25 mg by mouth daily.   Yes [provider]  oxyCODONE-acetaminophen (PERCOCET/ROXICET) 5-325 MG tablet Take 1 tablet by mouth every 6 (six) hours as needed for moderate pain or severe pain.  11/22/14  Yes [provider]  aspirin 81 MG chewable tablet Chew 81 mg by mouth daily.  Patient not taking: Reported on 09/10/2019    [provider]  fluorouracil in sodium chloride 0.9 % 50 mL Inject into the vein every 14 (fourteen) days.  Patient not taking: Reported on 09/10/2019    [provider]  hydrOXYzine (ATARAX/VISTARIL) 25 MG tablet Take 1 tablet (25 mg total) by mouth every 6 (six) hours. Patient not taking: Reported on 09/10/2019 11/15/17   Ripley Fraise, MD  ibuprofen (ADVIL) 800 MG tablet Take 1 tablet (800 mg total) by mouth every 8 (eight) hours as needed for moderate pain. 09/10/19   Milton Ferguson, MD  naproxen (NAPROSYN) 500 MG tablet Take 1 tablet (500 mg total) by mouth 2 (two) times daily. Patient not taking: Reported on 09/10/2019 01/08/19   Orpah Greek, MD  polyethylene glycol powder (GLYCOLAX/MIRALAX) powder Take 17 g by mouth daily.  Patient not taking: Reported on 09/10/2019 09/09/16   [provider]  predniSONE (DELTASONE) 50 MG tablet One tablet po daily for 4 days Patient not taking:  Reported on 09/10/2019 11/15/17   Ripley Fraise, MD    Allergies    Patient has no known allergies.  Review of Systems   Review of Systems  Constitutional: Negative for appetite change and fatigue.  HENT: Negative for congestion, ear discharge and sinus pressure.   Eyes: Negative for discharge.  Respiratory: Positive for shortness of breath. Negative for cough.   Cardiovascular: Negative for chest pain.  Gastrointestinal: Negative for abdominal pain and diarrhea.  Genitourinary: Negative for frequency and hematuria.  Musculoskeletal: Negative for back pain.  Skin: Negative for rash.   Neurological: Negative for seizures and headaches.  Psychiatric/Behavioral: Negative for hallucinations.    Physical Exam Updated Vital Signs BP (!) 140/92   Pulse 70   Temp 98.8 F (37.1 C) (Oral)   Resp 17   Ht 6\' 1"  (1.854 m)   Wt 117.9 kg   SpO2 97%   BMI 34.30 kg/m   Physical Exam Vitals and nursing note reviewed.  Constitutional:      Appearance: He is well-developed.  HENT:     Head: Normocephalic.     Nose: Nose normal.  Eyes:     General: No scleral icterus.    Conjunctiva/sclera: Conjunctivae normal.  Neck:     Thyroid: No thyromegaly.  Cardiovascular:     Rate and Rhythm: Normal rate and regular rhythm.     Heart sounds: No murmur heard.  No friction rub. No gallop.   Pulmonary:     Breath sounds: No stridor. No wheezing or rales.     Comments: Chest wall pain Chest:     Chest wall: No tenderness.  Abdominal:     General: There is no distension.     Tenderness: There is no abdominal tenderness. There is no rebound.  Musculoskeletal:        General: Normal range of motion.     Cervical back: Neck supple.  Lymphadenopathy:     Cervical: No cervical adenopathy.  Skin:    Findings: No erythema or rash.  Neurological:     Mental Status: He is alert and oriented to person, place, and time.     Motor: No abnormal muscle tone.     Coordination: Coordination normal.  Psychiatric:        Behavior: Behavior normal.     ED Results / Procedures / Treatments   Labs (all labs ordered are listed, but only abnormal results are displayed) Labs Reviewed  COMPREHENSIVE METABOLIC PANEL - Abnormal; Notable for the following components:      Result Value   Glucose, Bld 123 (*)    Calcium 8.6 (*)    All other components within normal limits  CBC WITH DIFFERENTIAL/PLATELET  D-DIMER, QUANTITATIVE (NOT AT [facility])  TROPONIN I (HIGH SENSITIVITY)  TROPONIN I (HIGH SENSITIVITY)    EKG None  Radiology DG Chest 2 View  Result Date: 09/10/2019 CLINICAL DATA:   Right chest pain starting this morning. EXAM: CHEST - 2 VIEW COMPARISON:  June 15, 2019 FINDINGS: The heart size and mediastinal contours are within normal limits. Both lungs are clear. The visualized skeletal structures are unremarkable. IMPRESSION: No active cardiopulmonary disease. Electronically Signed   By: Abelardo Diesel M.D.   On: 09/10/2019 09:41    Procedures Procedures (including critical care time)  Medications Ordered in ED Medications  ketorolac (TORADOL) 30 MG/ML injection 30 mg (30 mg Intramuscular Given 09/10/19 0908)    ED Course  I have reviewed the triage vital signs and the nursing notes.  Pertinent  labs & imaging results that were available during my care of the patient were reviewed by me and considered in my medical decision making (see chart for details).    MDM Rules/Calculators/A&P                          Patient with atypical chest pain.  Patient with negative troponins x2 EKG unremarkable chest x-ray normal.  Doubt cardiac related pain.  He will be sent home with Motrin and will follow up with PCP         This patient presents to the ED for concern of chest pain, this involves an extensive number of treatment options, and is a complaint that carries with it a high risk of complications and morbidity.  The differential diagnosis includes MI PE noncardiac pain   Lab Tests:   I Ordered, reviewed, and interpreted labs, which included CBC chemistries troponin all unremarkable  Medicines ordered:   I ordered medication Toradol for pain  Imaging Studies ordered:   I ordered imaging studies which included chest x-ray and  I independently visualized and interpreted imaging which showed negative  Additional history obtained:   Additional history obtained from old records and significant other  Previous records obtained and reviewed.  Consultations Obtained:   Reevaluation:  After the interventions stated above, I reevaluated the patient and  found mild improvement  Critical Interventions:  .   Final Clinical Impression(s) / ED Diagnoses Final diagnoses:  Atypical chest pain    Rx / DC Orders ED Discharge Orders         Ordered    ibuprofen (ADVIL) 800 MG tablet  Every 8 hours PRN     Discontinue  Reprint     09/10/19 1409           Milton Ferguson, MD 09/10/19 1414

## 2019-10-17 ENCOUNTER — Other Ambulatory Visit: Payer: Self-pay

## 2019-10-17 ENCOUNTER — Encounter (HOSPITAL_COMMUNITY): Payer: Self-pay | Admitting: *Deleted

## 2019-10-17 ENCOUNTER — Emergency Department (HOSPITAL_COMMUNITY)
Admission: EM | Admit: 2019-10-17 | Discharge: 2019-10-17 | Disposition: A | Discharge status: Home / Self Care | Payer: Medicare Other | Attending: Emergency Medicine | Admitting: Emergency Medicine

## 2019-10-17 DIAGNOSIS — I10 Essential (primary) hypertension: Secondary | ICD-10-CM | POA: Insufficient documentation

## 2019-10-17 DIAGNOSIS — U071 COVID-19: Secondary | ICD-10-CM | POA: Insufficient documentation

## 2019-10-17 DIAGNOSIS — C189 Malignant neoplasm of colon, unspecified: Secondary | ICD-10-CM | POA: Diagnosis not present

## 2019-10-17 DIAGNOSIS — J069 Acute upper respiratory infection, unspecified: Secondary | ICD-10-CM

## 2019-10-17 DIAGNOSIS — R509 Fever, unspecified: Secondary | ICD-10-CM | POA: Diagnosis present

## 2019-10-17 LAB — SARS CORONAVIRUS 2 BY RT PCR (HOSPITAL ORDER, PERFORMED IN ~~LOC~~ HOSPITAL LAB): SARS Coronavirus 2: POSITIVE — AB

## 2019-10-17 MED ORDER — ACETAMINOPHEN 500 MG PO TABS
1000.0000 mg | ORAL_TABLET | Freq: Once | ORAL | Status: AC
  Administered 2019-10-17: 1000 mg via ORAL
  Filled 2019-10-17: qty 2

## 2019-10-17 NOTE — ED Provider Notes (Signed)
[facility] EMERGENCY DEPARTMENT Provider Note   CSN: [id number] Arrival date & time: 10/17/19  1307     History Chief Complaint  Patient presents with  . Fever  . Generalized Body Aches    Joseph Pruitt is a 54 y.o. male.  The history is provided by the patient. No language interpreter was used.  Fever Max temp prior to arrival:  103 Temp source:  Subjective Severity:  Moderate Onset quality:  Gradual Duration:  3 days Timing:  Constant Progression:  Worsening Chronicity:  New Worsened by:  Nothing Ineffective treatments:  None tried Associated symptoms: cough        Past Medical History:  Diagnosis Date  . Cancer [facility])    colon cancer  . Environmental allergies   . High cholesterol   . Hypertension   . Stomach ulcer     Patient Active Problem List   Diagnosis Date Noted  . Head ache 09/21/2013  . Pain in joint, ankle and foot 01/20/2013  . Left leg weakness 01/20/2013  . CARPAL TUNNEL SYNDROME, HX OF 02/14/2009  . MONONEURITIS OF UNSPECIFIED SITE 11/22/2008  . 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 TENDINITIS, RIGHT WRIST 11/22/2008  . NECK PAIN 08/14/2008    Past Surgical History:  Procedure Laterality Date  . ACHILLES TENDON SURGERY    . COLON RESECTION    . HAND SURGERY    . SPINE SURGERY         History reviewed. No pertinent family history.  Social History   Tobacco Use  . Smoking status: Never Smoker  . Smokeless tobacco: Never Used  Substance Use Topics  . Alcohol use: No  . Drug use: No    Home Medications Prior to Admission medications   Medication Sig Start Date End Date Taking? Authorizing Provider  fluticasone (FLONASE) 50 MCG/ACT nasal spray 1 spray by Each Nare route daily as needed for allergies 10/05/14 09/10/19  [provider]  hydrochlorothiazide (HYDRODIURIL) 25 MG tablet Take 25 mg by mouth daily.    [provider]  ibuprofen (ADVIL) 800 MG tablet Take 1 tablet (800 mg total) by  mouth every 8 (eight) hours as needed for moderate pain. 09/10/19   Milton Ferguson, MD  oxyCODONE-acetaminophen (PERCOCET/ROXICET) 5-325 MG tablet Take 1 tablet by mouth every 6 (six) hours as needed for moderate pain or severe pain.  11/22/14   [provider]    Allergies    Patient has no known allergies.  Review of Systems   Review of Systems  Constitutional: Positive for fever.  Respiratory: Positive for cough.   All other systems reviewed and are negative.   Physical Exam Updated Vital Signs BP 131/84 (BP Location: Right Arm)   Pulse 86   Temp (!) 102.9 F (39.4 C) (Oral)   Resp 18   Ht 6\' 1"  (1.854 m)   Wt 118.4 kg   SpO2 98%   BMI 34.43 kg/m   Physical Exam Vitals and nursing note reviewed.  Constitutional:      Appearance: He is well-developed.  HENT:     Head: Normocephalic and atraumatic.     Mouth/Throat:     Mouth: Mucous membranes are moist.  Eyes:     Conjunctiva/sclera: Conjunctivae normal.  Cardiovascular:     Rate and Rhythm: Normal rate and regular rhythm.     Heart sounds: No murmur heard.   Pulmonary:     Effort: Pulmonary effort is normal. No respiratory distress.     Breath  sounds: Normal breath sounds.  Abdominal:     Palpations: Abdomen is soft.     Tenderness: There is no abdominal tenderness.  Musculoskeletal:     Cervical back: Normal range of motion and neck supple.  Skin:    General: Skin is warm and dry.  Neurological:     Mental Status: He is alert.     ED Results / Procedures / Treatments   Labs (all labs ordered are listed, but only abnormal results are displayed) Labs Reviewed  SARS CORONAVIRUS 2 BY RT PCR (HOSPITAL ORDER, [place] [facility])    EKG None  Radiology No results found.  Procedures Procedures (including critical care time)  Medications Ordered in ED Medications  acetaminophen (TYLENOL) tablet 1,000 mg (1,000 mg Oral Given 10/17/19 1509)    ED Course  I have reviewed the  triage vital signs and the nursing notes.  Pertinent labs & imaging results that were available during my care of the patient were reviewed by me and considered in my medical decision making (see chart for details).    MDM Rules/Calculators/A&P                          MDM:  Covid test is pending. Pt's symptoms consistent with covid.  Pt given tylenol here.  Pt is advised to return if symptoms worsen or change.  Final Clinical Impression(s) / ED Diagnoses Final diagnoses:  Upper respiratory tract infection, unspecified type    Rx / DC Orders ED Discharge Orders    None    An After Visit Summary was printed and given to the patient.    Fransico Meadow, [place] 10/17/19 1722    Margette Fast, MD 10/19/19 1943

## 2019-10-17 NOTE — ED Triage Notes (Signed)
Fever, body aches onset 3 days ago

## 2019-10-17 NOTE — Discharge Instructions (Signed)
Your covid test should return in the next 2-3 hours.  Your symptoms are consistent with covid.  Take tylenol every 4 hours.

## 2019-10-17 NOTE — ED Notes (Signed)
Attempted to call patient about Covid positive result with no answer and mailbox full.

## 2019-10-18 ENCOUNTER — Telehealth (HOSPITAL_COMMUNITY): Payer: Self-pay | Admitting: Oncology

## 2019-10-18 ENCOUNTER — Encounter (HOSPITAL_COMMUNITY): Payer: Self-pay | Admitting: Oncology

## 2019-10-18 NOTE — Telephone Encounter (Signed)
Called to Discuss with patient about Covid symptoms and the use of regeneron, a monoclonal antibody infusion for those with mild to moderate Covid symptoms and at a high risk of hospitalization.     Pt may qualify for this infusion at the [facility] infusion center due to co-morbid conditions and/or a member of an at-risk group.     Unable to reach pt. No VM available. No my chart available.   Rulon Abide, NP, AGNP-C [phone] ([place])

## 2019-11-13 ENCOUNTER — Emergency Department (HOSPITAL_COMMUNITY)
Admission: EM | Admit: 2019-11-13 | Discharge: 2019-11-13 | Disposition: A | Discharge status: Home / Self Care | Payer: Medicare Other | Attending: Emergency Medicine | Admitting: Emergency Medicine

## 2019-11-13 ENCOUNTER — Other Ambulatory Visit: Payer: Self-pay

## 2019-11-13 ENCOUNTER — Encounter (HOSPITAL_COMMUNITY): Payer: Self-pay | Admitting: Emergency Medicine

## 2019-11-13 ENCOUNTER — Emergency Department (HOSPITAL_COMMUNITY): Payer: Medicare Other

## 2019-11-13 DIAGNOSIS — E669 Obesity, unspecified: Secondary | ICD-10-CM | POA: Diagnosis not present

## 2019-11-13 DIAGNOSIS — M79671 Pain in right foot: Secondary | ICD-10-CM | POA: Diagnosis present

## 2019-11-13 DIAGNOSIS — I10 Essential (primary) hypertension: Secondary | ICD-10-CM | POA: Diagnosis not present

## 2019-11-13 DIAGNOSIS — M7661 Achilles tendinitis, right leg: Secondary | ICD-10-CM | POA: Insufficient documentation

## 2019-11-13 DIAGNOSIS — Z79899 Other long term (current) drug therapy: Secondary | ICD-10-CM | POA: Insufficient documentation

## 2019-11-13 LAB — URIC ACID: Uric Acid, Serum: 8.4 mg/dL (ref 3.7–8.6)

## 2019-11-13 MED ORDER — KETOROLAC TROMETHAMINE 30 MG/ML IJ SOLN
30.0000 mg | Freq: Once | INTRAMUSCULAR | Status: AC
  Administered 2019-11-13: 30 mg via INTRAMUSCULAR
  Filled 2019-11-13: qty 1

## 2019-11-13 NOTE — ED Provider Notes (Signed)
[facility] EMERGENCY DEPARTMENT Provider Note   CSN: [id number] Arrival date & time: 11/13/19  0215   Time seen 4:10 AM  History Chief Complaint  Patient presents with  . Foot Pain    Joseph Pruitt is a 54 y.o. male.  HPI   When I go in the room patient is asleep.  When I wake him up by asking why he is here he states "my Achilles".  And falls back asleep.  He states his right foot and ankle started swelling the evening of September 3.  He denies any fever.  He denies any known injury.  He states yesterday got very painful and it is hard to walk.  He denies history of diabetes.  He states he has had surgery on his left Achilles tendon done at [facility] about 10 years ago.  He states if he needs any other surgery he would want that done at [facility] also.  He states he has PCP is at [facility] so he can get a referral if needed.  Patient was seen in the ED on August 9 and had a positive Covid test, when they tried to call him however his voicemail was full.  He was informed tonight that it had been positive.  PCP Raelyn Mora, MD   Past Medical History:  Diagnosis Date  . Cancer [facility])    colon cancer  . Environmental allergies   . High cholesterol   . Hypertension   . Stomach ulcer     Patient Active Problem List   Diagnosis Date Noted  . Head ache 09/21/2013  . Pain in joint, ankle and foot 01/20/2013  . Left leg weakness 01/20/2013  . CARPAL TUNNEL SYNDROME, HX OF 02/14/2009  . MONONEURITIS OF UNSPECIFIED SITE 11/22/2008  . 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 TENDINITIS, RIGHT WRIST 11/22/2008  . NECK PAIN 08/14/2008    Past Surgical History:  Procedure Laterality Date  . ACHILLES TENDON SURGERY    . COLON RESECTION    . HAND SURGERY    . SPINE SURGERY         History reviewed. No pertinent family history.  Social History   Tobacco Use  . Smoking status: Never Smoker  . Smokeless tobacco: Never Used  Substance Use Topics    . Alcohol use: No  . Drug use: No    Home Medications Prior to Admission medications   Medication Sig Start Date End Date Taking? Authorizing Provider  fluticasone (FLONASE) 50 MCG/ACT nasal spray 1 spray by Each Nare route daily as needed for allergies 10/05/14 09/10/19  [provider]  hydrochlorothiazide (HYDRODIURIL) 25 MG tablet Take 25 mg by mouth daily.    [provider]  ibuprofen (ADVIL) 800 MG tablet Take 1 tablet (800 mg total) by mouth every 8 (eight) hours as needed for moderate pain. 09/10/19   Milton Ferguson, MD  oxyCODONE-acetaminophen (PERCOCET/ROXICET) 5-325 MG tablet Take 1 tablet by mouth every 6 (six) hours as needed for moderate pain or severe pain.  11/22/14   [provider]    Allergies    Patient has no known allergies.  Review of Systems   Review of Systems  All other systems reviewed and are negative.   Physical Exam Updated Vital Signs BP 132/81   Pulse 64   Temp 98.7 F (37.1 C) (Oral)   Resp 16   Ht 6\' 1"  (1.854 m)   Wt 118 kg   SpO2 94%  BMI 34.32 kg/m   Physical Exam Vitals and nursing note reviewed.  Constitutional:      General: He is not in acute distress.    Appearance: Normal appearance. He is obese. He is not toxic-appearing.     Comments: Patient falls asleep and I have to wake him up multiple times to do his interview.  HENT:     Head: Normocephalic and atraumatic.  Eyes:     Extraocular Movements: Extraocular movements intact.     Conjunctiva/sclera: Conjunctivae normal.  Cardiovascular:     Rate and Rhythm: Normal rate.  Pulmonary:     Effort: Pulmonary effort is normal. No respiratory distress.  Musculoskeletal:        General: Swelling present.     Cervical back: Normal range of motion.     Comments: Patient is nontender to palpation in his right knee or right lower leg.  He has some mild diffuse swelling around his malleoli bilaterally.  He is very tender to palpation over the Achilles tendon.   It appears to be intact.  He also has some tenderness inferior and posterior of both malleoli.  He has very minimal tenderness in his foot.  Skin:    General: Skin is warm and dry.     Findings: No erythema.  Neurological:     General: No focal deficit present.     Mental Status: He is oriented to person, place, and time.     Cranial Nerves: No cranial nerve deficit.  Psychiatric:        Mood and Affect: Mood normal.        Behavior: Behavior normal.        Thought Content: Thought content normal.     ED Results / Procedures / Treatments   Labs (all labs ordered are listed, but only abnormal results are displayed) Results for orders placed or performed during the hospital encounter of 11/13/19  Uric acid  Result Value Ref Range   Uric Acid, Serum 8.4 3.7 - 8.6 mg/dL      EKG None  Radiology DG Ankle Complete Right  Result Date: 11/13/2019 CLINICAL DATA:  Chronic right ankle pain.  No known injury. EXAM: RIGHT ANKLE - COMPLETE 3+ VIEW COMPARISON:  None. FINDINGS: Plantar and posterior calcaneal spurs. No acute bony abnormality. Specifically, no fracture, subluxation, or dislocation. IMPRESSION: No acute bony abnormality. Electronically Signed   By: Rolm Baptise M.D.   On: 11/13/2019 03:16    Procedures Procedures (including critical care time)  Medications Ordered in ED Medications  ketorolac (TORADOL) 30 MG/ML injection 30 mg (30 mg Intramuscular Given 11/13/19 0443)    ED Course  I have reviewed the triage vital signs and the nursing notes.  Pertinent labs & imaging results that were available during my care of the patient were reviewed by me and considered in my medical decision making (see chart for details).    MDM Rules/Calculators/A&P                          Patient denies any recent injury to his right foot/ankle.  He denies drinking alcohol.  When I review care everywhere this is already been discussed with his cancer doctor.  Patient has stage IIIa colon  cancer.  He gets #120 oxycodone 5/325 monthly, last filled August 19.  Also his notes his oncologist documented that he had ordered physical therapist for his Achilles tendon.  He is also being referred to sports medicine.  I had talked to patient about putting him on crutches until he is able to walk better.  Patient was given Toradol IM while waiting for a uric acid to be done to make sure he did not have underlying gout.  Recheck at time of discharge, patient's pain was better after the Toradol.  We discussed stretching his Achilles tendon by dorsiflexing his foot.  He had been fitted with crutches by nursing staff.  He has narcotic pain pills that he already has.  He can take ibuprofen over-the-counter.  Patient was discharged home.  He is to follow-up with his primary care doctor who is already arranging for him to see a sports medicine doctor.    Final Clinical Impression(s) / ED Diagnoses Final diagnoses:  Tendonitis, Achilles, right    Rx / DC Orders ED Discharge Orders    None    OTC ibuprofen   Plan discharge  Rolland Porter, MD, Barbette Or, MD 11/13/19 [phone]

## 2019-11-13 NOTE — ED Triage Notes (Signed)
Pt c/o right ankle pain. Denies injury. States he was told he needed surgery on his achilles tendon years ago but hasn't had it done.

## 2019-11-13 NOTE — Discharge Instructions (Addendum)
Elevate your foot, use ice packs for pain and swelling.  You can take ibuprofen 600 mg 4 times a day as needed for pain.  You should have plenty of pain pills, you got 120 tablets of oxycodone 5/325 on August 19.  Use the crutches until you are able to walk on that leg.  You need to stretch that tendon by pulling your toes back towards your head.  Please follow-up with your doctors about getting the physical therapy they had ordered.

## 2020-01-24 DIAGNOSIS — M150 Primary generalized (osteo)arthritis: Secondary | ICD-10-CM | POA: Insufficient documentation

## 2020-05-17 DIAGNOSIS — G620 Drug-induced polyneuropathy: Secondary | ICD-10-CM | POA: Insufficient documentation

## 2020-07-10 ENCOUNTER — Emergency Department (HOSPITAL_COMMUNITY)
Admission: EM | Admit: 2020-07-10 | Discharge: 2020-07-11 | Disposition: A | Discharge status: Home / Self Care | Payer: Medicare Other | Attending: Emergency Medicine | Admitting: Emergency Medicine

## 2020-07-10 ENCOUNTER — Other Ambulatory Visit: Payer: Self-pay

## 2020-07-10 ENCOUNTER — Encounter (HOSPITAL_COMMUNITY): Payer: Self-pay | Admitting: Emergency Medicine

## 2020-07-10 DIAGNOSIS — Z79899 Other long term (current) drug therapy: Secondary | ICD-10-CM | POA: Diagnosis not present

## 2020-07-10 DIAGNOSIS — Z85038 Personal history of other malignant neoplasm of large intestine: Secondary | ICD-10-CM | POA: Diagnosis not present

## 2020-07-10 DIAGNOSIS — R109 Unspecified abdominal pain: Secondary | ICD-10-CM | POA: Diagnosis present

## 2020-07-10 DIAGNOSIS — I10 Essential (primary) hypertension: Secondary | ICD-10-CM | POA: Insufficient documentation

## 2020-07-10 DIAGNOSIS — M545 Low back pain, unspecified: Secondary | ICD-10-CM | POA: Diagnosis not present

## 2020-07-10 MED ORDER — MORPHINE SULFATE (PF) 4 MG/ML IV SOLN
4.0000 mg | Freq: Once | INTRAVENOUS | Status: AC
  Administered 2020-07-11: 4 mg via INTRAVENOUS
  Filled 2020-07-10: qty 1

## 2020-07-10 MED ORDER — SODIUM CHLORIDE 0.9 % IV BOLUS
500.0000 mL | Freq: Once | INTRAVENOUS | Status: AC
  Administered 2020-07-11: 500 mL via INTRAVENOUS

## 2020-07-10 NOTE — ED Provider Notes (Addendum)
[facility] EMERGENCY DEPARTMENT Provider Note   CSN: [id number] Arrival date & time: 07/10/20  2130     History Chief Complaint  Patient presents with  . Flank Pain    Joseph Pruitt is a 55 y.o. male.  HPI   55 year old male with past medical history of colon cancer, HTN, HLD presents the emergency department bilateral flank pain.  Patient states that he had colonoscopy yesterday, they reportedly removed 6-7 polyps.  Earlier this afternoon he developed bilateral flank pain that sometimes radiates into his buttocks. No specific abdominal pain.  Patient had a loose bowel movement this morning.  No rectal bleeding.  Denies any genitourinary symptoms.  No history of AAA.  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  Denies any midline spine pain, no lower extremity symptoms/weakness.  No chest pain, shortness of breath.  Past Medical History:  Diagnosis Date  . Cancer [facility])    colon cancer  . Environmental allergies   . High cholesterol   . Hypertension   . Stomach ulcer     Patient Active Problem List   Diagnosis Date Noted  . Head ache 09/21/2013  . Pain in joint, ankle and foot 01/20/2013  . Left leg weakness 01/20/2013  . CARPAL TUNNEL SYNDROME, HX OF 02/14/2009  . MONONEURITIS OF UNSPECIFIED SITE 11/22/2008  . 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 TENDINITIS, RIGHT WRIST 11/22/2008  . NECK PAIN 08/14/2008    Past Surgical History:  Procedure Laterality Date  . ACHILLES TENDON SURGERY    . COLON RESECTION    . HAND SURGERY    . SPINE SURGERY         No family history on file.  Social History   Tobacco Use  . Smoking status: Never Smoker  . Smokeless tobacco: Never Used  Substance Use Topics  . Alcohol use: No  . Drug use: No    Home Medications Prior to Admission medications   Medication Sig Start Date End Date Taking? Authorizing Provider  fluticasone (FLONASE) 50 MCG/ACT  nasal spray 1 spray by Each Nare route daily as needed for allergies 10/05/14 09/10/19  [provider]  hydrochlorothiazide (HYDRODIURIL) 25 MG tablet Take 25 mg by mouth daily.    [provider]  ibuprofen (ADVIL) 800 MG tablet Take 1 tablet (800 mg total) by mouth every 8 (eight) hours as needed for moderate pain. 09/10/19   Milton Ferguson, MD  oxyCODONE-acetaminophen (PERCOCET/ROXICET) 5-325 MG tablet Take 1 tablet by mouth every 6 (six) hours as needed for moderate pain or severe pain.  11/22/14   [provider]    Allergies    Patient has no known allergies.  Review of Systems   Review of Systems  Constitutional: Negative for chills and fever.  HENT: Negative for congestion.   Eyes: Negative for visual disturbance.  Respiratory: Negative for shortness of breath.   Cardiovascular: Negative for chest pain.  Gastrointestinal: Negative for abdominal distention, abdominal pain, anal bleeding, constipation, diarrhea, nausea and vomiting.  Genitourinary: Positive for flank pain. Negative for difficulty urinating, dysuria and frequency.  Skin: Negative for rash.  Neurological: Negative for headaches.    Physical Exam Updated Vital Signs BP 117/77 (BP Location: Right Arm)   Pulse 68   Temp 98 F (36.7 C) (Oral)   Resp 20   Ht 6\' 1"  (1.854 m)   Wt 118.4 kg   SpO2 97%   BMI 34.43  kg/m   Physical Exam Vitals and nursing note reviewed.  Constitutional:      Appearance: Normal appearance.  HENT:     Head: Normocephalic.     Mouth/Throat:     Mouth: Mucous membranes are moist.  Cardiovascular:     Rate and Rhythm: Normal rate.  Pulmonary:     Effort: Pulmonary effort is normal. No respiratory distress.  Abdominal:     General: There is no distension.     Palpations: Abdomen is soft. There is no mass.     Tenderness: There is no right CVA tenderness or left CVA tenderness.     Hernia: No hernia is present.     Comments: Patient appears uncomfortable  diffusely with palpation of the abdomen but verbally states that it is nontender  Musculoskeletal:     Comments: Mild reproducible bilateral lower back discomfort on palpation  Skin:    General: Skin is warm.  Neurological:     Mental Status: He is alert and oriented to person, place, and time. Mental status is at baseline.  Psychiatric:        Mood and Affect: Mood normal.     ED Results / Procedures / Treatments   Labs (all labs ordered are listed, but only abnormal results are displayed) Labs Reviewed  CBC WITH DIFFERENTIAL/PLATELET  COMPREHENSIVE METABOLIC PANEL  URINALYSIS, ROUTINE W REFLEX MICROSCOPIC    EKG None  Radiology No results found.  Procedures Procedures   Medications Ordered in ED Medications  sodium chloride 0.9 % bolus 500 mL (has no administration in time range)  morphine 4 MG/ML injection 4 mg (has no administration in time range)    ED Course  I have reviewed the triage vital signs and the nursing notes.  Pertinent labs & imaging results that were available during my care of the patient were reviewed by me and considered in my medical decision making (see chart for details).    MDM Rules/Calculators/A&P                          55 year old male presents to the emergency department with bilateral lower back/flank pain.  Patient had a colonoscopy yesterday.  He is afebrile with normal vitals.  Abdomen is benign,he denies abdominal pain, the lower back is slightly uncomfortable to palpation. No weakness/numbness of BLE. No midline spinal tenderness.  Blood work is reassuring, no acute abnormalities.  CAT scan of the abdomen pelvis shows no abnormal findings.  I believe the patient is suffering from musculoskeletal pain stemming from heavy lifting at work.  He is neuro intact.  No abnormal spine findings on the CAT scan.  We will treat symptomatically.  Patient will be discharged and treated as an outpatient.  Discharge plan and strict return to ED  precautions discussed, patient verbalizes understanding and agreement.  Final Clinical Impression(s) / ED Diagnoses Final diagnoses:  None    Rx / DC Orders ED Discharge Orders    None       Lorelle Gibbs, DO 07/11/20 0323    Lorelle Gibbs, DO 07/11/20 0413

## 2020-07-10 NOTE — ED Triage Notes (Signed)
Pt reports he had a colonoscopy with 6-7 polyps removed yesterday; pt c/o severe bilateral flank pain that is constant started today

## 2020-07-11 ENCOUNTER — Emergency Department (HOSPITAL_COMMUNITY): Payer: Medicare Other

## 2020-07-11 DIAGNOSIS — R109 Unspecified abdominal pain: Secondary | ICD-10-CM | POA: Diagnosis not present

## 2020-07-11 LAB — CBC WITH DIFFERENTIAL/PLATELET
Abs Immature Granulocytes: 0.01 10*3/uL (ref 0.00–0.07)
Basophils Absolute: 0 10*3/uL (ref 0.0–0.1)
Basophils Relative: 1 %
Eosinophils Absolute: 0.1 10*3/uL (ref 0.0–0.5)
Eosinophils Relative: 3 %
HCT: 41.6 % (ref 39.0–52.0)
Hemoglobin: 14 g/dL (ref 13.0–17.0)
Immature Granulocytes: 0 %
Lymphocytes Relative: 44 %
Lymphs Abs: 1.7 10*3/uL (ref 0.7–4.0)
MCH: 31.4 pg (ref 26.0–34.0)
MCHC: 33.7 g/dL (ref 30.0–36.0)
MCV: 93.3 fL (ref 80.0–100.0)
Monocytes Absolute: 0.4 10*3/uL (ref 0.1–1.0)
Monocytes Relative: 12 %
Neutro Abs: 1.5 10*3/uL — ABNORMAL LOW (ref 1.7–7.7)
Neutrophils Relative %: 40 %
Platelets: 187 10*3/uL (ref 150–400)
RBC: 4.46 MIL/uL (ref 4.22–5.81)
RDW: 12.6 % (ref 11.5–15.5)
WBC: 3.8 10*3/uL — ABNORMAL LOW (ref 4.0–10.5)
nRBC: 0 % (ref 0.0–0.2)

## 2020-07-11 LAB — COMPREHENSIVE METABOLIC PANEL
ALT: 22 U/L (ref 0–44)
AST: 23 U/L (ref 15–41)
Albumin: 3.6 g/dL (ref 3.5–5.0)
Alkaline Phosphatase: 76 U/L (ref 38–126)
Anion gap: 8 (ref 5–15)
BUN: 11 mg/dL (ref 6–20)
CO2: 29 mmol/L (ref 22–32)
Calcium: 8.8 mg/dL — ABNORMAL LOW (ref 8.9–10.3)
Chloride: 99 mmol/L (ref 98–111)
Creatinine, Ser: 1 mg/dL (ref 0.61–1.24)
GFR, Estimated: >60 mL/min (ref >60)
Glucose, Bld: 104 mg/dL — ABNORMAL HIGH (ref 70–99)
Potassium: 3.2 mmol/L — ABNORMAL LOW (ref 3.5–5.1)
Sodium: 136 mmol/L (ref 135–145)
Total Bilirubin: 1 mg/dL (ref 0.3–1.2)
Total Protein: 7.4 g/dL (ref 6.5–8.1)

## 2020-07-11 LAB — URINALYSIS, ROUTINE W REFLEX MICROSCOPIC
Bilirubin Urine: NEGATIVE
Glucose, UA: NEGATIVE mg/dL
Hgb urine dipstick: NEGATIVE
Ketones, ur: NEGATIVE mg/dL
Leukocytes,Ua: NEGATIVE
Nitrite: NEGATIVE
Protein, ur: NEGATIVE mg/dL
Specific Gravity, Urine: 1.011 (ref 1.005–1.030)
pH: 8 (ref 5.0–8.0)

## 2020-07-11 MED ORDER — NAPROXEN 375 MG PO TABS: 375.0000 mg | ORAL_TABLET | Freq: Two times a day (BID) | ORAL | 0 refills | Status: DC

## 2020-07-11 MED ORDER — CYCLOBENZAPRINE HCL 10 MG PO TABS: 10.0000 mg | ORAL_TABLET | Freq: Two times a day (BID) | ORAL | 0 refills | Status: DC | PRN

## 2020-07-11 MED ORDER — IOHEXOL 300 MG/ML  SOLN
100.0000 mL | Freq: Once | INTRAMUSCULAR | Status: AC | PRN
  Administered 2020-07-11: 100 mL via INTRAVENOUS

## 2020-07-11 MED ORDER — HYDROCODONE-ACETAMINOPHEN 5-325 MG PO TABS
1.5000 | ORAL_TABLET | Freq: Once | ORAL | Status: AC
Start: 2020-07-11 — End: 2020-07-11
  Administered 2020-07-11: 1.5 via ORAL
  Filled 2020-07-11: qty 2

## 2020-07-11 MED ORDER — HYDROCODONE-ACETAMINOPHEN 7.5-325 MG PO TABS: 1.0000 | ORAL_TABLET | Freq: Once | ORAL | Status: DC

## 2020-07-11 NOTE — Discharge Instructions (Signed)
You have been seen and discharged from the emergency department.  Your blood work was normal, your CAT scan showed no acute finding.  I believe that you are suffering from musculoskeletal pain.  Use pain medicine and muscle relaxer as prescribed.  Do not mix this muscle relaxer medication with alcohol or other sedating medications. Do not drive or do heavy physical activity and to know how this medication affects you.  It may cause drowsiness.  No heavy lifting until you have recovered.  Follow-up with your primary provider for reevaluation and further care. Take home medications as prescribed. If you have any worsening symptoms or further concerns for your health please return to an emergency department for further evaluation.

## 2020-07-11 NOTE — ED Notes (Signed)
Pt transported to CT at this time.

## 2020-07-11 NOTE — ED Notes (Signed)
Pt returned from CT °

## 2020-08-01 DIAGNOSIS — D72819 Decreased white blood cell count, unspecified: Secondary | ICD-10-CM | POA: Insufficient documentation

## 2021-01-09 DIAGNOSIS — M7540 Impingement syndrome of unspecified shoulder: Secondary | ICD-10-CM | POA: Insufficient documentation

## 2021-01-09 DIAGNOSIS — M792 Neuralgia and neuritis, unspecified: Secondary | ICD-10-CM | POA: Insufficient documentation

## 2021-01-23 ENCOUNTER — Emergency Department (HOSPITAL_COMMUNITY): Payer: Medicare Other

## 2021-01-23 ENCOUNTER — Other Ambulatory Visit: Payer: Self-pay

## 2021-01-23 ENCOUNTER — Encounter (HOSPITAL_COMMUNITY): Payer: Self-pay | Admitting: Emergency Medicine

## 2021-01-23 ENCOUNTER — Emergency Department (HOSPITAL_COMMUNITY)
Admission: EM | Admit: 2021-01-23 | Discharge: 2021-01-24 | Disposition: A | Discharge status: Home / Self Care | Payer: Medicare Other | Attending: Emergency Medicine | Admitting: Emergency Medicine

## 2021-01-23 DIAGNOSIS — R079 Chest pain, unspecified: Secondary | ICD-10-CM | POA: Diagnosis present

## 2021-01-23 DIAGNOSIS — Z85038 Personal history of other malignant neoplasm of large intestine: Secondary | ICD-10-CM | POA: Insufficient documentation

## 2021-01-23 DIAGNOSIS — I10 Essential (primary) hypertension: Secondary | ICD-10-CM | POA: Insufficient documentation

## 2021-01-23 DIAGNOSIS — R072 Precordial pain: Secondary | ICD-10-CM | POA: Insufficient documentation

## 2021-01-23 DIAGNOSIS — Z79899 Other long term (current) drug therapy: Secondary | ICD-10-CM | POA: Insufficient documentation

## 2021-01-23 LAB — CBC
HCT: 42.3 % (ref 39.0–52.0)
Hemoglobin: 14.5 g/dL (ref 13.0–17.0)
MCH: 31.4 pg (ref 26.0–34.0)
MCHC: 34.3 g/dL (ref 30.0–36.0)
MCV: 91.6 fL (ref 80.0–100.0)
Platelets: 197 10*3/uL (ref 150–400)
RBC: 4.62 MIL/uL (ref 4.22–5.81)
RDW: 12.7 % (ref 11.5–15.5)
WBC: 4.5 10*3/uL (ref 4.0–10.5)
nRBC: 0 % (ref 0.0–0.2)

## 2021-01-23 NOTE — ED Triage Notes (Signed)
Pt arrives to ED via [facility]. Cc cp left side. Cp comes and goes. Been going on for over 1 mos. Sharp pains after a lot of movement.  EMS VS stable. Hx of pulled muscle in chest wall. Pt denies n/v, diaphoresis.   4 bayer aspirins prior to arrival.

## 2021-01-23 NOTE — ED Notes (Signed)
Patient transported to X-ray 

## 2021-01-24 DIAGNOSIS — R072 Precordial pain: Secondary | ICD-10-CM | POA: Diagnosis not present

## 2021-01-24 LAB — BASIC METABOLIC PANEL
Anion gap: 9 (ref 5–15)
BUN: 16 mg/dL (ref 6–20)
CO2: 29 mmol/L (ref 22–32)
Calcium: 9.1 mg/dL (ref 8.9–10.3)
Chloride: 100 mmol/L (ref 98–111)
Creatinine, Ser: 1.17 mg/dL (ref 0.61–1.24)
GFR, Estimated: >60 mL/min (ref >60)
Glucose, Bld: 112 mg/dL — ABNORMAL HIGH (ref 70–99)
Potassium: 3.6 mmol/L (ref 3.5–5.1)
Sodium: 138 mmol/L (ref 135–145)

## 2021-01-24 LAB — TROPONIN I (HIGH SENSITIVITY)
Troponin I (High Sensitivity): 3 ng/L (ref <18)
Troponin I (High Sensitivity): 3 ng/L (ref <18)

## 2021-01-24 NOTE — Discharge Instructions (Signed)

## 2021-01-24 NOTE — ED Provider Notes (Signed)
[facility] EMERGENCY DEPARTMENT Provider Note   CSN: [id number] Arrival date & time: 01/23/21  2315     History Chief Complaint  Patient presents with   Chest Pain    Left side towards left shoulder    Joseph Pruitt is a 55 y.o. male.   Chest Pain Associated symptoms: no diaphoresis, no fever, no shortness of breath and no vomiting    HPI: A 55 year old patient with a history of hypertension, hypercholesterolemia and obesity presents for evaluation of chest pain. Initial onset of pain was more than 6 hours ago. The patient's chest pain is sharp and is not worse with exertion. The patient's chest pain is middle- or left-sided, is not well-localized, is not described as heaviness/pressure/tightness and does radiate to the arms/jaw/neck. The patient does not complain of nausea and denies diaphoresis. The patient has a family history of coronary artery disease in a first-degree relative with onset less than age 51. The patient has no history of stroke, has no history of peripheral artery disease, has not smoked in the past 90 days and denies any history of treated diabetes.  Patient reports he has had sharp left-sided chest pain for several months. Tonight  it seemed worse and his wife advised him to go to the ER.  No fever/vomiting.  No shortness of breath.  No diaphoresis. No known history of CAD/VTE.  No previous stroke.  He reports previous history of colon cancer that is in remission No pleuritic pain is reported Past Medical History:  Diagnosis Date   Cancer ([place])    colon cancer   Environmental allergies    High cholesterol    Hypertension    Stomach ulcer     Patient Active Problem List   Diagnosis Date Noted   Head ache 09/21/2013   Pain in joint, ankle and foot 01/20/2013   Left leg weakness 01/20/2013   CARPAL TUNNEL SYNDROME, HX OF 02/14/2009   MONONEURITIS OF UNSPECIFIED SITE 11/22/2008   SPONDYLOSIS, CERVICAL, WITH RADICULOPATHY 11/22/2008   TENDINITIS, RIGHT  WRIST 11/22/2008   NECK PAIN 08/14/2008    Past Surgical History:  Procedure Laterality Date   ACHILLES TENDON SURGERY     COLON RESECTION     HAND SURGERY     SPINE SURGERY         History reviewed. No pertinent family history.  Social History   Tobacco Use   Smoking status: Never   Smokeless tobacco: Never  Vaping Use   Vaping Use: Former  Substance Use Topics   Alcohol use: No   Drug use: No    Home Medications Prior to Admission medications   Medication Sig Start Date End Date Taking? Authorizing Provider  cyclobenzaprine (FLEXERIL) 10 MG tablet Take 1 tablet (10 mg total) by mouth 2 (two) times daily as needed for muscle spasms. 07/11/20   Horton, Drue Dun M, DO  fluticasone (FLONASE) 50 MCG/ACT nasal spray 1 spray by Each Nare route daily as needed for allergies 10/05/14 09/10/19  [provider]  hydrochlorothiazide (HYDRODIURIL) 25 MG tablet Take 25 mg by mouth daily.    [provider]  ibuprofen (ADVIL) 800 MG tablet Take 1 tablet (800 mg total) by mouth every 8 (eight) hours as needed for moderate pain. 09/10/19   Milton Ferguson, MD  naproxen (NAPROSYN) 375 MG tablet Take 1 tablet (375 mg total) by mouth 2 (two) times daily. 07/11/20   Horton, Alvin Critchley, DO  oxyCODONE-acetaminophen (PERCOCET/ROXICET) 5-325 MG tablet Take 1 tablet by mouth  every 6 (six) hours as needed for moderate pain or severe pain.  11/22/14   [provider]    Allergies    Patient has no allergy information on record.  Review of Systems   Review of Systems  Constitutional:  Negative for diaphoresis and fever.  Respiratory:  Negative for shortness of breath.   Cardiovascular:  Positive for chest pain.  Gastrointestinal:  Negative for vomiting.  All other systems reviewed and are negative.  Physical Exam Updated Vital Signs BP 136/84   Pulse (!) 57   Temp 97.9 F (36.6 C) (Oral)   Resp 12   Ht 1.854 m (6\' 1" )   Wt 117.5 kg   SpO2 94%   BMI 34.17 kg/m    Physical Exam CONSTITUTIONAL: Well developed/well nourished HEAD: Normocephalic/atraumatic EYES: EOMI ENMT: Mucous membranes moist NECK: supple no meningeal signs SPINE/BACK:entire spine nontender CV: S1/S2 noted, no murmurs/rubs/gallops noted LUNGS: Lungs are clear to auscultation bilaterally, no apparent distress Chest-left-sided chest wall tenderness ABDOMEN: soft, nontender, no rebound or guarding, bowel sounds noted throughout abdomen GU:no cva tenderness NEURO: Pt is awake/alert/appropriate, moves all extremitiesx4.  No facial droop.   EXTREMITIES: pulses normal/equal, full ROM, no lower extremity SKIN: warm, color normal PSYCH: no abnormalities of mood noted, alert and oriented to situation  ED Results / Procedures / Treatments   Labs (all labs ordered are listed, but only abnormal results are displayed) Labs Reviewed  BASIC METABOLIC PANEL - Abnormal; Notable for the following components:      Result Value   Glucose, Bld 112 (*)    All other components within normal limits  CBC  TROPONIN I (HIGH SENSITIVITY)  TROPONIN I (HIGH SENSITIVITY)    EKG EKG Interpretation  Date/Time:  Wednesday January 23 2021 23:26:44 EST Ventricular Rate:  66 PR Interval:  189 QRS Duration: 96 QT Interval:  449 QTC Calculation: 471 R Axis:   -43 Text Interpretation: Sinus rhythm Left anterior fascicular block Abnormal R-wave progression, late transition Left ventricular hypertrophy Borderline T abnormalities, anterior leads Confirmed by Ripley Fraise [phone]) on 01/23/2021 11:34:33 PM  Radiology DG Chest 2 View  Result Date: 01/24/2021 CLINICAL DATA:  Chest pain. EXAM: CHEST - 2 VIEW COMPARISON:  Chest radiograph dated 09/10/2019. FINDINGS: No focal consolidation, pleural effusion, pneumothorax. The cardiac silhouette is within normal limits. No acute osseous pathology. Cervical ACDF. IMPRESSION: No active cardiopulmonary disease. Electronically Signed   By: Anner Crete  M.D.   On: 01/24/2021 00:11    Procedures Procedures   Medications Ordered in ED Medications - No data to display  ED Course  I have reviewed the triage vital signs and the nursing notes.  Pertinent labs & imaging results that were available during my care of the patient were reviewed by me and considered in my medical decision making (see chart for details).    MDM Rules/Calculators/A&P HEAR Score: 4                         Patient presents with chest pain he has been having for months.  He reports it is reproducible at times.  Work-up in the emergency department was unremarkable.  Troponins are negative. I feel he is safe for discharge home. He can follow-up as an outpatient for further evaluation.  Given his history of pain for months that is reproducible, low suspicion for ACS/PE/dissection  Final Clinical Impression(s) / ED Diagnoses Final diagnoses:  Precordial pain    Rx / DC Orders  ED Discharge Orders     None        Ripley Fraise, MD 01/24/21 0330

## 2021-02-17 ENCOUNTER — Other Ambulatory Visit: Payer: Self-pay

## 2021-02-17 ENCOUNTER — Emergency Department (HOSPITAL_COMMUNITY)
Admission: EM | Admit: 2021-02-17 | Discharge: 2021-02-17 | Disposition: A | Discharge status: Home / Self Care | Payer: Medicare Other | Attending: Emergency Medicine | Admitting: Emergency Medicine

## 2021-02-17 ENCOUNTER — Encounter (HOSPITAL_COMMUNITY): Payer: Self-pay | Admitting: Emergency Medicine

## 2021-02-17 DIAGNOSIS — Z85038 Personal history of other malignant neoplasm of large intestine: Secondary | ICD-10-CM | POA: Insufficient documentation

## 2021-02-17 DIAGNOSIS — I10 Essential (primary) hypertension: Secondary | ICD-10-CM | POA: Diagnosis not present

## 2021-02-17 DIAGNOSIS — J040 Acute laryngitis: Secondary | ICD-10-CM | POA: Diagnosis not present

## 2021-02-17 DIAGNOSIS — Z20822 Contact with and (suspected) exposure to covid-19: Secondary | ICD-10-CM | POA: Insufficient documentation

## 2021-02-17 DIAGNOSIS — R490 Dysphonia: Secondary | ICD-10-CM | POA: Diagnosis present

## 2021-02-17 LAB — RESP PANEL BY RT-PCR (FLU A&B, COVID) ARPGX2
Influenza A by PCR: NEGATIVE
Influenza B by PCR: NEGATIVE
SARS Coronavirus 2 by RT PCR: NEGATIVE

## 2021-02-17 MED ORDER — DEXAMETHASONE 4 MG PO TABS
10.0000 mg | ORAL_TABLET | Freq: Once | ORAL | Status: AC
  Administered 2021-02-17: 10 mg via ORAL
  Filled 2021-02-17: qty 3

## 2021-02-17 NOTE — ED Triage Notes (Signed)
Pt to the ED with complaints of being hoarse with an intermittent cough for the past few days.

## 2021-02-17 NOTE — Discharge Instructions (Addendum)
Call your primary care doctor or specialist as discussed in the next 2-3 days.   Return immediately back to the ER if:  Your symptoms worsen within the next 12-24 hours. You develop new symptoms such as new fevers, persistent vomiting, new pain, shortness of breath, or new weakness or numbness, or if you have any other concerns.  

## 2021-02-17 NOTE — ED Provider Notes (Addendum)
[facility] EMERGENCY DEPARTMENT Provider Note   CSN: [id number] Arrival date & time: 02/17/21  7782     History Chief Complaint  Patient presents with   Hoarse    Joseph Pruitt is a 55 y.o. male.  Patient presents to ER chief complaint of hoarse voice.  Denies any specific pain.  Denies fevers.  Symptoms been ongoing for a week.  He states he has a "very mild cough."  Symptoms of a cough going on for a week as well.  Denies vomiting and diarrhea denies difficulty swallowing.      Past Medical History:  Diagnosis Date   [facility])    colon cancer   Environmental allergies    High cholesterol    Hypertension    Stomach ulcer     Patient Active Problem List   Diagnosis Date Noted   Head ache 09/21/2013   Pain in joint, ankle and foot 01/20/2013   Left leg weakness 01/20/2013   CARPAL TUNNEL SYNDROME, HX OF 02/14/2009   MONONEURITIS OF UNSPECIFIED SITE 11/22/2008   SPONDYLOSIS, CERVICAL, WITH RADICULOPATHY 11/22/2008   TENDINITIS, RIGHT WRIST 11/22/2008   NECK PAIN 08/14/2008    Past Surgical History:  Procedure Laterality Date   ACHILLES TENDON SURGERY     COLON RESECTION     HAND SURGERY     SPINE SURGERY         History reviewed. No pertinent family history.  Social History   Tobacco Use   Smoking status: Never   Smokeless tobacco: Never  Vaping Use   Vaping Use: Some days   Substances: Nicotine, Flavoring  Substance Use Topics   Alcohol use: No   Drug use: No    Home Medications Prior to Admission medications   Medication Sig Start Date End Date Taking? Authorizing Provider  cyclobenzaprine (FLEXERIL) 10 MG tablet Take 1 tablet (10 mg total) by mouth 2 (two) times daily as needed for muscle spasms. 07/11/20   Horton, Drue Dun M, DO  fluticasone (FLONASE) 50 MCG/ACT nasal spray 1 spray by Each Nare route daily as needed for allergies 10/05/14 09/10/19  [provider]  hydrochlorothiazide (HYDRODIURIL) 25 MG tablet Take 25 mg by mouth  daily.    [provider]  ibuprofen (ADVIL) 800 MG tablet Take 1 tablet (800 mg total) by mouth every 8 (eight) hours as needed for moderate pain. 09/10/19   Milton Ferguson, MD  naproxen (NAPROSYN) 375 MG tablet Take 1 tablet (375 mg total) by mouth 2 (two) times daily. 07/11/20   Horton, Alvin Critchley, DO  oxyCODONE-acetaminophen (PERCOCET/ROXICET) 5-325 MG tablet Take 1 tablet by mouth every 6 (six) hours as needed for moderate pain or severe pain.  11/22/14   [provider]    Allergies    Patient has no allergy information on record.  Review of Systems   Review of Systems  Constitutional:  Negative for fever.  HENT:  Negative for ear pain and sore throat.   Eyes:  Negative for pain.  Respiratory:  Positive for cough.   Cardiovascular:  Negative for chest pain.  Gastrointestinal:  Negative for abdominal pain.  Genitourinary:  Negative for flank pain.  Musculoskeletal:  Negative for back pain.  Skin:  Negative for color change and rash.  Neurological:  Negative for syncope.  All other systems reviewed and are negative.  Physical Exam Updated Vital Signs BP (!) 137/100   Pulse 70   Temp 98.4 F (36.9 C) (Oral)   Resp 17  Ht 6\' 1"  (1.854 m)   Wt 117.5 kg   SpO2 95%   BMI 34.17 kg/m   Physical Exam Constitutional:      Appearance: He is well-developed.  HENT:     Head: Normocephalic.     Nose: Nose normal.     Mouth/Throat:     Mouth: Mucous membranes are moist.     Pharynx: Oropharynx is clear. No oropharyngeal exudate or posterior oropharyngeal erythema.     Comments: Uvula midline. Eyes:     Extraocular Movements: Extraocular movements intact.  Cardiovascular:     Rate and Rhythm: Normal rate.  Pulmonary:     Effort: Pulmonary effort is normal.  Musculoskeletal:     Cervical back: Normal range of motion and neck supple.  Lymphadenopathy:     Cervical: No cervical adenopathy.  Skin:    Coloration: Skin is not jaundiced.  Neurological:     Mental  Status: He is alert. Mental status is at baseline.    ED Results / Procedures / Treatments   Labs (all labs ordered are listed, but only abnormal results are displayed) Labs Reviewed  RESP PANEL BY RT-PCR (FLU A&B, COVID) ARPGX2    EKG None  Radiology No results found.  Procedures Procedures   Medications Ordered in ED Medications  dexamethasone (DECADRON) tablet 10 mg (has no administration in time range)    ED Course  I have reviewed the triage vital signs and the nursing notes.  Pertinent labs & imaging results that were available during my care of the patient were reviewed by me and considered in my medical decision making (see chart for details).    MDM Rules/Calculators/A&P                           Patient is clinically well-appearing.  COVID test was sent pending final results.  Etiology of his hoarse voice likely due to viral infection.  Advised patient to follow-up with ENT should his symptoms last more than 2 weeks.  Patient states understanding.  Discharged home in stable condition.  Final Clinical Impression(s) / ED Diagnoses Final diagnoses:  Laryngitis    Rx / DC Orders ED Discharge Orders     None        Luna Fuse, MD 02/17/21 1012    Luna Fuse, MD 02/17/21 1013

## 2021-04-14 ENCOUNTER — Encounter (HOSPITAL_COMMUNITY): Payer: Self-pay

## 2021-04-14 ENCOUNTER — Emergency Department (HOSPITAL_COMMUNITY): Payer: Medicare Other

## 2021-04-14 ENCOUNTER — Emergency Department (HOSPITAL_COMMUNITY)
Admission: EM | Admit: 2021-04-14 | Discharge: 2021-04-14 | Disposition: A | Discharge status: Home / Self Care | Payer: Medicare Other | Attending: Emergency Medicine | Admitting: Emergency Medicine

## 2021-04-14 ENCOUNTER — Other Ambulatory Visit: Payer: Self-pay

## 2021-04-14 DIAGNOSIS — Z20822 Contact with and (suspected) exposure to covid-19: Secondary | ICD-10-CM | POA: Diagnosis not present

## 2021-04-14 DIAGNOSIS — R0789 Other chest pain: Secondary | ICD-10-CM | POA: Diagnosis present

## 2021-04-14 DIAGNOSIS — R0781 Pleurodynia: Secondary | ICD-10-CM

## 2021-04-14 DIAGNOSIS — Z85038 Personal history of other malignant neoplasm of large intestine: Secondary | ICD-10-CM | POA: Diagnosis not present

## 2021-04-14 DIAGNOSIS — R7309 Other abnormal glucose: Secondary | ICD-10-CM | POA: Diagnosis not present

## 2021-04-14 DIAGNOSIS — I10 Essential (primary) hypertension: Secondary | ICD-10-CM | POA: Diagnosis not present

## 2021-04-14 LAB — CBC WITH DIFFERENTIAL/PLATELET
Abs Immature Granulocytes: 0.01 10*3/uL (ref 0.00–0.07)
Basophils Absolute: 0 10*3/uL (ref 0.0–0.1)
Basophils Relative: 1 %
Eosinophils Absolute: 0.1 10*3/uL (ref 0.0–0.5)
Eosinophils Relative: 2 %
HCT: 44.2 % (ref 39.0–52.0)
Hemoglobin: 14.9 g/dL (ref 13.0–17.0)
Immature Granulocytes: 0 %
Lymphocytes Relative: 49 %
Lymphs Abs: 1.7 10*3/uL (ref 0.7–4.0)
MCH: 31 pg (ref 26.0–34.0)
MCHC: 33.7 g/dL (ref 30.0–36.0)
MCV: 92.1 fL (ref 80.0–100.0)
Monocytes Absolute: 0.4 10*3/uL (ref 0.1–1.0)
Monocytes Relative: 11 %
Neutro Abs: 1.3 10*3/uL — ABNORMAL LOW (ref 1.7–7.7)
Neutrophils Relative %: 37 %
Platelets: 200 10*3/uL (ref 150–400)
RBC: 4.8 MIL/uL (ref 4.22–5.81)
RDW: 12.5 % (ref 11.5–15.5)
WBC: 3.4 10*3/uL — ABNORMAL LOW (ref 4.0–10.5)
nRBC: 0 % (ref 0.0–0.2)

## 2021-04-14 LAB — RESP PANEL BY RT-PCR (FLU A&B, COVID) ARPGX2
Influenza A by PCR: NEGATIVE
Influenza B by PCR: NEGATIVE
SARS Coronavirus 2 by RT PCR: NEGATIVE

## 2021-04-14 LAB — COMPREHENSIVE METABOLIC PANEL
ALT: 22 U/L (ref 0–44)
AST: 26 U/L (ref 15–41)
Albumin: 3.7 g/dL (ref 3.5–5.0)
Alkaline Phosphatase: 87 U/L (ref 38–126)
Anion gap: 10 (ref 5–15)
BUN: 16 mg/dL (ref 6–20)
CO2: 25 mmol/L (ref 22–32)
Calcium: 9 mg/dL (ref 8.9–10.3)
Chloride: 99 mmol/L (ref 98–111)
Creatinine, Ser: 1 mg/dL (ref 0.61–1.24)
GFR, Estimated: >60 mL/min (ref >60)
Glucose, Bld: 156 mg/dL — ABNORMAL HIGH (ref 70–99)
Potassium: 3.4 mmol/L — ABNORMAL LOW (ref 3.5–5.1)
Sodium: 134 mmol/L — ABNORMAL LOW (ref 135–145)
Total Bilirubin: 0.6 mg/dL (ref 0.3–1.2)
Total Protein: 7.7 g/dL (ref 6.5–8.1)

## 2021-04-14 LAB — LIPASE, BLOOD: Lipase: 33 U/L (ref 11–51)

## 2021-04-14 LAB — MAGNESIUM: Magnesium: 1.7 mg/dL (ref 1.7–2.4)

## 2021-04-14 LAB — PROTIME-INR
INR: 0.9 (ref 0.8–1.2)
Prothrombin Time: 12.4 seconds (ref 11.4–15.2)

## 2021-04-14 LAB — TROPONIN I (HIGH SENSITIVITY)
Troponin I (High Sensitivity): 3 ng/L (ref <18)
Troponin I (High Sensitivity): 3 ng/L (ref <18)

## 2021-04-14 LAB — CBG MONITORING, ED: Glucose-Capillary: 130 mg/dL — ABNORMAL HIGH (ref 70–99)

## 2021-04-14 MED ORDER — ASPIRIN 81 MG PO CHEW
324.0000 mg | CHEWABLE_TABLET | Freq: Once | ORAL | Status: AC
  Administered 2021-04-14: 324 mg via ORAL
  Filled 2021-04-14: qty 4

## 2021-04-14 MED ORDER — IOHEXOL 350 MG/ML SOLN
75.0000 mL | Freq: Once | INTRAVENOUS | Status: AC | PRN
  Administered 2021-04-14: 75 mL via INTRAVENOUS

## 2021-04-14 MED ORDER — LACTATED RINGERS IV BOLUS
500.0000 mL | Freq: Once | INTRAVENOUS | Status: AC
  Administered 2021-04-14: 500 mL via INTRAVENOUS

## 2021-04-14 MED ORDER — DIAZEPAM 5 MG/ML IJ SOLN
5.0000 mg | Freq: Once | INTRAMUSCULAR | Status: AC
  Administered 2021-04-14: 5 mg via INTRAVENOUS
  Filled 2021-04-14: qty 2

## 2021-04-14 MED ORDER — NITROGLYCERIN 2 % TD OINT
1.0000 [in_us] | TOPICAL_OINTMENT | Freq: Once | TRANSDERMAL | Status: AC
  Administered 2021-04-14: 1 [in_us] via TOPICAL
  Filled 2021-04-14: qty 1

## 2021-04-14 NOTE — ED Provider Notes (Signed)
[facility] EMERGENCY DEPARTMENT Provider Note   CSN: [id number] Arrival date & time: 04/14/21  3614     History  Chief Complaint  Patient presents with   Chest Pain    Joseph Pruitt is a 56 y.o. male.   Chest Pain Pain location:  L lateral chest Pain quality: sharp   Pain radiates to:  Does not radiate Pain severity:  Moderate Onset quality:  Gradual Duration:  2 days Timing:  Intermittent Progression:  Worsening Chronicity:  Recurrent Context: breathing and stress   Context: not trauma   Patient presenting for left lower lateral chest wall pain.  He states that he has had similar pain in the past and it appears to recur when he has stress.  Over the last 2 days, he has had increasing stress with conversations with his significant other.  With these stressful episodes, he has had pain in the left lateral lower chest wall.  He is also experienced pain in the left side of his neck.  Pain is waxed and waned in severity over the past 2 days.  He denies any associated shortness of breath, diaphoresis, or nausea.  Currently, pain is mild.  He has no known cardiac history.  He does have a remote history of colon cancer which has been in remission for the past 5 years. HPI: A 56 year old patient with a history of hypertension, hypercholesterolemia and obesity presents for evaluation of chest pain. Initial onset of pain was less than one hour ago. The patient's chest pain is sharp and is not worse with exertion. The patient's chest pain is middle- or left-sided, is not well-localized, is not described as heaviness/pressure/tightness and does radiate to the arms/jaw/neck. The patient does not complain of nausea and denies diaphoresis. The patient has no history of stroke, has no history of peripheral artery disease, has not smoked in the past 90 days, denies any history of treated diabetes and has no relevant family history of coronary artery disease (first degree relative at less than age 38).    Home Medications Prior to Admission medications   Medication Sig Start Date End Date Taking? Authorizing Provider  cyclobenzaprine (FLEXERIL) 10 MG tablet Take 1 tablet (10 mg total) by mouth 2 (two) times daily as needed for muscle spasms. 07/11/20   Horton, Drue Dun M, DO  fluticasone (FLONASE) 50 MCG/ACT nasal spray 1 spray by Each Nare route daily as needed for allergies 10/05/14 09/10/19  [provider]  hydrochlorothiazide (HYDRODIURIL) 25 MG tablet Take 25 mg by mouth daily.    [provider]  ibuprofen (ADVIL) 800 MG tablet Take 1 tablet (800 mg total) by mouth every 8 (eight) hours as needed for moderate pain. 09/10/19   Milton Ferguson, MD  naproxen (NAPROSYN) 375 MG tablet Take 1 tablet (375 mg total) by mouth 2 (two) times daily. 07/11/20   Horton, Alvin Critchley, DO  oxyCODONE-acetaminophen (PERCOCET/ROXICET) 5-325 MG tablet Take 1 tablet by mouth every 6 (six) hours as needed for moderate pain or severe pain.  11/22/14   [provider]      Allergies    Patient has no known allergies.    Review of Systems   Review of Systems  Cardiovascular:  Positive for chest pain (Left lateral chest wall).  Musculoskeletal:  Positive for neck pain (Left-sided).  All other systems reviewed and are negative.  Physical Exam Updated Vital Signs BP 124/69    Pulse (!) 58    Temp 98.2 F (36.8 C) (Oral)  Resp 14    Ht 6\' 1"  (1.854 m)    Wt 116 kg    SpO2 96%    BMI 33.74 kg/m  Physical Exam Vitals and nursing note reviewed.  Constitutional:      General: He is not in acute distress.    Appearance: He is well-developed. He is not ill-appearing, toxic-appearing or diaphoretic.  HENT:     Head: Normocephalic and atraumatic.  Eyes:     Extraocular Movements: Extraocular movements intact.     Conjunctiva/sclera: Conjunctivae normal.  Neck:     Vascular: No JVD.  Cardiovascular:     Rate and Rhythm: Normal rate and regular rhythm.     Heart sounds: No murmur  heard. Pulmonary:     Effort: Pulmonary effort is normal. No respiratory distress.     Breath sounds: Normal breath sounds. No decreased breath sounds, wheezing, rhonchi or rales.  Chest:     Chest wall: Tenderness present.  Abdominal:     Palpations: Abdomen is soft.     Tenderness: There is no abdominal tenderness.  Musculoskeletal:        General: No swelling.     Cervical back: Neck supple.     Right lower leg: No edema.     Left lower leg: No edema.  Skin:    General: Skin is warm and dry.     Capillary Refill: Capillary refill takes less than 2 seconds.     Coloration: Skin is not cyanotic or pale.  Neurological:     General: No focal deficit present.     Mental Status: He is alert and oriented to person, place, and time.     Cranial Nerves: No cranial nerve deficit.     Motor: No weakness.  Psychiatric:        Mood and Affect: Mood normal.        Behavior: Behavior normal.    ED Results / Procedures / Treatments   Labs (all labs ordered are listed, but only abnormal results are displayed) Labs Reviewed  COMPREHENSIVE METABOLIC PANEL - Abnormal; Notable for the following components:      Result Value   Sodium 134 (*)    Potassium 3.4 (*)    Glucose, Bld 156 (*)    All other components within normal limits  CBC WITH DIFFERENTIAL/PLATELET - Abnormal; Notable for the following components:   WBC 3.4 (*)    Neutro Abs 1.3 (*)    All other components within normal limits  CBG MONITORING, ED - Abnormal; Notable for the following components:   Glucose-Capillary 130 (*)    All other components within normal limits  RESP PANEL BY RT-PCR (FLU A&B, COVID) ARPGX2  MAGNESIUM  LIPASE, BLOOD  PROTIME-INR  TROPONIN I (HIGH SENSITIVITY)  TROPONIN I (HIGH SENSITIVITY)    EKG EKG Interpretation  Date/Time:  Sunday April 14 2021 08:45:29 EST Ventricular Rate:  67 PR Interval:  183 QRS Duration: 100 QT Interval:  467 QTC Calculation: 493 R Axis:   15 Text  Interpretation: Sinus rhythm Abnormal R-wave progression, early transition Borderline prolonged QT interval Confirmed by Godfrey Pick ([id number]) on 04/14/2021 9:27:53 AM  Radiology CT Angio Chest PE W and/or Wo Contrast  Result Date: 04/14/2021 CLINICAL DATA:  Left-sided chest pain for 2 days. EXAM: CT ANGIOGRAPHY CHEST WITH CONTRAST TECHNIQUE: Multidetector CT imaging of the chest was performed using the standard protocol during bolus administration of intravenous contrast. Multiplanar CT image reconstructions and MIPs were obtained to evaluate the vascular anatomy.  RADIATION DOSE REDUCTION: This exam was performed according to the departmental dose-optimization program which includes automated exposure control, adjustment of the mA and/or kV according to patient size and/or use of iterative reconstruction technique. CONTRAST:  41mL OMNIPAQUE IOHEXOL 350 MG/ML SOLN COMPARISON:  None. FINDINGS: Cardiovascular: The heart size is normal. No substantial pericardial effusion. Mild atherosclerotic calcification is noted in the wall of the thoracic aorta. There is no filling defect within the opacified pulmonary arteries to suggest the presence of an acute pulmonary embolus. Mediastinum/Nodes: No mediastinal lymphadenopathy. There is no hilar lymphadenopathy. The esophagus has normal imaging features. There is no axillary lymphadenopathy. Lungs/Pleura: No suspicious pulmonary nodule or mass. No focal airspace consolidation. No pleural effusion. Upper Abdomen: Unremarkable. Musculoskeletal: No worrisome lytic or sclerotic osseous abnormality. Review of the MIP images confirms the above findings. IMPRESSION: 1. No CT evidence for acute pulmonary embolus. 2. No acute findings in the chest. 3. Aortic Atherosclerosis (ICD10-I70.0). Electronically Signed   By: Misty Stanley M.D.   On: 04/14/2021 10:56   DG Chest Portable 1 View  Result Date: 04/14/2021 CLINICAL DATA:  Left-sided chest pain for 2 days. EXAM: PORTABLE CHEST 1 VIEW  COMPARISON:  01/23/2021 FINDINGS: 0940 hours. Low volume film. Cardiopericardial silhouette is at upper limits of normal for size. There is pulmonary vascular congestion without overt pulmonary edema. The visualized bony structures of the thorax show no acute abnormality. Telemetry leads overlie the chest. IMPRESSION: Low volume film with vascular congestion. Electronically Signed   By: Misty Stanley M.D.   On: 04/14/2021 10:03    Procedures Procedures    Medications Ordered in ED Medications  aspirin chewable tablet 324 mg (324 mg Oral Given 04/14/21 0859)  nitroGLYCERIN (NITROGLYN) 2 % ointment 1 inch (1 inch Topical Given 04/14/21 0859)  diazepam (VALIUM) injection 5 mg (5 mg Intravenous Given 04/14/21 0911)  lactated ringers bolus 500 mL (0 mLs Intravenous Stopped 04/14/21 1227)  iohexol (OMNIPAQUE) 350 MG/ML injection 75 mL (75 mLs Intravenous Contrast Given 04/14/21 1035)    ED Course/ Medical Decision Making/ A&P   HEAR Score: 3                       Medical Decision Making Amount and/or Complexity of Data Reviewed Labs: ordered. Radiology: ordered.  Risk OTC drugs. Prescription drug management.   This patient presents to the ED for concern of chest pain, this involves an extensive number of treatment options, and is a complaint that carries with it a high risk of complications and morbidity.  The differential diagnosis includes ACS, PE, pneumonia, musculoskeletal pain   Co morbidities that complicate the patient evaluation  History of colon cancer, HTN, HLD   Additional history obtained:  Additional history obtained from N/A External records from outside source obtained and reviewed including EMR   Lab Tests:  I Ordered, and personally interpreted labs.  The pertinent results include: Normal electrolytes, normal troponins, baseline leukopenia, normal hemoglobin   Imaging Studies ordered:  I ordered imaging studies including chest x-ray, CTA chest I independently  visualized and interpreted imaging which showed no acute findings I agree with the radiologist interpretation   Cardiac Monitoring:  The patient was maintained on a cardiac monitor.  I personally viewed and interpreted the cardiac monitored which showed an underlying rhythm of: Sinus rhythm   Medicines ordered and prescription drug management:  I ordered medication including NTG and ASA for empiric treatment of ACS, Valium for muscle relaxant Reevaluation of the patient after  these medicines showed that the patient resolved I have reviewed the patients home medicines and have made adjustments as needed   Problem List / ED Course:  Patient presenting for left-sided chest wall pain.  EKG shows no evidence of ischemia.  Based on symptoms, risk factors, and EKG findings, patient is heart score of 3.  Vital signs are notable for moderate hypertension.  On exam, patient is well-appearing.  The area of pain does have associated tenderness, although he denies any recent injuries.  He does state that pain is worsened with social stressors.  324 of ASA and NTG were given.  Patient's initial laboratory work-up was reassuring with normal troponin.  On chart review, it does appear the patient was recently treated for community-acquired pneumonia.  He denies any persistence of cough or sputum production.  Labs shows no leukocytosis.  Valium was given for treatment of musculoskeletal etiology.  Given concern of PE, CTA of chest was ordered.  CTA of chest showed no evidence of PE and no other acute findings, including no evidence of persistent pneumonia.  On reassessment, patient had resolution of symptoms.  He is stable for discharge at this time.   Reevaluation:  After the interventions noted above, I reevaluated the patient and found that they have :resolved   Social Determinants of Health:  Patient has access to outpatient medical care, including a primary care doctor.   Dispostion:  After  consideration of the diagnostic results and the patients response to treatment, I feel that the patent would benefit from discharge.          Final Clinical Impression(s) / ED Diagnoses Final diagnoses:  Rib pain on left side    Rx / DC Orders ED Discharge Orders     None         Godfrey Pick, MD 04/14/21 [phone]

## 2021-04-14 NOTE — ED Triage Notes (Signed)
Patient states left sided chest pain for 2 days without any other symptoms. Patient states stress makes chest pain worse.

## 2021-04-14 NOTE — ED Notes (Signed)
ED Provider at bedside. 

## 2021-05-01 ENCOUNTER — Encounter (HOSPITAL_COMMUNITY): Payer: Self-pay

## 2021-05-01 ENCOUNTER — Emergency Department (HOSPITAL_COMMUNITY): Payer: Medicare Other

## 2021-05-01 ENCOUNTER — Other Ambulatory Visit: Payer: Self-pay

## 2021-05-01 ENCOUNTER — Emergency Department (HOSPITAL_COMMUNITY)
Admission: EM | Admit: 2021-05-01 | Discharge: 2021-05-01 | Disposition: A | Discharge status: Home / Self Care | Payer: Medicare Other | Attending: Emergency Medicine | Admitting: Emergency Medicine

## 2021-05-01 DIAGNOSIS — Z79899 Other long term (current) drug therapy: Secondary | ICD-10-CM | POA: Diagnosis not present

## 2021-05-01 DIAGNOSIS — Y92410 Unspecified street and highway as the place of occurrence of the external cause: Secondary | ICD-10-CM | POA: Diagnosis not present

## 2021-05-01 DIAGNOSIS — S62245A Nondisplaced fracture of shaft of first metacarpal bone, left hand, initial encounter for closed fracture: Secondary | ICD-10-CM | POA: Diagnosis not present

## 2021-05-01 DIAGNOSIS — S6992XA Unspecified injury of left wrist, hand and finger(s), initial encounter: Secondary | ICD-10-CM | POA: Diagnosis present

## 2021-05-01 DIAGNOSIS — Z7982 Long term (current) use of aspirin: Secondary | ICD-10-CM | POA: Diagnosis not present

## 2021-05-01 DIAGNOSIS — S62235A Other nondisplaced fracture of base of first metacarpal bone, left hand, initial encounter for closed fracture: Secondary | ICD-10-CM

## 2021-05-01 MED ORDER — IBUPROFEN 800 MG PO TABS
800.0000 mg | ORAL_TABLET | Freq: Once | ORAL | Status: AC
  Administered 2021-05-01: 800 mg via ORAL
  Filled 2021-05-01: qty 1
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
## 2021-05-03 ENCOUNTER — Encounter (HOSPITAL_COMMUNITY): Payer: Self-pay

## 2021-05-03 ENCOUNTER — Emergency Department (HOSPITAL_COMMUNITY)
Admission: EM | Admit: 2021-05-03 | Discharge: 2021-05-03 | Disposition: A | Discharge status: Home / Self Care | Payer: No Typology Code available for payment source | Source: Home / Self Care | Attending: Emergency Medicine | Admitting: Emergency Medicine

## 2021-05-03 ENCOUNTER — Emergency Department (HOSPITAL_COMMUNITY)
Admission: EM | Admit: 2021-05-03 | Discharge: 2021-05-03 | Disposition: A | Discharge status: Home / Self Care | Payer: No Typology Code available for payment source | Attending: Emergency Medicine | Admitting: Emergency Medicine

## 2021-05-03 ENCOUNTER — Other Ambulatory Visit: Payer: Self-pay

## 2021-05-03 DIAGNOSIS — S62292A Other fracture of first metacarpal bone, left hand, initial encounter for closed fracture: Secondary | ICD-10-CM | POA: Insufficient documentation

## 2021-05-03 DIAGNOSIS — Z7982 Long term (current) use of aspirin: Secondary | ICD-10-CM | POA: Insufficient documentation

## 2021-05-03 DIAGNOSIS — M79642 Pain in left hand: Secondary | ICD-10-CM | POA: Diagnosis present

## 2021-05-03 DIAGNOSIS — Y92410 Unspecified street and highway as the place of occurrence of the external cause: Secondary | ICD-10-CM | POA: Insufficient documentation

## 2021-05-03 DIAGNOSIS — Z5321 Procedure and treatment not carried out due to patient leaving prior to being seen by health care provider: Secondary | ICD-10-CM | POA: Insufficient documentation
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
## 2021-05-03 NOTE — Discharge Instructions (Signed)
Follow up with Orthopaedist as directed
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
## 2021-05-08 ENCOUNTER — Other Ambulatory Visit: Payer: Self-pay

## 2021-05-08 ENCOUNTER — Encounter: Payer: Self-pay | Admitting: Orthopedic Surgery

## 2021-05-08 ENCOUNTER — Ambulatory Visit (INDEPENDENT_AMBULATORY_CARE_PROVIDER_SITE_OTHER): Payer: Medicare Other | Admitting: Orthopedic Surgery

## 2021-05-08 VITALS — BP 155/103 | HR 62 | Ht 73.0 in | Wt 261.0 lb

## 2021-05-08 DIAGNOSIS — S62235A Other nondisplaced fracture of base of first metacarpal bone, left hand, initial encounter for closed fracture: Secondary | ICD-10-CM | POA: Diagnosis not present

## 2021-05-08 NOTE — Patient Instructions (Signed)
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
## 2021-05-21 ENCOUNTER — Other Ambulatory Visit: Payer: Self-pay

## 2021-05-21 ENCOUNTER — Encounter (HOSPITAL_COMMUNITY): Payer: Self-pay

## 2021-05-21 ENCOUNTER — Emergency Department (HOSPITAL_COMMUNITY)
Admission: EM | Admit: 2021-05-21 | Discharge: 2021-05-21 | Disposition: A | Discharge status: Home / Self Care | Payer: Medicare Other | Attending: Student | Admitting: Student

## 2021-05-21 DIAGNOSIS — M79671 Pain in right foot: Secondary | ICD-10-CM | POA: Insufficient documentation

## 2021-05-21 DIAGNOSIS — Z981 Arthrodesis status: Secondary | ICD-10-CM | POA: Diagnosis not present

## 2021-05-21 DIAGNOSIS — R202 Paresthesia of skin: Secondary | ICD-10-CM | POA: Diagnosis present

## 2021-05-21 DIAGNOSIS — M25512 Pain in left shoulder: Secondary | ICD-10-CM | POA: Insufficient documentation

## 2021-05-21 MED ORDER — PREDNISONE 10 MG PO TABS: 30.0000 mg | ORAL_TABLET | Freq: Every day | ORAL | 0 refills | Status: AC

## 2021-05-21 MED ORDER — CYCLOBENZAPRINE HCL 10 MG PO TABS
10.0000 mg | ORAL_TABLET | Freq: Two times a day (BID) | ORAL | 0 refills | Status: AC | PRN
Start: 2021-05-21 — End: ?
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
## 2021-05-21 NOTE — Discharge Instructions (Addendum)
Shoulder pain-likely coming from your neck, I have started you on short course of steroids as well as a muscle laxer please take as prescribed, I recommend follow-up with neurosurgery for further evaluation. ?Right heel pain-likely you inflamed your Achilles tendon I recommend continue with ibuprofen and Tylenol, also given you steroids should help with your pain.  Please follow-up with your orthopedic doctor for further evaluation. ? ?

## 2021-05-21 NOTE — ED Notes (Signed)
Pt A&Ox4 ambulatory at d/c with independent steady gait, NAD. Pt verbalized understanding of d/c instructions, prescriptions and follow up care. ?

## 2021-05-21 NOTE — ED Provider Triage Note (Signed)
Emergency Medicine Provider Triage Evaluation Note ? ?Shade Flood , a 56 y.o. male  was evaluated in triage.  Pt complains of shoulder numbness left side right heel pain both gone for over a month's time. ? ?Review of Systems  ?Positive: Shoulder pain, right heel pain ?Negative: Weakness, bleeding ? ?Physical Exam  ?BP 122/84 (BP Location: Right Arm)   Pulse 81   Temp 98.2 ?F (36.8 ?C) (Oral)   Resp 18   Ht '6\' 1"'$  (1.854 m)   Wt 118.4 kg   SpO2 96%   BMI 34.43 kg/m?  ?Gen:   Awake, no distress   ?Resp:  Normal effort  ?MSK:   Moves extremities without difficulty  ?Other:   ? ?Medical Decision Making  ?Medically screening exam initiated at 10:31 PM.  Appropriate orders placed.  Shade Flood was informed that the remainder of the evaluation will be completed by another provider, this initial triage assessment does not replace that evaluation, and the importance of remaining in the ED until their evaluation is complete. ? ?Patient is medically screened will need further work-up. ?  ?Marcello Fennel, PA-C ?05/22/21 0202 ? ?

## 2021-05-22 ENCOUNTER — Ambulatory Visit (INDEPENDENT_AMBULATORY_CARE_PROVIDER_SITE_OTHER): Payer: Medicare Other | Admitting: Orthopedic Surgery

## 2021-05-22 ENCOUNTER — Encounter: Payer: Self-pay | Admitting: Orthopedic Surgery

## 2021-05-22 ENCOUNTER — Ambulatory Visit (INDEPENDENT_AMBULATORY_CARE_PROVIDER_SITE_OTHER): Payer: Medicare Other

## 2021-05-22 DIAGNOSIS — S62235D Other nondisplaced fracture of base of first metacarpal bone, left hand, subsequent encounter for fracture with routine healing: Secondary | ICD-10-CM

## 2021-05-22 NOTE — Patient Instructions (Signed)
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
## 2021-05-22 NOTE — ED Provider Notes (Signed)
?[facility] ?Provider Note ? ? ?CSN: [id number] ?Arrival date & time: 05/21/21  2213 ? ?  ? ?History ? ?Chief Complaint  ?Patient presents with  ? Tingling  ? ? ?Joseph Pruitt is a 56 y.o. male. ? ?HPI ? ?Patient without significant medical history presents with complaints of left shoulder numbness as well as right heel pain.  Patient states that patient left shoulder numbness gone for about a month's time, states it is intermittent, will last about 30 seconds and then come back about half hour later, states its a ring around from his neck to the top of his shoulder, does not rate down to his hand, there is no recent trauma to the area, states he has full range of motion 5 5 strength in his left upper extremity, he states he is not taking anything for pain at this time, he does note that he has had neck surgery in the past and has plates in it.  Patient states that he is also having right heel pain this is also gone for about a years time, pain is mainly on the Achilles tendon, is worse with movement improved with rest, states he has had this in the past where he had had surgery on it.  There is been no recent trauma to the area. ? ? ? ?Home Medications ?Prior to Admission medications   ?Medication Sig Start Date End Date Taking? Authorizing Provider  ?cyclobenzaprine (FLEXERIL) 10 MG tablet Take 1 tablet (10 mg total) by mouth 2 (two) times daily as needed for muscle spasms. 05/21/21  Yes Marcello Fennel, PA-C  ?predniSONE (DELTASONE) 10 MG tablet Take 3 tablets (30 mg total) by mouth daily for 5 days. 05/21/21 05/26/21 Yes Marcello Fennel, PA-C  ?aspirin 81 MG EC tablet Take 1 tablet by mouth daily. 06/10/16   [provider]  ?fluticasone (FLONASE) 50 MCG/ACT nasal spray Place 1 spray into both nostrils daily. 10/05/14 05/01/21  [provider]  ?gabapentin (NEURONTIN) 300 MG capsule Take 300 mg by mouth 2 (two) times daily. 02/12/21   [provider]  ?hydrochlorothiazide (HYDRODIURIL) 25 MG tablet Take 25 mg by mouth daily.    [provider]  ?ibuprofen (ADVIL) 800 MG tablet Take 1 tablet (800 mg total) by mouth every 8 (eight) hours as needed for moderate pain. 09/10/19   Milton Ferguson, MD  ?naproxen (NAPROSYN) 375 MG tablet Take 1 tablet (375 mg total) by mouth 2 (two) times daily. 07/11/20   Horton, Alvin Critchley, DO  ?oxyCODONE-acetaminophen (PERCOCET/ROXICET) 5-325 MG tablet Take 1 tablet by mouth every 6 (six) hours as needed for moderate pain or severe pain.  11/22/14   [provider]  ?Triamcinolone Acetonide ([employer] NASAL ALLERGY SPRAY NA) Place into the nose.    [provider]  ?   ? ?Allergies    ?Patient has no known allergies.   ? ?Review of Systems   ?Review of Systems  ?Constitutional:  Negative for chills and fever.  ?Respiratory:  Negative for shortness of breath.   ?Cardiovascular:  Negative for chest pain.  ?Gastrointestinal:  Negative for abdominal pain.  ?Musculoskeletal:   ?     Left shoulder tingling as well as right heel pain.  ?Neurological:  Negative for headaches.  ? ?Physical Exam ?Updated Vital Signs ?BP 122/84 (BP Location: Right Arm)   Pulse 81   Temp 98.2 ?F (36.8 ?C) (Oral)   Resp 18   Ht '6\' 1"'$  (1.854 m)  Wt 118.4 kg   SpO2 96%   BMI 34.43 kg/m?  ?Physical Exam ?Vitals and nursing note reviewed.  ?Constitutional:   ?   General: He is not in acute distress. ?   Appearance: He is not ill-appearing.  ?HENT:  ?   Head: Normocephalic and atraumatic.  ?   Nose: No congestion.  ?Eyes:  ?   Conjunctiva/sclera: Conjunctivae normal.  ?Cardiovascular:  ?   Rate and Rhythm: Normal rate and regular rhythm.  ?   Pulses: Normal pulses.  ?   Heart sounds: No murmur heard. ?  No friction rub. No gallop.  ?Pulmonary:  ?   Effort: No respiratory distress.  ?   Breath sounds: No wheezing, rhonchi or rales.  ?Musculoskeletal:  ?   Comments: Spine was palpated was nontender to palpation no step-off forms noted, no  overlying skin changes.  Patient was noted to have a cast on his left arm from the elbow down to his wrist, but he is able to move all fingers bilaterally, patient has good cap refills on all fingers of the left hand, he is able to bend at the elbow and shoulder without difficulty. ? ?Lower extremities were visualized no overlying skin changes no edema or erythema present, patient had tenderness at the insertion site of the Achilles tendon, no defects present, negative Thompson sign, no palpable cords.  Neurovascularly intact bilaterally.  ?Skin: ?   General: Skin is warm and dry.  ?Neurological:  ?   Mental Status: He is alert.  ?   Comments: Cranial nerves II through XII grossly intact, no difficulty word finding, able follow two-step commands, no unilateral weakness present, gait fully intact  ?Psychiatric:     ?   Mood and Affect: Mood normal.  ? ? ?ED Results / Procedures / Treatments   ?Labs ?(all labs ordered are listed, but only abnormal results are displayed) ?Labs Reviewed - No data to display ? ?EKG ?None ? ?Radiology ?No results found. ? ?Procedures ?Procedures  ? ? ?Medications Ordered in ED ?Medications - No data to display ? ?ED Course/ Medical Decision Making/ A&P ?  ?                        ?Medical Decision Making ?Risk ?Prescription drug management. ? ? ?This patient presents to the ED for concern of presents with left shoulder paresthesias and right heel pain, this involves an extensive number of treatment options, and is a complaint that carries with it a high risk of complications and morbidity.  The differential diagnosis includes compartment syndrome, Achilles tendon rupture, fracture, dislocation ? ? ? ?Additional history obtained: ? ?Additional history obtained from N/A ?External records from outside source obtained and reviewed including N/A ? ? ?Co morbidities that complicate the patient evaluation ? ?N/A ? ?Social Determinants of Health: ? ?N/A ? ? ? ?Lab Tests: ? ?I Ordered, and  personally interpreted labs.  The pertinent results include:  N/A ? ? ?Imaging Studies ordered: ? ?I ordered imaging studies including N/A  ?I independently visualized and interpreted imaging which showed N/A ?I agree with the radiologist interpretation ? ? ?Cardiac Monitoring: ? ?The patient was maintained on a cardiac monitor.  I personally viewed and interpreted the cardiac monitored which showed an underlying rhythm of: N/A ? ? ?Medicines ordered and prescription drug management: ? ?I ordered medication including N/A ?I have reviewed the patients home medicines and have made adjustments as needed ? ? ? ?Test Considered: ? ?  Will defer imaging to the neck shoulder as well as right heel there is no traumatic injury associated with this pain, he is at increased risk for pathological fractures, very low suspicion for fracture or dislocation at this time. ? ? ? ?Rule out ?I have low suspicion for spinal cord abnormality or spinal fracture spine was palpated is nontender to palpation no step-off or is noted, he is full range of motion 5 5 strength the upper and lower extremities neurovascularly intact.  I have low suspicion for CVA or intracranial head bleed there no focal deficits, patient is not on anticoag's.  I have low suspicion for Achilles tendon rupture as he has negative Thompson sign, calf is nontender to palpation no deformity present within the calf itself.  I have low suspicion for DVT as no unilateral leg swelling no palpable cords calf nontender. ? ? ? ?Dispostion and problem list ? ?After consideration of the diagnostic results and the patients response to treatment, I feel that the patent would benefit from discharge. ? ?Left shoulder paresthesias-unclear etiology but I suspect likely radiating pain from the cervical spine from previous injury, will provide him with muscle laxer as well as steroids have him follow-up with neurosurgery for further evaluation. ?Right heel pain-likely Achilles tendinitis,  will recommend NSAIDs, follow-up with orthopedics for further evaluation. ? ? ? ? ? ? ? ? ? ? ? ?Final Clinical Impression(s) / ED Diagnoses ?Final diagnoses:  ?Acute pain of left shoulder  ?Pain of right heel  ? ? ?Rx

## 2021-06-05 ENCOUNTER — Encounter: Payer: Self-pay | Admitting: Orthopedic Surgery

## 2021-06-05 ENCOUNTER — Encounter: Payer: Medicare Other | Admitting: Orthopedic Surgery

## 2021-06-18 ENCOUNTER — Ambulatory Visit (INDEPENDENT_AMBULATORY_CARE_PROVIDER_SITE_OTHER): Payer: Medicare Other | Admitting: Orthopaedic Surgery

## 2021-06-18 ENCOUNTER — Encounter: Payer: Self-pay | Admitting: Orthopaedic Surgery

## 2021-06-18 ENCOUNTER — Ambulatory Visit: Payer: Medicare Other

## 2021-06-18 VITALS — BP 149/95 | HR 65 | Ht 73.0 in | Wt 261.0 lb

## 2021-06-18 DIAGNOSIS — S62235D Other nondisplaced fracture of base of first metacarpal bone, left hand, subsequent encounter for fracture with routine healing: Secondary | ICD-10-CM

## 2021-06-18 NOTE — Progress Notes (Signed)
I am glad to get the cast off. ? ?He has fracture of the base of the first metacarpal on the left.  He has been in a thumb spica cast.  He has no new trauma.  He missed his last appointment with Dr. Amedeo Kinsman. ? ?Cast was removed.  X-rays were done of the left thumb, reported separately.  NV intact.  He has no skin problems form the cast. ? ?BP (!) 149/95   Pulse 65   Ht '6\' 1"'$  (1.854 m)   Wt 261 lb (118.4 kg)   BMI 34.43 kg/m?  ? ?Encounter Diagnosis  ?Name Primary?  ? Closed nondisplaced fracture of base of first metacarpal bone of left hand with routine healing, unspecified fracture morphology, subsequent encounter Yes  ? ?I have shown him his X-rays today. ? ?A velcro brace was given with instructions for use. ? ?Return in two weeks.  X-rays then. To see Dr. Amedeo Kinsman. ? ?Call if any problem. ? ?Precautions discussed. ? ?Electronically Signed ?Sanjuana Kava, MD ?4/11/20238:34 AM ? ?

## 2021-06-28 ENCOUNTER — Ambulatory Visit: Payer: Medicare Other

## 2021-06-28 ENCOUNTER — Encounter: Payer: Self-pay | Admitting: Orthopedic Surgery

## 2021-06-28 ENCOUNTER — Other Ambulatory Visit: Payer: Self-pay | Admitting: Orthopedic Surgery

## 2021-06-28 ENCOUNTER — Ambulatory Visit (INDEPENDENT_AMBULATORY_CARE_PROVIDER_SITE_OTHER): Payer: Medicare Other | Admitting: Orthopedic Surgery

## 2021-06-28 VITALS — Ht 73.0 in | Wt 263.0 lb

## 2021-06-28 DIAGNOSIS — S62235D Other nondisplaced fracture of base of first metacarpal bone, left hand, subsequent encounter for fracture with routine healing: Secondary | ICD-10-CM | POA: Diagnosis not present

## 2021-06-28 NOTE — Progress Notes (Signed)
Return Patient Visit ? ?Assessment: ?ALFIO LOESCHER is a 56 y.o. male with the following: ?1. Closed nondisplaced fracture of base of first metacarpal bone of left hand ? ? ?Plan: ?Radiographs remained stable.  The swelling he experienced was likely related to the injury.  This will gradually subside, but he can note some swelling around the hand for several months after surgery.  This was discussed with the patient, and all questions were answered.  He has developed some stiffness at the IP joint, and some exercises were reviewed in clinic today.  He should continue to work on his range of motion.  He should also start to wean out of his splint.  Follow-up in 1 month. ? ? ? ?Follow-up: ?Return in about 4 weeks (around 07/26/2021). ? ?Subjective: ? ?Chief Complaint  ?Patient presents with  ? Fracture  ?  Lt hand DOI 05/01/21  ? ? ?History of Present Illness: ?MAXAMILLIAN TIENDA is a 56 y.o. male who presents for evaluation of a left hand injury.  He sustained an injury to his left hand approximately 2 months ago.  He missed his last appointment with me, but was subsequently seen by my partner last week.  The cast was removed, and he was placed in a brace.  Over the past few days, he has noticed swelling to the hand, and was concerned.  He comes today to discuss this.  He states that the swelling has improved since he made the appointment.  He notes some stiffness hand, but feels well otherwise. ? ?Review of Systems: ?No fevers or chills  ?No numbness or tingling ?No chest pain ?No shortness of breath ?No bowel or bladder dysfunction ?No GI distress ?No headaches ? ? ? ?Objective: ?Ht '6\' 1"'$  (1.854 m)   Wt 263 lb (119.3 kg)   BMI 34.70 kg/m?  ? ?Physical Exam: ? ?General: Alert and oriented. and No acute distress. ?Gait: Normal gait. ? ?Left hand without deformity.  Mild swelling is appreciated throughout the left hand.  He has good grip strength.  He has limited range of motion of the IP joint of the left thumb.  Fingers  warm and well-perfused. ? ?IMAGING: ?I personally reviewed images previously obtained from the ED ? ? ?X-ray of the left hand was obtained in clinic today.  There has been no interval displacement of the fracture site.  No acute injuries are noted.  No dislocation. ? ?Impression: Stable left first metacarpal base fracture ? ?New Medications:  ?No orders of the defined types were placed in this encounter. ? ? ? ? ?Mordecai Rasmussen, MD ? ?06/28/2021 ?10:40 PM ? ? ?

## 2021-06-29 ENCOUNTER — Emergency Department (HOSPITAL_COMMUNITY)
Admission: EM | Admit: 2021-06-29 | Discharge: 2021-06-29 | Disposition: A | Discharge status: Home / Self Care | Payer: Medicare Other | Attending: Emergency Medicine | Admitting: Emergency Medicine

## 2021-06-29 ENCOUNTER — Other Ambulatory Visit: Payer: Self-pay

## 2021-06-29 ENCOUNTER — Encounter (HOSPITAL_COMMUNITY): Payer: Self-pay

## 2021-06-29 DIAGNOSIS — Z85038 Personal history of other malignant neoplasm of large intestine: Secondary | ICD-10-CM | POA: Insufficient documentation

## 2021-06-29 DIAGNOSIS — R0981 Nasal congestion: Secondary | ICD-10-CM

## 2021-06-29 DIAGNOSIS — I10 Essential (primary) hypertension: Secondary | ICD-10-CM | POA: Diagnosis not present

## 2021-06-29 DIAGNOSIS — J301 Allergic rhinitis due to pollen: Secondary | ICD-10-CM | POA: Insufficient documentation

## 2021-06-29 DIAGNOSIS — Z7982 Long term (current) use of aspirin: Secondary | ICD-10-CM | POA: Diagnosis not present

## 2021-06-29 DIAGNOSIS — J302 Other seasonal allergic rhinitis: Secondary | ICD-10-CM

## 2021-06-29 MED ORDER — LORATADINE 10 MG PO TABS: 10.0000 mg | ORAL_TABLET | Freq: Every day | ORAL | 0 refills | Status: DC

## 2021-06-29 MED ORDER — FLUTICASONE PROPIONATE 50 MCG/ACT NA SUSP: 1.0000 | Freq: Every day | NASAL | 2 refills | Status: DC

## 2021-06-29 NOTE — Discharge Instructions (Addendum)
You are seen today for congestion and sinus pressure.  This is likely related at least in part to seasonal allergies.  Start Claritin daily.  Add Flonase to your regimen. ?

## 2021-06-29 NOTE — ED Provider Notes (Signed)
?[facility] ?Provider Note ? ? ?CSN: [id number] ?Arrival date & time: 06/29/21  2151 ? ?  ? ?History ? ?No chief complaint on file. ? ? ?Joseph Pruitt is a 56 y.o. male. ? ?HPI ? ?  ? ?This is a 56 year old male with history of high blood pressure high cholesterol, colon cancer who presents with nasal congestion.  Patient reports 1 to 2-week history of worsening nasal congestion.  He feels like congestion is worse at night.  He does use nasal saline occasionally which seems to help some.  He reports itchy watery eyes and worsening of his symptoms "after cutting the grass."  He did take 1 Claritin today; however, woke up this evening and "I could not breathe through my nose."  No fevers.  No sore throat.  No upper respiratory symptoms otherwise. ? ?Home Medications ?Prior to Admission medications   ?Medication Sig Start Date End Date Taking? Authorizing Provider  ?fluticasone (FLONASE) 50 MCG/ACT nasal spray Place 1 spray into both nostrils daily. 06/29/21  Yes Krystin Keeven, Barbette Hair, MD  ?loratadine (CLARITIN) 10 MG tablet Take 1 tablet (10 mg total) by mouth daily. 06/29/21  Yes Avan Gullett, Barbette Hair, MD  ?aspirin 81 MG EC tablet Take 1 tablet by mouth daily. 06/10/16   [provider]  ?cyclobenzaprine (FLEXERIL) 10 MG tablet Take 1 tablet (10 mg total) by mouth 2 (two) times daily as needed for muscle spasms. 05/21/21   Marcello Fennel, PA-C  ?fluticasone (FLONASE) 50 MCG/ACT nasal spray Place 1 spray into both nostrils daily. 10/05/14 05/01/21  [provider]  ?gabapentin (NEURONTIN) 300 MG capsule Take 300 mg by mouth 2 (two) times daily. 02/12/21   [provider]  ?hydrochlorothiazide (HYDRODIURIL) 25 MG tablet Take 25 mg by mouth daily.    [provider]  ?ibuprofen (ADVIL) 800 MG tablet Take 1 tablet (800 mg total) by mouth every 8 (eight) hours as needed for moderate pain. 09/10/19   Milton Ferguson, MD  ?naproxen (NAPROSYN) 375 MG tablet Take 1 tablet (375 mg  total) by mouth 2 (two) times daily. 07/11/20   Itzamara Casas, Alvin Critchley, DO  ?oxyCODONE-acetaminophen (PERCOCET/ROXICET) 5-325 MG tablet Take 1 tablet by mouth every 6 (six) hours as needed for moderate pain or severe pain.  11/22/14   [provider]  ?Triamcinolone Acetonide ([employer] NASAL ALLERGY SPRAY NA) Place into the nose.    [provider]  ?   ? ?Allergies    ?Patient has no known allergies.   ? ?Review of Systems   ?Review of Systems  ?Constitutional:  Negative for fever.  ?HENT:  Positive for congestion. Negative for postnasal drip.   ?Eyes:  Positive for itching. Negative for discharge.  ?All other systems reviewed and are negative. ? ?Physical Exam ?Updated Vital Signs ?BP 137/89   Pulse 82   Temp 98.2 ?F (36.8 ?C) (Oral)   Resp 18   Ht 1.854 m ('6\' 1"'$ )   Wt 117.9 kg   SpO2 97%   BMI 34.30 kg/m?  ?Physical Exam ?Vitals and nursing note reviewed.  ?Constitutional:   ?   Appearance: He is well-developed. He is not ill-appearing.  ?HENT:  ?   Head: Normocephalic and atraumatic.  ?   Nose:  ?   Comments: Swollen nasal turbinates bilaterally, no drainage noted ?   Mouth/Throat:  ?   Mouth: Mucous membranes are moist.  ?   Pharynx: No oropharyngeal exudate or posterior oropharyngeal erythema.  ?   Comments: 2+  bilateral tonsillar enlargement, uvula midline ?Eyes:  ?   Pupils: Pupils are equal, round, and reactive to light.  ?Cardiovascular:  ?   Rate and Rhythm: Normal rate and regular rhythm.  ?   Heart sounds: Normal heart sounds. No murmur heard. ?Pulmonary:  ?   Effort: Pulmonary effort is normal. No respiratory distress.  ?   Breath sounds: Normal breath sounds. No wheezing.  ?Abdominal:  ?   Palpations: Abdomen is soft.  ?   Tenderness: There is no abdominal tenderness.  ?Musculoskeletal:  ?   Cervical back: Neck supple.  ?Lymphadenopathy:  ?   Cervical: No cervical adenopathy.  ?Skin: ?   General: Skin is warm and dry.  ?Neurological:  ?   Mental Status: He is alert and oriented to person,  place, and time.  ?Psychiatric:     ?   Mood and Affect: Mood normal.  ? ? ?ED Results / Procedures / Treatments   ?Labs ?(all labs ordered are listed, but only abnormal results are displayed) ?Labs Reviewed - No data to display ? ?EKG ?None ? ?Radiology ?No results found. ? ?Procedures ?Procedures  ? ? ?Medications Ordered in ED ?Medications - No data to display ? ?ED Course/ Medical Decision Making/ A&P ?  ?                        ?Medical Decision Making ?Risk ?OTC drugs. ? ? ?This patient presents to the ED for concern of nasal congestion, this involves an extensive number of treatment options, and is a complaint that carries with it a high risk of complications and morbidity.  The differential diagnosis includes seasonal allergies, allergic rhinitis, less likely sinus infection, viral etiology ? ?MDM:   ? ?Patient presents with congestion.  Fairly isolated.  He is nontoxic and vital signs are reassuring.  He is in no acute distress.  Given history, highly suspect seasonal allergies as the culprit.  He has swollen nasal turbinates.  He has been afebrile.  Lower suspicion for viral or bacterial etiology.  Recommend starting Claritin daily and adding Flonase.  No indication for antibiotics at this time ?(Labs, imaging) ? ?Labs: ?I Ordered, and personally interpreted labs.  The pertinent results include: None ? ?Imaging Studies ordered: ?I ordered imaging studies including none ?I independently visualized and interpreted imaging. ?I agree with the radiologist interpretation ? ?Additional history obtained from wife at bedside.  External records from outside source obtained and reviewed including prior evaluation ? ?Critical Interventions: ?N/A ? ?Consultations: ?I requested consultation with the NA,  and discussed lab and imaging findings as well as pertinent plan - they recommend: N/A ? ?Cardiac Monitoring: ?The patient was maintained on a cardiac monitor.  I personally viewed and interpreted the cardiac monitored  which showed an underlying rhythm of: Normal sinus rhythm ? ?Reevaluation: ?After the interventions noted above, I reevaluated the patient and found that they have :stayed the same ? ? ?Considered admission for: N/A ? ?Social Determinants of Health: ?Lives independently ? ?Disposition: Discharge ? ?Co morbidities that complicate the patient evaluation ? ?Past Medical History:  ?Diagnosis Date  ? [facility])   ? colon cancer  ? Environmental allergies   ? High cholesterol   ? Hypertension   ? Stomach ulcer   ?  ? ?Medicines ?Meds ordered this encounter  ?Medications  ? loratadine (CLARITIN) 10 MG tablet  ?  Sig: Take 1 tablet (10 mg total) by mouth daily.  ?  Dispense:  30 tablet  ?  Refill:  0  ? fluticasone (FLONASE) 50 MCG/ACT nasal spray  ?  Sig: Place 1 spray into both nostrils daily.  ?  Dispense:  16 g  ?  Refill:  2  ?  ?I have reviewed the patients home medicines and have made adjustments as needed ? ?Problem List / ED Course: ?Problem List Items Addressed This Visit   ?None ?Visit Diagnoses   ? ? Seasonal allergies    -  Primary  ? Nasal congestion      ? ?  ?  ? ? ? ? ? ? ? ? ? ? ? ? ?Final Clinical Impression(s) / ED Diagnoses ?Final diagnoses:  ?Seasonal allergies  ?Nasal congestion  ? ? ?Rx / DC Orders ?ED Discharge Orders   ? ?      Ordered  ?  loratadine (CLARITIN) 10 MG tablet  Daily       ? 06/29/21 2341  ?  fluticasone (FLONASE) 50 MCG/ACT nasal spray  Daily       ? 06/29/21 2341  ? ?  ?  ? ?  ? ? ?  ?Merryl Hacker, MD ?06/29/21 2345 ? ?

## 2021-06-29 NOTE — ED Triage Notes (Signed)
Pt presents with sinus pressure, runny nose, burning eyes after mowing his grass two days ago. Pt taking Claritin with no relief.   ?

## 2021-07-02 ENCOUNTER — Encounter: Payer: Medicare Other | Admitting: Orthopedic Surgery

## 2021-07-24 ENCOUNTER — Emergency Department (HOSPITAL_COMMUNITY)
Admission: EM | Admit: 2021-07-24 | Discharge: 2021-07-24 | Disposition: A | Discharge status: Home / Self Care | Payer: Medicare Other | Attending: Emergency Medicine | Admitting: Emergency Medicine

## 2021-07-24 ENCOUNTER — Emergency Department (HOSPITAL_COMMUNITY): Payer: Medicare Other

## 2021-07-24 ENCOUNTER — Encounter (HOSPITAL_COMMUNITY): Payer: Self-pay | Admitting: Emergency Medicine

## 2021-07-24 ENCOUNTER — Other Ambulatory Visit: Payer: Self-pay

## 2021-07-24 DIAGNOSIS — M25562 Pain in left knee: Secondary | ICD-10-CM | POA: Insufficient documentation

## 2021-07-24 DIAGNOSIS — Z7982 Long term (current) use of aspirin: Secondary | ICD-10-CM | POA: Insufficient documentation

## 2021-07-24 DIAGNOSIS — M7042 Prepatellar bursitis, left knee: Secondary | ICD-10-CM

## 2021-07-24 MED ORDER — PREDNISONE 20 MG PO TABS
40.0000 mg | ORAL_TABLET | Freq: Once | ORAL | Status: AC
  Administered 2021-07-24: 40 mg via ORAL
  Filled 2021-07-24: qty 2

## 2021-07-24 MED ORDER — PREDNISONE 10 MG PO TABS: 20.0000 mg | ORAL_TABLET | Freq: Two times a day (BID) | ORAL | 0 refills | Status: DC
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
## 2021-07-24 NOTE — Discharge Instructions (Signed)
Begin taking prednisone as prescribed. ? ?Wear Ace bandage for compression and support. ? ?Follow-up with orthopedics if your symptoms are not improving in the next 1 to 2 weeks.  The contact information for Dr. Aline Brochure has been provided in this discharge summary for you to call and make these arrangements. ?

## 2021-07-26 ENCOUNTER — Encounter: Payer: Medicare Other | Admitting: Orthopedic Surgery

## 2021-07-31 ENCOUNTER — Encounter: Payer: Medicare Other | Admitting: Orthopedic Surgery

## 2021-08-01 DIAGNOSIS — M7750 Other enthesopathy of unspecified foot: Secondary | ICD-10-CM | POA: Insufficient documentation

## 2021-09-06 ENCOUNTER — Other Ambulatory Visit: Payer: Self-pay

## 2021-09-06 ENCOUNTER — Encounter (HOSPITAL_COMMUNITY): Payer: Self-pay | Admitting: Emergency Medicine

## 2021-09-06 ENCOUNTER — Emergency Department (HOSPITAL_COMMUNITY)
Admission: EM | Admit: 2021-09-06 | Discharge: 2021-09-06 | Disposition: A | Discharge status: Home / Self Care | Payer: Medicare Other | Attending: Emergency Medicine | Admitting: Emergency Medicine

## 2021-09-06 DIAGNOSIS — I10 Essential (primary) hypertension: Secondary | ICD-10-CM | POA: Diagnosis not present

## 2021-09-06 DIAGNOSIS — Z79899 Other long term (current) drug therapy: Secondary | ICD-10-CM | POA: Diagnosis not present

## 2021-09-06 DIAGNOSIS — R21 Rash and other nonspecific skin eruption: Secondary | ICD-10-CM

## 2021-09-06 DIAGNOSIS — Z7982 Long term (current) use of aspirin: Secondary | ICD-10-CM | POA: Diagnosis not present

## 2021-09-06 MED ORDER — HYDROXYZINE HCL 25 MG PO TABS: 25.0000 mg | ORAL_TABLET | Freq: Four times a day (QID) | ORAL | 0 refills | Status: AC | PRN

## 2021-09-06 MED ORDER — METHYLPREDNISOLONE SODIUM SUCC 125 MG IJ SOLR
125.0000 mg | Freq: Once | INTRAMUSCULAR | Status: AC
  Administered 2021-09-06: 125 mg via INTRAMUSCULAR
  Filled 2021-09-06: qty 2

## 2021-09-06 NOTE — Discharge Instructions (Signed)
Follow up if not improving

## 2021-09-06 NOTE — ED Triage Notes (Signed)
Pt to ER with c/o "poison oak".  Pt states cut down a tree yesterday and now has itching and rash to back of neck and bilateral forearms.

## 2021-09-06 NOTE — ED Provider Notes (Signed)
[facility] EMERGENCY DEPARTMENT Provider Note   CSN: [id number] Arrival date & time: 09/06/21  3810     History  Chief Complaint  Patient presents with   Rash    Joseph Pruitt is a 56 y.o. male.  Patient has a history of hypertension.  Patient complains of a rash to his arms and neck.  He is allergic to poison ivy and poison oak and he has been taking trees down  The history is provided by the patient and medical records. No language interpreter was used.  Rash Location: Chest and arms. Quality: not blistering   Severity:  Moderate Onset quality:  Sudden Timing:  Constant Progression:  Worsening Chronicity:  New Context: not animal contact   Relieved by:  Nothing Worsened by:  Nothing Associated symptoms: no abdominal pain, no diarrhea, no fatigue and no headaches        Home Medications Prior to Admission medications   Medication Sig Start Date End Date Taking? Authorizing Provider  hydrOXYzine (ATARAX) 25 MG tablet Take 1 tablet (25 mg total) by mouth every 6 (six) hours as needed for itching. 09/06/21  Yes Milton Ferguson, MD  aspirin 81 MG EC tablet Take 1 tablet by mouth daily. 06/10/16   [provider]  cyclobenzaprine (FLEXERIL) 10 MG tablet Take 1 tablet (10 mg total) by mouth 2 (two) times daily as needed for muscle spasms. 05/21/21   Marcello Fennel, PA-C  fluticasone (FLONASE) 50 MCG/ACT nasal spray Place 1 spray into both nostrils daily. 10/05/14 05/01/21  [provider]  fluticasone (FLONASE) 50 MCG/ACT nasal spray Place 1 spray into both nostrils daily. 06/29/21   Horton, Barbette Hair, MD  gabapentin (NEURONTIN) 300 MG capsule Take 300 mg by mouth 2 (two) times daily. 02/12/21   [provider]  hydrochlorothiazide (HYDRODIURIL) 25 MG tablet Take 25 mg by mouth daily.    [provider]  ibuprofen (ADVIL) 800 MG tablet Take 1 tablet (800 mg total) by mouth every 8 (eight) hours as needed for moderate pain. 09/10/19   Milton Ferguson, MD  loratadine (CLARITIN) 10 MG tablet Take 1 tablet (10 mg total) by mouth daily. 06/29/21   Horton, Barbette Hair, MD  naproxen (NAPROSYN) 375 MG tablet Take 1 tablet (375 mg total) by mouth 2 (two) times daily. 07/11/20   Horton, Alvin Critchley, DO  oxyCODONE-acetaminophen (PERCOCET/ROXICET) 5-325 MG tablet Take 1 tablet by mouth every 6 (six) hours as needed for moderate pain or severe pain.  11/22/14   [provider]  predniSONE (DELTASONE) 10 MG tablet Take 2 tablets (20 mg total) by mouth 2 (two) times daily. 07/24/21   Veryl Speak, MD  Triamcinolone Acetonide ([employer] NASAL ALLERGY SPRAY NA) Place into the nose.    [provider]      Allergies    Patient has no known allergies.    Review of Systems   Review of Systems  Constitutional:  Negative for appetite change and fatigue.  HENT:  Negative for congestion, ear discharge and sinus pressure.   Eyes:  Negative for discharge.  Respiratory:  Negative for cough.   Cardiovascular:  Negative for chest pain.  Gastrointestinal:  Negative for abdominal pain and diarrhea.  Genitourinary:  Negative for frequency and hematuria.  Musculoskeletal:  Negative for back pain.  Skin:  Positive for rash.  Neurological:  Negative for seizures and headaches.  Psychiatric/Behavioral:  Negative for hallucinations.     Physical Exam Updated Vital Signs BP 131/89   Pulse  81   Temp 98.2 F (36.8 C) (Oral)   Resp 18   Ht '6\' 1"'$  (1.854 m)   Wt 118.4 kg   SpO2 97%   BMI 34.43 kg/m  Physical Exam Vitals and nursing note reviewed.  Constitutional:      Appearance: He is well-developed.  HENT:     Head: Normocephalic.     Nose: Nose normal.  Eyes:     General: No scleral icterus.    Conjunctiva/sclera: Conjunctivae normal.  Neck:     Thyroid: No thyromegaly.  Cardiovascular:     Rate and Rhythm: Normal rate and regular rhythm.     Heart sounds: No murmur heard.    No friction rub. No gallop.  Pulmonary:     Breath sounds:  No stridor. No wheezing or rales.  Chest:     Chest wall: No tenderness.  Abdominal:     General: There is no distension.     Tenderness: There is no abdominal tenderness. There is no rebound.  Musculoskeletal:     Cervical back: Neck supple.     Comments: Rash to arms and chest  Lymphadenopathy:     Cervical: No cervical adenopathy.  Skin:    Findings: No erythema or rash.  Neurological:     Mental Status: He is oriented to person, place, and time.     Motor: No abnormal muscle tone.     Coordination: Coordination normal.  Psychiatric:        Behavior: Behavior normal.     ED Results / Procedures / Treatments   Labs (all labs ordered are listed, but only abnormal results are displayed) Labs Reviewed - No data to display  EKG None  Radiology No results found.  Procedures Procedures    Medications Ordered in ED Medications  methylPREDNISolone sodium succinate (SOLU-MEDROL) 125 mg/2 mL injection 125 mg (has no administration in time range)    ED Course/ Medical Decision Making/ A&P                           Medical Decision Making Risk Prescription drug management.  This patient presents to the ED for concern of rash, this involves an extensive number of treatment options, and is a complaint that carries with it a high risk of complications and morbidity.  The differential diagnosis includes allergic dermatitis   Co morbidities that complicate the patient evaluation  None   Additional history obtained:  Additional history obtained from patient External records from outside source obtained and reviewed including hospital records   Lab Tests:  No lab  Imaging Studies ordered:  No x-rays Cardiac Monitoring: / EKG:  The patient was maintained on a cardiac monitor.  I personally viewed and interpreted the cardiac monitored which showed an underlying rhythm of: Normal sinus rhythm   Consultations Obtained:  No consult  Problem List / ED Course /  Critical interventions / Medication management    Rash I ordered medication including Solu-Medrol injection Reevaluation of the patient after these medicines showed that the patient stayed the same I have reviewed the patients home medicines and have made adjustments as needed   Social Determinants of Health:  None   Test / Admission - Considered:  None  Patient with allergic dermatitis.  He will be treated with steroids and Vistaril and follow-up as needed        Final Clinical Impression(s) / ED Diagnoses Final diagnoses:  Rash    Rx /  DC Orders ED Discharge Orders          Ordered    hydrOXYzine (ATARAX) 25 MG tablet  Every 6 hours PRN        09/06/21 8546              Milton Ferguson, MD 09/06/21 1754

## 2021-09-10 ENCOUNTER — Encounter (HOSPITAL_COMMUNITY): Payer: Self-pay | Admitting: Emergency Medicine

## 2021-09-10 ENCOUNTER — Emergency Department (HOSPITAL_COMMUNITY)
Admission: EM | Admit: 2021-09-10 | Discharge: 2021-09-10 | Disposition: A | Discharge status: Home / Self Care | Payer: Medicare Other | Attending: Emergency Medicine | Admitting: Emergency Medicine

## 2021-09-10 ENCOUNTER — Other Ambulatory Visit: Payer: Self-pay

## 2021-09-10 DIAGNOSIS — R21 Rash and other nonspecific skin eruption: Secondary | ICD-10-CM | POA: Diagnosis present

## 2021-09-10 DIAGNOSIS — Z85038 Personal history of other malignant neoplasm of large intestine: Secondary | ICD-10-CM | POA: Diagnosis not present

## 2021-09-10 DIAGNOSIS — L237 Allergic contact dermatitis due to plants, except food: Secondary | ICD-10-CM | POA: Insufficient documentation

## 2021-09-10 DIAGNOSIS — Z7982 Long term (current) use of aspirin: Secondary | ICD-10-CM | POA: Insufficient documentation

## 2021-09-10 MED ORDER — METHYLPREDNISOLONE SODIUM SUCC 125 MG IJ SOLR
125.0000 mg | Freq: Once | INTRAMUSCULAR | Status: AC
  Administered 2021-09-10: 125 mg via INTRAMUSCULAR
  Filled 2021-09-10: qty 2

## 2021-09-10 MED ORDER — PREDNISONE 10 MG PO TABS: ORAL_TABLET | ORAL | 0 refills | Status: AC

## 2021-09-10 NOTE — Discharge Instructions (Addendum)
Please pick up medication at the store. I have placed you on a long steroid taper outpatient as shorter courses are associated with rebound symptoms. Please schedule a follow up appointment with your PCP.

## 2021-09-10 NOTE — ED Triage Notes (Addendum)
Pt c/o poison oak all over body. Was here 09/06/21 for same. Redness noted. Pt back for same. Nad. PT states he wants a shot.

## 2021-09-10 NOTE — ED Provider Notes (Signed)
[facility] EMERGENCY DEPARTMENT Provider Note   CSN: [id number] Arrival date & time: 09/10/21  2215     History PMH: HLD, HTN, hx of colon cancer Chief Complaint  Patient presents with   Rash    Joseph Pruitt is a 56 y.o. male. Patient presents with 4 days of rash.  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  He developed a itchy rash that is on both of his arms, and torso.  He was seen here 3 days ago for the symptoms.  He was given a steroid shot and discharged on antihistamines.  He denies any fevers, chills, recent antibiotic use, nausea, vomiting, abdominal pain.   Rash      Home Medications Prior to Admission medications   Medication Sig Start Date End Date Taking? Authorizing Provider  predniSONE (DELTASONE) 10 MG tablet Take 6 tablets (60 mg total) by mouth daily for 4 days, THEN 5 tablets (50 mg total) daily for 4 days, THEN 4 tablets (40 mg total) daily for 4 days, THEN 3 tablets (30 mg total) daily for 4 days, THEN 2 tablets (20 mg total) daily for 4 days. 09/10/21 09/30/21 Yes Anand Tejada, Adora Fridge, PA-C  aspirin 81 MG EC tablet Take 1 tablet by mouth daily. 06/10/16   [provider]  cyclobenzaprine (FLEXERIL) 10 MG tablet Take 1 tablet (10 mg total) by mouth 2 (two) times daily as needed for muscle spasms. 05/21/21   Marcello Fennel, PA-C  fluticasone (FLONASE) 50 MCG/ACT nasal spray Place 1 spray into both nostrils daily. 10/05/14 05/01/21  [provider]  fluticasone (FLONASE) 50 MCG/ACT nasal spray Place 1 spray into both nostrils daily. 06/29/21   Horton, Barbette Hair, MD  gabapentin (NEURONTIN) 300 MG capsule Take 300 mg by mouth 2 (two) times daily. 02/12/21   [provider]  hydrochlorothiazide (HYDRODIURIL) 25 MG tablet Take 25 mg by mouth daily.    [provider]  hydrOXYzine (ATARAX) 25 MG tablet Take 1 tablet (25 mg total) by mouth every 6 (six) hours as needed for itching. 09/06/21   Milton Ferguson, MD   ibuprofen (ADVIL) 800 MG tablet Take 1 tablet (800 mg total) by mouth every 8 (eight) hours as needed for moderate pain. 09/10/19   Milton Ferguson, MD  loratadine (CLARITIN) 10 MG tablet Take 1 tablet (10 mg total) by mouth daily. 06/29/21   Horton, Barbette Hair, MD  naproxen (NAPROSYN) 375 MG tablet Take 1 tablet (375 mg total) by mouth 2 (two) times daily. 07/11/20   Horton, Alvin Critchley, DO  oxyCODONE-acetaminophen (PERCOCET/ROXICET) 5-325 MG tablet Take 1 tablet by mouth every 6 (six) hours as needed for moderate pain or severe pain.  11/22/14   [provider]  Triamcinolone Acetonide ([employer] NASAL ALLERGY SPRAY NA) Place into the nose.    [provider]      Allergies    Patient has no known allergies.    Review of Systems   Review of Systems  Skin:  Positive for rash.  All other systems reviewed and are negative.   Physical Exam Updated Vital Signs BP (!) 157/93   Pulse 65   Temp (!) 97.5 F (36.4 C) (Oral)   Resp 17   SpO2 96%  Physical Exam Vitals and nursing note reviewed.  Constitutional:      General: He is not in acute distress.    Appearance: Normal appearance. He is well-developed. He is not ill-appearing, toxic-appearing or diaphoretic.  HENT:     Head: Normocephalic and atraumatic.     Nose: No nasal deformity.     Mouth/Throat:     Lips: Pink. No lesions.  Eyes:     General: Gaze aligned appropriately. No scleral icterus.       Right eye: No discharge.        Left eye: No discharge.     Conjunctiva/sclera: Conjunctivae normal.     Right eye: Right conjunctiva is not injected. No exudate or hemorrhage.    Left eye: Left conjunctiva is not injected. No exudate or hemorrhage. Pulmonary:     Effort: Pulmonary effort is normal. No respiratory distress.  Skin:    General: Skin is warm and dry.     Comments: Rash present on bilateral upper extremities, torso.  Multiple linear dermatitic lesions present. Not blistering.  Neurological:     Mental Status:  He is alert and oriented to person, place, and time.  Psychiatric:        Mood and Affect: Mood normal.        Speech: Speech normal.        Behavior: Behavior normal. Behavior is cooperative.     ED Results / Procedures / Treatments   Labs (all labs ordered are listed, but only abnormal results are displayed) Labs Reviewed - No data to display  EKG None  Radiology No results found.  Procedures Procedures   Medications Ordered in ED Medications  methylPREDNISolone sodium succinate (SOLU-MEDROL) 125 mg/2 mL injection 125 mg (has no administration in time range)    ED Course/ Medical Decision Making/ A&P                           Medical Decision Making Risk Prescription drug management.   Patient presents with a rash to torso and upper extremities. Rash consistent with poison oak rash. Not consistent with SJS or TEN. Recommend prolonged steroid course to prevent rebound. Antihistamines for itching. F/u with PCP.   Final Clinical Impression(s) / ED Diagnoses Final diagnoses:  Allergic contact dermatitis due to plants, except food    Rx / DC Orders ED Discharge Orders          Ordered    predniSONE (DELTASONE) 10 MG tablet  Daily        09/10/21 2247              Adolphus Birchwood, PA-C 09/10/21 2255    Lajean Saver, MD 09/10/21 2307

## 2021-12-25 ENCOUNTER — Emergency Department (HOSPITAL_COMMUNITY): Payer: No Typology Code available for payment source

## 2021-12-25 ENCOUNTER — Emergency Department (HOSPITAL_COMMUNITY)
Admission: EM | Admit: 2021-12-25 | Discharge: 2021-12-25 | Disposition: A | Discharge status: Home / Self Care | Payer: No Typology Code available for payment source | Attending: Emergency Medicine | Admitting: Emergency Medicine

## 2021-12-25 ENCOUNTER — Encounter (HOSPITAL_COMMUNITY): Payer: Self-pay

## 2021-12-25 ENCOUNTER — Encounter (HOSPITAL_COMMUNITY): Payer: Self-pay | Admitting: Emergency Medicine

## 2021-12-25 ENCOUNTER — Other Ambulatory Visit: Payer: Self-pay

## 2021-12-25 DIAGNOSIS — M7918 Myalgia, other site: Secondary | ICD-10-CM

## 2021-12-25 DIAGNOSIS — R42 Dizziness and giddiness: Secondary | ICD-10-CM | POA: Insufficient documentation

## 2021-12-25 DIAGNOSIS — R0789 Other chest pain: Secondary | ICD-10-CM | POA: Insufficient documentation

## 2021-12-25 DIAGNOSIS — M542 Cervicalgia: Secondary | ICD-10-CM | POA: Diagnosis not present

## 2021-12-25 DIAGNOSIS — R110 Nausea: Secondary | ICD-10-CM | POA: Insufficient documentation

## 2021-12-25 DIAGNOSIS — M791 Myalgia, unspecified site: Secondary | ICD-10-CM | POA: Insufficient documentation

## 2021-12-25 DIAGNOSIS — M25562 Pain in left knee: Secondary | ICD-10-CM | POA: Diagnosis not present

## 2021-12-25 DIAGNOSIS — M25561 Pain in right knee: Secondary | ICD-10-CM | POA: Diagnosis not present

## 2021-12-25 DIAGNOSIS — H538 Other visual disturbances: Secondary | ICD-10-CM | POA: Diagnosis not present

## 2021-12-25 MED ORDER — ONDANSETRON 4 MG PO TBDP
4.0000 mg | ORAL_TABLET | Freq: Once | ORAL | Status: AC
  Administered 2021-12-25: 4 mg via ORAL
  Filled 2021-12-25: qty 1

## 2021-12-25 MED ORDER — HYDROCODONE-ACETAMINOPHEN 5-325 MG PO TABS
1.0000 | ORAL_TABLET | Freq: Once | ORAL | Status: AC
  Administered 2021-12-25: 1 via ORAL
  Filled 2021-12-25: qty 1

## 2021-12-25 NOTE — ED Triage Notes (Signed)
Josh PA to charge desk to evaluate patient that arrived in c-collar. Will remain in place and to triage
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
## 2021-12-25 NOTE — ED Notes (Signed)
Told registration to put  [place] as first name
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
## 2021-12-26 ENCOUNTER — Emergency Department (HOSPITAL_COMMUNITY)
Admission: EM | Admit: 2021-12-26 | Discharge: 2021-12-26 | Disposition: A | Discharge status: Home / Self Care | Payer: Medicare Other | Attending: Emergency Medicine | Admitting: Emergency Medicine

## 2021-12-26 ENCOUNTER — Other Ambulatory Visit: Payer: Self-pay

## 2021-12-26 ENCOUNTER — Encounter (HOSPITAL_COMMUNITY): Payer: Self-pay | Admitting: *Deleted

## 2021-12-26 DIAGNOSIS — H538 Other visual disturbances: Secondary | ICD-10-CM | POA: Insufficient documentation

## 2021-12-26 DIAGNOSIS — Z7982 Long term (current) use of aspirin: Secondary | ICD-10-CM | POA: Insufficient documentation

## 2021-12-26 DIAGNOSIS — R519 Headache, unspecified: Secondary | ICD-10-CM | POA: Insufficient documentation

## 2021-12-26 DIAGNOSIS — Z79899 Other long term (current) drug therapy: Secondary | ICD-10-CM | POA: Diagnosis not present

## 2021-12-26 DIAGNOSIS — M542 Cervicalgia: Secondary | ICD-10-CM | POA: Diagnosis not present

## 2021-12-26 DIAGNOSIS — Z85048 Personal history of other malignant neoplasm of rectum, rectosigmoid junction, and anus: Secondary | ICD-10-CM | POA: Diagnosis not present

## 2021-12-26 DIAGNOSIS — I10 Essential (primary) hypertension: Secondary | ICD-10-CM | POA: Diagnosis not present

## 2021-12-26 MED ORDER — HYDROCODONE-ACETAMINOPHEN 5-325 MG PO TABS
1.0000 | ORAL_TABLET | Freq: Once | ORAL | Status: AC
  Administered 2021-12-26: 1 via ORAL
  Filled 2021-12-26: qty 1

## 2021-12-26 NOTE — Discharge Instructions (Addendum)
The work-up today was overall reassuring.  Continue taking medicines as we discussed.  Take Tylenol/ibuprofen as needed for pain.  You take your at home oxycodone as needed for breakthrough pain.  Recommend reevaluation in 2 to 3 days.  Please do not hesitate to return to the emergency department if the worrisome signs and symptoms we discussed become apparent.
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
## 2022-01-08 ENCOUNTER — Encounter (HOSPITAL_COMMUNITY): Payer: Self-pay | Admitting: *Deleted

## 2022-01-08 ENCOUNTER — Other Ambulatory Visit: Payer: Self-pay

## 2022-01-08 ENCOUNTER — Other Ambulatory Visit (HOSPITAL_COMMUNITY): Payer: Medicare Other

## 2022-01-08 ENCOUNTER — Emergency Department (HOSPITAL_COMMUNITY): Payer: Medicare Other

## 2022-01-08 ENCOUNTER — Emergency Department (HOSPITAL_COMMUNITY)
Admission: EM | Admit: 2022-01-08 | Discharge: 2022-01-08 | Disposition: A | Discharge status: Home / Self Care | Payer: Medicare Other | Attending: Emergency Medicine | Admitting: Emergency Medicine

## 2022-01-08 DIAGNOSIS — M542 Cervicalgia: Secondary | ICD-10-CM | POA: Diagnosis not present

## 2022-01-08 DIAGNOSIS — Z7982 Long term (current) use of aspirin: Secondary | ICD-10-CM | POA: Diagnosis not present

## 2022-01-08 DIAGNOSIS — R799 Abnormal finding of blood chemistry, unspecified: Secondary | ICD-10-CM | POA: Diagnosis not present

## 2022-01-08 DIAGNOSIS — M545 Low back pain, unspecified: Secondary | ICD-10-CM | POA: Diagnosis not present

## 2022-01-08 DIAGNOSIS — S0990XA Unspecified injury of head, initial encounter: Secondary | ICD-10-CM | POA: Diagnosis present

## 2022-01-08 DIAGNOSIS — S060X0A Concussion without loss of consciousness, initial encounter: Secondary | ICD-10-CM | POA: Insufficient documentation

## 2022-01-08 DIAGNOSIS — M25561 Pain in right knee: Secondary | ICD-10-CM | POA: Insufficient documentation

## 2022-01-08 LAB — CBC WITH DIFFERENTIAL/PLATELET
Abs Immature Granulocytes: 0.01 10*3/uL (ref 0.00–0.07)
Basophils Absolute: 0 10*3/uL (ref 0.0–0.1)
Basophils Relative: 0 %
Eosinophils Absolute: 0.1 10*3/uL (ref 0.0–0.5)
Eosinophils Relative: 3 %
HCT: 42.4 % (ref 39.0–52.0)
Hemoglobin: 14.6 g/dL (ref 13.0–17.0)
Immature Granulocytes: 0 %
Lymphocytes Relative: 47 %
Lymphs Abs: 1.8 10*3/uL (ref 0.7–4.0)
MCH: 30.9 pg (ref 26.0–34.0)
MCHC: 34.4 g/dL (ref 30.0–36.0)
MCV: 89.8 fL (ref 80.0–100.0)
Monocytes Absolute: 0.5 10*3/uL (ref 0.1–1.0)
Monocytes Relative: 11 %
Neutro Abs: 1.6 10*3/uL — ABNORMAL LOW (ref 1.7–7.7)
Neutrophils Relative %: 39 %
Platelets: 193 10*3/uL (ref 150–400)
RBC: 4.72 MIL/uL (ref 4.22–5.81)
RDW: 12.5 % (ref 11.5–15.5)
WBC: 4 10*3/uL (ref 4.0–10.5)
nRBC: 0 % (ref 0.0–0.2)

## 2022-01-08 LAB — BASIC METABOLIC PANEL
Anion gap: 7 (ref 5–15)
BUN: 17 mg/dL (ref 6–20)
CO2: 27 mmol/L (ref 22–32)
Calcium: 8.8 mg/dL — ABNORMAL LOW (ref 8.9–10.3)
Chloride: 101 mmol/L (ref 98–111)
Creatinine, Ser: 1.02 mg/dL (ref 0.61–1.24)
GFR, Estimated: >60 mL/min (ref >60)
Glucose, Bld: 103 mg/dL — ABNORMAL HIGH (ref 70–99)
Potassium: 3.6 mmol/L (ref 3.5–5.1)
Sodium: 135 mmol/L (ref 135–145)

## 2022-01-08 LAB — CBG MONITORING, ED: Glucose-Capillary: 151 mg/dL — ABNORMAL HIGH (ref 70–99)

## 2022-01-08 MED ORDER — METOCLOPRAMIDE HCL 5 MG/ML IJ SOLN
10.0000 mg | Freq: Once | INTRAMUSCULAR | Status: AC
  Administered 2022-01-08: 10 mg via INTRAVENOUS
  Filled 2022-01-08: qty 2

## 2022-01-08 MED ORDER — SODIUM CHLORIDE 0.9 % IV BOLUS
1000.0000 mL | Freq: Once | INTRAVENOUS | Status: AC
  Administered 2022-01-08: 1000 mL via INTRAVENOUS

## 2022-01-08 MED ORDER — DIPHENHYDRAMINE HCL 50 MG/ML IJ SOLN
25.0000 mg | Freq: Once | INTRAMUSCULAR | Status: AC
  Administered 2022-01-08: 25 mg via INTRAVENOUS
  Filled 2022-01-08: qty 1

## 2022-01-08 MED ORDER — IOHEXOL 350 MG/ML SOLN
100.0000 mL | Freq: Once | INTRAVENOUS | Status: AC | PRN
Start: 2022-01-08 — End: 2022-01-08
  Administered 2022-01-08: 75 mL via INTRAVENOUS

## 2022-01-08 MED ORDER — KETOROLAC TROMETHAMINE 15 MG/ML IJ SOLN
15.0000 mg | Freq: Once | INTRAMUSCULAR | Status: AC
  Administered 2022-01-08: 15 mg via INTRAVENOUS
  Filled 2022-01-08: qty 1

## 2022-01-08 NOTE — Discharge Instructions (Signed)
Your symptoms seem to be coming from a concussion.  You should follow-up with neurology and the eye specialist for further evaluation as an outpatient.  If you develop new or worsening headache, vision changes, vomiting, weakness or numbness, or any other new/concerning symptoms then return to the ER for evaluation.
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
## 2022-01-08 NOTE — ED Notes (Signed)
Pt has resting quietly with eyes closed. Requested warm blanket.

## 2022-01-13 DIAGNOSIS — M25561 Pain in right knee: Secondary | ICD-10-CM | POA: Insufficient documentation

## 2022-01-16 ENCOUNTER — Encounter (HOSPITAL_COMMUNITY): Payer: Self-pay | Admitting: Emergency Medicine

## 2022-01-16 ENCOUNTER — Emergency Department (HOSPITAL_COMMUNITY): Payer: Medicare Other

## 2022-01-16 ENCOUNTER — Other Ambulatory Visit: Payer: Self-pay

## 2022-01-16 ENCOUNTER — Emergency Department (HOSPITAL_COMMUNITY)
Admission: EM | Admit: 2022-01-16 | Discharge: 2022-01-16 | Disposition: A | Discharge status: Home / Self Care | Payer: Medicare Other | Attending: Emergency Medicine | Admitting: Emergency Medicine

## 2022-01-16 DIAGNOSIS — M79672 Pain in left foot: Secondary | ICD-10-CM | POA: Insufficient documentation

## 2022-01-16 DIAGNOSIS — R2242 Localized swelling, mass and lump, left lower limb: Secondary | ICD-10-CM | POA: Insufficient documentation

## 2022-01-16 DIAGNOSIS — M79675 Pain in left toe(s): Secondary | ICD-10-CM | POA: Diagnosis not present

## 2022-01-16 DIAGNOSIS — Z79899 Other long term (current) drug therapy: Secondary | ICD-10-CM | POA: Diagnosis not present

## 2022-01-16 DIAGNOSIS — Z7982 Long term (current) use of aspirin: Secondary | ICD-10-CM | POA: Diagnosis not present

## 2022-01-16 MED ORDER — DOXYCYCLINE HYCLATE 100 MG PO CAPS: 100.0000 mg | ORAL_CAPSULE | Freq: Two times a day (BID) | ORAL | 0 refills | Status: AC

## 2022-01-16 MED ORDER — DOXYCYCLINE HYCLATE 100 MG PO TABS
100.0000 mg | ORAL_TABLET | Freq: Once | ORAL | Status: AC
  Administered 2022-01-16: 100 mg via ORAL
  Filled 2022-01-16: qty 1

## 2022-01-16 NOTE — Discharge Instructions (Addendum)
Please follow up with the podiatrist this afternoon as scheduled.  You will take antibiotics for the next 7 days as prescribed for cellulitis or skin infection.

## 2022-01-16 NOTE — ED Provider Notes (Signed)
[facility] EMERGENCY DEPARTMENT Provider Note   CSN: [id number] Arrival date & time: 01/16/22  6147     History  Chief Complaint  Patient presents with   Foot Pain    Joseph Pruitt is a 56 y.o. male presenting to ED with left foot pain.  Reports pain and swelling in his left 3rd toe for about 2 days.  He is not certain whether he stubbed the toe or injured it; states "maybe."  He questions a possible bug bite too.  But the redness and swelling are extending up his dorsum of the foot, painful to walk.  He has an appointment with a podiatrist today at 2 pm.  Denies hx of diabetes or neuropathy in his feet.  HPI     Home Medications Prior to Admission medications   Medication Sig Start Date End Date Taking? Authorizing Provider  aspirin 81 MG EC tablet Take 1 tablet by mouth daily. 06/10/16  Yes [provider]  doxycycline (VIBRAMYCIN) 100 MG capsule Take 1 capsule (100 mg total) by mouth 2 (two) times daily for 7 days. 01/16/22 01/23/22 Yes Taquana Bartley, Carola Rhine, MD  hydrochlorothiazide (HYDRODIURIL) 25 MG tablet Take 25 mg by mouth daily.   Yes [provider]  ibuprofen (ADVIL) 800 MG tablet Take 1 tablet (800 mg total) by mouth every 8 (eight) hours as needed for moderate pain. 09/10/19  Yes Milton Ferguson, MD  oxyCODONE-acetaminophen (PERCOCET/ROXICET) 5-325 MG tablet Take 1 tablet by mouth every 6 (six) hours as needed for moderate pain or severe pain.  11/22/14  Yes [provider]  cyclobenzaprine (FLEXERIL) 10 MG tablet Take 1 tablet (10 mg total) by mouth 2 (two) times daily as needed for muscle spasms. Patient not taking: Reported on 01/16/2022 05/21/21   Marcello Fennel, PA-C  fluticasone [facility]) 50 MCG/ACT nasal spray Place 1 spray into both nostrils daily. 10/05/14 05/01/21  [provider]  fluticasone (FLONASE) 50 MCG/ACT nasal spray Place 1 spray into both nostrils daily. Patient not taking: Reported on 01/16/2022 06/29/21   Horton,  Barbette Hair, MD  hydrOXYzine (ATARAX) 25 MG tablet Take 1 tablet (25 mg total) by mouth every 6 (six) hours as needed for itching. Patient not taking: Reported on 01/16/2022 09/06/21   Milton Ferguson, MD  loratadine (CLARITIN) 10 MG tablet Take 1 tablet (10 mg total) by mouth daily. Patient not taking: Reported on 01/16/2022 06/29/21   Horton, Barbette Hair, MD  naproxen (NAPROSYN) 375 MG tablet Take 1 tablet (375 mg total) by mouth 2 (two) times daily. Patient not taking: Reported on 01/16/2022 07/11/20   Horton, Alvin Critchley, DO      Allergies    Patient has no known allergies.    Review of Systems   Review of Systems  Physical Exam Updated Vital Signs BP (!) 148/102   Pulse 76   Temp 98.1 F (36.7 C) (Oral)   Resp 17   SpO2 97%  Physical Exam Constitutional:      General: He is not in acute distress. HENT:     Head: Normocephalic and atraumatic.  Eyes:     Conjunctiva/sclera: Conjunctivae normal.     Pupils: Pupils are equal, round, and reactive to light.  Cardiovascular:     Rate and Rhythm: Normal rate and regular rhythm.  Pulmonary:     Effort: Pulmonary effort is normal. No respiratory distress.  Skin:    General: Skin is warm and dry.     Comments: Tenderness, redness overlying PIP of  left 3rd toe, mild edema and redness of the dorsum of the left foot  Neurological:     General: No focal deficit present.     Mental Status: He is alert. Mental status is at baseline.  Psychiatric:        Mood and Affect: Mood normal.        Behavior: Behavior normal.     ED Results / Procedures / Treatments   Labs (all labs ordered are listed, but only abnormal results are displayed) Labs Reviewed - No data to display  EKG None  Radiology DG Foot Complete Left  Result Date: 01/16/2022 CLINICAL DATA:  Acute left foot pain and swelling without known injury. EXAM: LEFT FOOT - COMPLETE 3+ VIEW COMPARISON:  None Available. FINDINGS: There is no evidence of fracture or dislocation.  Moderate posterior calcaneal spurring is noted. Surgical screw is seen in the posterior portion of the calcaneus. Soft tissues are unremarkable. IMPRESSION: No acute abnormality seen. Electronically Signed   By: Marijo Conception M.D.   On: 01/16/2022 08:53    Procedures Procedures    Medications Ordered in ED Medications  doxycycline (VIBRA-TABS) tablet 100 mg (100 mg Oral Given 01/16/22 0851)    ED Course/ Medical Decision Making/ A&P                           Medical Decision Making Amount and/or Complexity of Data Reviewed Radiology: ordered.  Risk Prescription drug management.   Toe pain may be traumatic (fx) vs infection vs gout vs other  Xrays personally interpreted showing no acute fracture  Doxycycline ordered for skin and soft tissue infection x 7 days. This may have been seeded by a big bite.  I can't appreciate a drainable collection at this time, but I advised f/u with podiatrist this afternoon for a 2nd look  Lower suspicion for osteomyelitis; this is not a paronychia.          Final Clinical Impression(s) / ED Diagnoses Final diagnoses:  Toe pain, left    Rx / DC Orders ED Discharge Orders          Ordered    doxycycline (VIBRAMYCIN) 100 MG capsule  2 times daily        01/16/22 0908              Wyvonnia Dusky, MD 01/16/22 [phone]

## 2022-01-16 NOTE — ED Triage Notes (Signed)
Pt c/o left foot pain x 2 days ago. Thinks something may have bitten him on distal middle toe. Swelling and redness noted to toes and going up foot. Pain to touch

## 2022-03-05 DIAGNOSIS — M545 Low back pain, unspecified: Secondary | ICD-10-CM | POA: Insufficient documentation

## 2022-03-05 DIAGNOSIS — M47812 Spondylosis without myelopathy or radiculopathy, cervical region: Secondary | ICD-10-CM | POA: Insufficient documentation

## 2022-03-26 DIAGNOSIS — M5412 Radiculopathy, cervical region: Secondary | ICD-10-CM | POA: Insufficient documentation

## 2022-04-07 ENCOUNTER — Emergency Department (HOSPITAL_COMMUNITY)
Admission: EM | Admit: 2022-04-07 | Discharge: 2022-04-07 | Disposition: A | Discharge status: Home / Self Care | Payer: Medicare Other | Attending: Emergency Medicine | Admitting: Emergency Medicine

## 2022-04-07 ENCOUNTER — Other Ambulatory Visit: Payer: Self-pay

## 2022-04-07 ENCOUNTER — Encounter (HOSPITAL_COMMUNITY): Payer: Self-pay | Admitting: Emergency Medicine

## 2022-04-07 DIAGNOSIS — G8929 Other chronic pain: Secondary | ICD-10-CM | POA: Insufficient documentation

## 2022-04-07 DIAGNOSIS — I10 Essential (primary) hypertension: Secondary | ICD-10-CM | POA: Diagnosis not present

## 2022-04-07 DIAGNOSIS — M6530 Trigger finger, unspecified finger: Secondary | ICD-10-CM | POA: Insufficient documentation

## 2022-04-07 DIAGNOSIS — Z7982 Long term (current) use of aspirin: Secondary | ICD-10-CM | POA: Insufficient documentation

## 2022-04-07 DIAGNOSIS — R519 Headache, unspecified: Secondary | ICD-10-CM | POA: Insufficient documentation

## 2022-04-07 DIAGNOSIS — Z85038 Personal history of other malignant neoplasm of large intestine: Secondary | ICD-10-CM | POA: Diagnosis not present

## 2022-04-07 LAB — CBC WITH DIFFERENTIAL/PLATELET
Abs Immature Granulocytes: 0.01 10*3/uL (ref 0.00–0.07)
Basophils Absolute: 0 10*3/uL (ref 0.0–0.1)
Basophils Relative: 0 %
Eosinophils Absolute: 0.1 10*3/uL (ref 0.0–0.5)
Eosinophils Relative: 2 %
HCT: 43.4 % (ref 39.0–52.0)
Hemoglobin: 14.6 g/dL (ref 13.0–17.0)
Immature Granulocytes: 0 %
Lymphocytes Relative: 40 %
Lymphs Abs: 1.5 10*3/uL (ref 0.7–4.0)
MCH: 30.5 pg (ref 26.0–34.0)
MCHC: 33.6 g/dL (ref 30.0–36.0)
MCV: 90.6 fL (ref 80.0–100.0)
Monocytes Absolute: 0.4 10*3/uL (ref 0.1–1.0)
Monocytes Relative: 10 %
Neutro Abs: 1.8 10*3/uL (ref 1.7–7.7)
Neutrophils Relative %: 48 %
Platelets: 173 10*3/uL (ref 150–400)
RBC: 4.79 MIL/uL (ref 4.22–5.81)
RDW: 12.3 % (ref 11.5–15.5)
WBC: 3.8 10*3/uL — ABNORMAL LOW (ref 4.0–10.5)
nRBC: 0 % (ref 0.0–0.2)

## 2022-04-07 LAB — SEDIMENTATION RATE: Sed Rate: 7 mm/hr (ref 0–16)

## 2022-04-07 LAB — BASIC METABOLIC PANEL
Anion gap: 9 (ref 5–15)
BUN: 16 mg/dL (ref 6–20)
CO2: 26 mmol/L (ref 22–32)
Calcium: 8.6 mg/dL — ABNORMAL LOW (ref 8.9–10.3)
Chloride: 101 mmol/L (ref 98–111)
Creatinine, Ser: 0.89 mg/dL (ref 0.61–1.24)
GFR, Estimated: >60 mL/min (ref >60)
Glucose, Bld: 151 mg/dL — ABNORMAL HIGH (ref 70–99)
Potassium: 3.3 mmol/L — ABNORMAL LOW (ref 3.5–5.1)
Sodium: 136 mmol/L (ref 135–145)

## 2022-04-07 MED ORDER — HYDROCODONE-ACETAMINOPHEN 5-325 MG PO TABS
1.0000 | ORAL_TABLET | Freq: Once | ORAL | Status: DC
  Filled 2022-04-07: qty 1

## 2022-04-07 MED ORDER — ACETAMINOPHEN 500 MG PO TABS
1000.0000 mg | ORAL_TABLET | Freq: Once | ORAL | Status: AC
  Administered 2022-04-07: 1000 mg via ORAL
  Filled 2022-04-07: qty 2

## 2022-04-07 NOTE — ED Triage Notes (Signed)
Pt c/o headache x 3 days, denies all other s/s

## 2022-04-07 NOTE — ED Notes (Signed)
Called lab to check on sed rate results. Stated running now

## 2022-04-07 NOTE — ED Notes (Addendum)
Pt stated he had to leave to get his daughter and would come back if necessary. Gave him phone number to call us. Pt left in nad and stated he felt better.
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
## 2022-06-16 ENCOUNTER — Emergency Department (HOSPITAL_COMMUNITY)
Admission: EM | Admit: 2022-06-16 | Discharge: 2022-06-16 | Disposition: A | Discharge status: Home / Self Care | Payer: Medicare Other | Attending: Emergency Medicine | Admitting: Emergency Medicine

## 2022-06-16 ENCOUNTER — Encounter (HOSPITAL_COMMUNITY): Payer: Self-pay | Admitting: *Deleted

## 2022-06-16 ENCOUNTER — Other Ambulatory Visit: Payer: Self-pay

## 2022-06-16 DIAGNOSIS — Z7982 Long term (current) use of aspirin: Secondary | ICD-10-CM | POA: Insufficient documentation

## 2022-06-16 DIAGNOSIS — E1165 Type 2 diabetes mellitus with hyperglycemia: Secondary | ICD-10-CM | POA: Insufficient documentation

## 2022-06-16 DIAGNOSIS — Z7984 Long term (current) use of oral hypoglycemic drugs: Secondary | ICD-10-CM | POA: Diagnosis not present

## 2022-06-16 DIAGNOSIS — R739 Hyperglycemia, unspecified: Secondary | ICD-10-CM

## 2022-06-16 DIAGNOSIS — R42 Dizziness and giddiness: Secondary | ICD-10-CM | POA: Diagnosis present

## 2022-06-16 LAB — CBC WITH DIFFERENTIAL/PLATELET
Abs Immature Granulocytes: 0.01 10*3/uL (ref 0.00–0.07)
Basophils Absolute: 0 10*3/uL (ref 0.0–0.1)
Basophils Relative: 1 %
Eosinophils Absolute: 0.1 10*3/uL (ref 0.0–0.5)
Eosinophils Relative: 2 %
HCT: 47.3 % (ref 39.0–52.0)
Hemoglobin: 16.9 g/dL (ref 13.0–17.0)
Immature Granulocytes: 0 %
Lymphocytes Relative: 46 %
Lymphs Abs: 2.1 10*3/uL (ref 0.7–4.0)
MCH: 30.9 pg (ref 26.0–34.0)
MCHC: 35.7 g/dL (ref 30.0–36.0)
MCV: 86.5 fL (ref 80.0–100.0)
Monocytes Absolute: 0.4 10*3/uL (ref 0.1–1.0)
Monocytes Relative: 9 %
Neutro Abs: 1.9 10*3/uL (ref 1.7–7.7)
Neutrophils Relative %: 42 %
Platelets: 169 10*3/uL (ref 150–400)
RBC: 5.47 MIL/uL (ref 4.22–5.81)
RDW: 11.7 % (ref 11.5–15.5)
WBC: 4.4 10*3/uL (ref 4.0–10.5)
nRBC: 0 % (ref 0.0–0.2)

## 2022-06-16 LAB — COMPREHENSIVE METABOLIC PANEL
ALT: 42 U/L (ref 0–44)
AST: 32 U/L (ref 15–41)
Albumin: 4 g/dL (ref 3.5–5.0)
Alkaline Phosphatase: 145 U/L — ABNORMAL HIGH (ref 38–126)
Anion gap: 11 (ref 5–15)
BUN: 20 mg/dL (ref 6–20)
CO2: 28 mmol/L (ref 22–32)
Calcium: 9.4 mg/dL (ref 8.9–10.3)
Chloride: 88 mmol/L — ABNORMAL LOW (ref 98–111)
Creatinine, Ser: 1.18 mg/dL (ref 0.61–1.24)
GFR, Estimated: >60 mL/min (ref >60)
Glucose, Bld: 597 mg/dL (ref 70–99)
Potassium: 4.3 mmol/L (ref 3.5–5.1)
Sodium: 127 mmol/L — ABNORMAL LOW (ref 135–145)
Total Bilirubin: 1.1 mg/dL (ref 0.3–1.2)
Total Protein: 8.7 g/dL — ABNORMAL HIGH (ref 6.5–8.1)

## 2022-06-16 LAB — CBG MONITORING, ED: Glucose-Capillary: 559 mg/dL (ref 70–99)

## 2022-06-16 MED ORDER — METFORMIN HCL 500 MG PO TABS: 500.0000 mg | ORAL_TABLET | Freq: Two times a day (BID) | ORAL | 0 refills | Status: AC

## 2022-06-16 MED ORDER — INSULIN ASPART 100 UNIT/ML IJ SOLN
10.0000 [IU] | Freq: Once | INTRAMUSCULAR | Status: AC
  Administered 2022-06-16: 10 [IU] via SUBCUTANEOUS
  Filled 2022-06-16: qty 1

## 2022-06-16 MED ORDER — SODIUM CHLORIDE 0.9 % IV BOLUS
1000.0000 mL | Freq: Once | INTRAVENOUS | Status: AC
  Administered 2022-06-16: 1000 mL via INTRAVENOUS

## 2022-06-16 NOTE — ED Notes (Signed)
Patients Blood sugar is 428

## 2022-06-16 NOTE — Discharge Instructions (Addendum)
Drink plenty of fluid and follow-up with your family doctor this week.

## 2022-06-16 NOTE — ED Triage Notes (Signed)
Pt c/o weakness, dizziness and dry mouth x 3 days

## 2022-06-17 ENCOUNTER — Encounter: Payer: Self-pay | Admitting: Emergency Medicine

## 2022-06-17 ENCOUNTER — Ambulatory Visit
Admission: EM | Admit: 2022-06-17 | Discharge: 2022-06-17 | Disposition: A | Discharge status: Home / Self Care | Payer: Medicare Other | Attending: Nurse Practitioner | Admitting: Nurse Practitioner

## 2022-06-17 DIAGNOSIS — R739 Hyperglycemia, unspecified: Secondary | ICD-10-CM

## 2022-06-17 DIAGNOSIS — E119 Type 2 diabetes mellitus without complications: Secondary | ICD-10-CM | POA: Insufficient documentation

## 2022-06-17 LAB — POCT FASTING CBG KUC MANUAL ENTRY: POCT Glucose (KUC): 352 mg/dL — AB (ref 70–99)

## 2022-06-17 NOTE — ED Provider Notes (Signed)
[facility]    CSN: [id number] Arrival date & time: 06/17/22  1106      History   Chief Complaint No chief complaint on file.   HPI Joseph Pruitt is a 57 y.o. male.   Patient presents today to have his blood sugar checked.  Reports he went to the emergency room yesterday and his blood sugar was over 500.  He met with his primary care provider this morning who ordered him a glucometer, however he has not picked it up yet from the pharmacy.  States that his primary care provider wants him checking his blood sugar in the morning in the evening.  Reports today, he wants to make sure his blood sugar is improving.  He is extremely motivated today to work on dietary and lifestyle changes.  He was prescribed metformin in the emergency room and by his primary care provider but is resistant to take it because he does not want to take another medication.  Reports he has never been formally told he has diabetes.  Today, he denies headache, blurred vision, double vision, chest pain or shortness of breath, lower extremity numbness or tingling, and lower extremity swelling.    Past Medical History:  Diagnosis Date   Cancer    colon cancer   Cancer    Environmental allergies    High cholesterol    Hypertension    Stomach ulcer     Patient Active Problem List   Diagnosis Date Noted   Head ache 09/21/2013   Pain in joint, ankle and foot 01/20/2013   Left leg weakness 01/20/2013   CARPAL TUNNEL SYNDROME, HX OF 02/14/2009   MONONEURITIS OF UNSPECIFIED SITE 11/22/2008   SPONDYLOSIS, CERVICAL, WITH RADICULOPATHY 11/22/2008   TENDINITIS, RIGHT WRIST 11/22/2008   NECK PAIN 08/14/2008    Past Surgical History:  Procedure Laterality Date   ACHILLES TENDON SURGERY     CERVICAL LAMINECTOMY     COLON RESECTION     HAND SURGERY     SHOULDER ARTHROSCOPY Right    SPINE SURGERY         Home Medications    Prior to Admission medications   Medication Sig Start Date End Date  Taking? Authorizing Provider  aspirin 81 MG EC tablet Take 1 tablet by mouth daily. 06/10/16   [provider]  cyclobenzaprine (FLEXERIL) 10 MG tablet Take 1 tablet (10 mg total) by mouth 2 (two) times daily as needed for muscle spasms. Patient not taking: Reported on 01/16/2022 05/21/21   Carroll SageFaulkner, William J, PA-C  fluticasone [facility](FLONASE) 50 MCG/ACT nasal spray Place 1 spray into both nostrils daily. 10/05/14 05/01/21  [provider]  fluticasone (FLONASE) 50 MCG/ACT nasal spray Place 1 spray into both nostrils daily. Patient not taking: Reported on 01/16/2022 06/29/21   Horton, Mayer Maskerourtney F, MD  hydrochlorothiazide (HYDRODIURIL) 25 MG tablet Take 25 mg by mouth daily.    [provider]  hydrOXYzine (ATARAX) 25 MG tablet Take 1 tablet (25 mg total) by mouth every 6 (six) hours as needed for itching. Patient not taking: Reported on 01/16/2022 09/06/21   Bethann BerkshireZammit, Joseph, MD  ibuprofen (ADVIL) 800 MG tablet Take 1 tablet (800 mg total) by mouth every 8 (eight) hours as needed for moderate pain. Patient not taking: Reported on 06/16/2022 09/10/19   Bethann BerkshireZammit, Joseph, MD  loratadine (CLARITIN) 10 MG tablet Take 1 tablet (10 mg total) by mouth daily. Patient not taking: Reported on 01/16/2022 06/29/21   Horton, Mayer Maskerourtney F, MD  metFORMIN (GLUCOPHAGE) 500 MG tablet Take 1 tablet (500 mg total) by mouth 2 (two) times daily with a meal. 06/16/22   Bethann Berkshire, MD  naproxen (NAPROSYN) 375 MG tablet Take 1 tablet (375 mg total) by mouth 2 (two) times daily. Patient not taking: Reported on 01/16/2022 07/11/20   Horton, Clabe Seal, DO  oxyCODONE-acetaminophen (PERCOCET/ROXICET) 5-325 MG tablet Take 1 tablet by mouth every 6 (six) hours as needed for moderate pain or severe pain.  11/22/14   [provider]    Family History History reviewed. No pertinent family history.  Social History Social History   Tobacco Use   Smoking status: Never   Smokeless tobacco: Never  Vaping Use   Vaping  Use: Some days   Substances: Nicotine, Flavoring  Substance Use Topics   Alcohol use: Never   Drug use: Never     Allergies   Patient has no known allergies.   Review of Systems Review of Systems Per HPI  Physical Exam Triage Vital Signs ED Triage Vitals  Enc Vitals Group     BP 06/17/22 1113 125/86     Pulse Rate 06/17/22 1113 87     Resp 06/17/22 1113 18     Temp 06/17/22 1113 97.9 F (36.6 C)     Temp Source 06/17/22 1113 Oral     SpO2 06/17/22 1113 95 %     Weight --      Height --      Head Circumference --      Peak Flow --      Pain Score 06/17/22 1114 0     Pain Loc --      Pain Edu? --      Excl. in GC? --    No data found.  Updated Vital Signs BP 125/86 (BP Location: Right Arm)   Pulse 87   Temp 97.9 F (36.6 C) (Oral)   Resp 18   SpO2 95%   Visual Acuity Right Eye Distance:   Left Eye Distance:   Bilateral Distance:    Right Eye Near:   Left Eye Near:    Bilateral Near:     Physical Exam Vitals and nursing note reviewed.  Constitutional:      General: He is not in acute distress.    Appearance: Normal appearance. He is not ill-appearing or toxic-appearing.  HENT:     Head: Normocephalic and atraumatic.     Right Ear: External ear normal.     Left Ear: External ear normal.     Nose: No congestion or rhinorrhea.     Mouth/Throat:     Mouth: Mucous membranes are moist.     Pharynx: Oropharynx is clear.  Eyes:     General: No scleral icterus.    Extraocular Movements: Extraocular movements intact.  Pulmonary:     Effort: Pulmonary effort is normal. No respiratory distress.  Skin:    General: Skin is warm and dry.     Coloration: Skin is not jaundiced or pale.     Findings: No erythema or rash.  Neurological:     Mental Status: He is alert and oriented to person, place, and time.  Psychiatric:        Behavior: Behavior is cooperative.      UC Treatments / Results  Labs (all labs ordered are listed, but only abnormal results  are displayed) Labs Reviewed  POCT FASTING CBG [facility] MANUAL ENTRY - Abnormal; Notable for the following components:  Result Value   POCT Glucose ([facility]) 352 (*)    All other components within normal limits    EKG   Radiology No results found.  Procedures Procedures (including critical care time)  Medications Ordered in UC Medications - No data to display  Initial Impression / Assessment and Plan / UC Course  I have reviewed the triage vital signs and the nursing notes.  Pertinent labs & imaging results that were available during my care of the patient were reviewed by me and considered in my medical decision making (see chart for details).   Patient is well-appearing, normotensive, afebrile, not tachycardic, not tachypneic, oxygenating well on room air.    1. Hyperglycemia Discussed with patient that blood sugar today is 352 Discussed dietary and lifestyle changes and continued follow-up with primary care provider Reiterated that it does sound like he has been diagnosed with type 2 diabetes and he should begin the metformin He should also pick up the prescription for the glucometer from the pharmacy and begin using it as directed by his primary care provider and I discussed this with the patient  The patient was given the opportunity to ask questions.  All questions answered to their satisfaction.  The patient is in agreement to this plan.    Final Clinical Impressions(s) / UC Diagnoses   Final diagnoses:  Hyperglycemia     Discharge Instructions      The blood sugar today was 352.  Please check your blood sugar at home with the glucometer your PCP sent to the pharmacy and keep a log as directed by your PCP  Recommend starting the Metformin to treat diabetes and avoiding sugary and sweet foods.   Follow up with your PCP as planned    ED Prescriptions   None    PDMP not reviewed this encounter.   Valentino Nose, NP 06/17/22 1149

## 2022-06-17 NOTE — Discharge Instructions (Addendum)
The blood sugar today was 352.  Please check your blood sugar at home with the glucometer your PCP sent to the pharmacy and keep a log as directed by your PCP  Recommend starting the Metformin to treat diabetes and avoiding sugary and sweet foods.   Follow up with your PCP as planned

## 2022-06-17 NOTE — ED Triage Notes (Signed)
States was seen in ED yesterday for blood sugar over 500.  States he had not been eating well recently.  States he was prescribed metformin and has not been taking it.

## 2022-06-18 NOTE — ED Provider Notes (Signed)
[place] EMERGENCY DEPARTMENT AT [facility] Provider Note   CSN: [id number] Arrival date & time: 06/16/22  0932     History  Chief Complaint  Patient presents with   Dizziness    Joseph Pruitt is a 57 y.o. male.  Patient comes into the emergency department feeling weak.  Also states he has been urinating a lot  The history is provided by the patient and medical records. No language interpreter was used.  Dizziness Quality:  Lightheadedness Severity:  Moderate Onset quality:  Sudden Timing:  Constant Progression:  Worsening Chronicity:  New Context: not when bending over   Relieved by:  Nothing Worsened by:  Nothing Ineffective treatments:  None tried Associated symptoms: no chest pain, no diarrhea and no headaches        Home Medications Prior to Admission medications   Medication Sig Start Date End Date Taking? Authorizing Provider  aspirin 81 MG EC tablet Take 1 tablet by mouth daily. 06/10/16  Yes [provider]  hydrochlorothiazide (HYDRODIURIL) 25 MG tablet Take 25 mg by mouth daily.   Yes [provider]  metFORMIN (GLUCOPHAGE) 500 MG tablet Take 1 tablet (500 mg total) by mouth 2 (two) times daily with a meal. 06/16/22  Yes Bethann Berkshire, MD  oxyCODONE-acetaminophen (PERCOCET/ROXICET) 5-325 MG tablet Take 1 tablet by mouth every 6 (six) hours as needed for moderate pain or severe pain.  11/22/14  Yes [provider]  cyclobenzaprine (FLEXERIL) 10 MG tablet Take 1 tablet (10 mg total) by mouth 2 (two) times daily as needed for muscle spasms. Patient not taking: Reported on 01/16/2022 05/21/21   Carroll Sage, PA-C  fluticasone [facility]) 50 MCG/ACT nasal spray Place 1 spray into both nostrils daily. 10/05/14 05/01/21  [provider]  fluticasone (FLONASE) 50 MCG/ACT nasal spray Place 1 spray into both nostrils daily. Patient not taking: Reported on 01/16/2022 06/29/21   Horton, Mayer Masker, MD  hydrOXYzine (ATARAX) 25  MG tablet Take 1 tablet (25 mg total) by mouth every 6 (six) hours as needed for itching. Patient not taking: Reported on 01/16/2022 09/06/21   Bethann Berkshire, MD  ibuprofen (ADVIL) 800 MG tablet Take 1 tablet (800 mg total) by mouth every 8 (eight) hours as needed for moderate pain. Patient not taking: Reported on 06/16/2022 09/10/19   Bethann Berkshire, MD  loratadine (CLARITIN) 10 MG tablet Take 1 tablet (10 mg total) by mouth daily. Patient not taking: Reported on 01/16/2022 06/29/21   Horton, Mayer Masker, MD  naproxen (NAPROSYN) 375 MG tablet Take 1 tablet (375 mg total) by mouth 2 (two) times daily. Patient not taking: Reported on 01/16/2022 07/11/20   Horton, Clabe Seal, DO      Allergies    Patient has no known allergies.    Review of Systems   Review of Systems  Constitutional:  Negative for appetite change and fatigue.  HENT:  Negative for congestion, ear discharge and sinus pressure.   Eyes:  Negative for discharge.  Respiratory:  Negative for cough.   Cardiovascular:  Negative for chest pain.  Gastrointestinal:  Negative for abdominal pain and diarrhea.  Genitourinary:  Negative for frequency and hematuria.  Musculoskeletal:  Negative for back pain.  Skin:  Negative for rash.  Neurological:  Positive for dizziness. Negative for seizures and headaches.  Psychiatric/Behavioral:  Negative for hallucinations.     Physical Exam Updated Vital Signs BP 118/88 (BP Location: Left Arm)   Pulse 69   Temp 97.8 F (36.6 C) (  Oral)   Resp 20   Ht 6\' 1"  (1.854 m)   Wt 121.1 kg   SpO2 100%   BMI 35.23 kg/m  Physical Exam Vitals and nursing note reviewed.  Constitutional:      Appearance: He is well-developed.  HENT:     Head: Normocephalic.     Nose: Nose normal.  Eyes:     General: No scleral icterus.    Conjunctiva/sclera: Conjunctivae normal.  Neck:     Thyroid: No thyromegaly.  Cardiovascular:     Rate and Rhythm: Normal rate and regular rhythm.     Heart sounds: No murmur  heard.    No friction rub. No gallop.  Pulmonary:     Breath sounds: No stridor. No wheezing or rales.  Chest:     Chest wall: No tenderness.  Abdominal:     General: There is no distension.     Tenderness: There is no abdominal tenderness. There is no rebound.  Musculoskeletal:        General: Normal range of motion.     Cervical back: Neck supple.  Lymphadenopathy:     Cervical: No cervical adenopathy.  Skin:    Findings: No erythema or rash.  Neurological:     Mental Status: He is alert and oriented to person, place, and time.     Motor: No abnormal muscle tone.     Coordination: Coordination normal.  Psychiatric:        Behavior: Behavior normal.     ED Results / Procedures / Treatments   Labs (all labs ordered are listed, but only abnormal results are displayed) Labs Reviewed  COMPREHENSIVE METABOLIC PANEL - Abnormal; Notable for the following components:      Result Value   Sodium 127 (*)    Chloride 88 (*)    Glucose, Bld 597 (*)    Total Protein 8.7 (*)    Alkaline Phosphatase 145 (*)    All other components within normal limits  CBG MONITORING, ED - Abnormal; Notable for the following components:   Glucose-Capillary 559 (*)    All other components within normal limits  CBC WITH DIFFERENTIAL/PLATELET  CBG MONITORING, ED    EKG None  Radiology No results found.  Procedures Procedures   Medications Ordered in ED Medications  sodium chloride 0.9 % bolus 1,000 mL (0 mLs Intravenous Stopped 06/16/22 1224)  sodium chloride 0.9 % bolus 1,000 mL (0 mLs Intravenous Stopped 06/16/22 1329)  insulin aspart (novoLOG) injection 10 Units (10 Units Subcutaneous Given 06/16/22 1201)    ED Course/ Medical Decision Making/ A&P   {  This patient presents to the ED for concern of weakness and dizziness, this involves an extensive number of treatment options, and is a complaint that carries with it a high risk of complications and morbidity.  The differential diagnosis  includes pneumonia, sepsis                            Medical Decision Making Amount and/or Complexity of Data Reviewed Labs: ordered.  Risk Prescription drug management.  This patient presents to the ED for concern of weakness, this involves an extensive number of treatment options, and is a complaint that carries with it a high risk of complications and morbidity.  The differential diagnosis includes anemia, sepsis   Co morbidities that complicate the patient evaluation  Hypertension   Additional history obtained:  Additional history obtained from patient External records from outside source  obtained and reviewed including hospital records   Lab Tests:  I Ordered, and personally interpreted labs.  The pertinent results include: Glucose 600   Imaging Studies ordered: No imaging Cardiac Monitoring: / EKG:  The patient was maintained on a cardiac monitor.  I personally viewed and interpreted the cardiac monitored which showed an underlying rhythm of: Sinus tach   Consultations Obtained:  No consultant  Problem List / ED Course / Critical interventions / Medication management  Pretension diabetes I ordered medication including insulin and normal saline Reevaluation of the patient after these medicines showed that the patient improved I have reviewed the patients home medicines and have made adjustments as needed   Social Determinants of Health:  None   Test / Admission - Considered:  None  Patient with new onset diabetes.  He is given fluids and metformin and will see Dr. Donn Pierinihe next day   Final Clinical Impression(s) / ED Diagnoses Final diagnoses:  Hyperglycemia    Rx / DC Orders ED Discharge Orders          Ordered    metFORMIN (GLUCOPHAGE) 500 MG tablet  2 times daily with meals        06/16/22 1446              Bethann BerkshireZammit, Leticia Coletta, MD 06/18/22 [id number]

## 2022-08-02 ENCOUNTER — Encounter (HOSPITAL_COMMUNITY): Payer: Self-pay | Admitting: *Deleted

## 2022-08-02 ENCOUNTER — Other Ambulatory Visit: Payer: Self-pay

## 2022-08-02 ENCOUNTER — Emergency Department (HOSPITAL_COMMUNITY)
Admission: EM | Admit: 2022-08-02 | Discharge: 2022-08-02 | Disposition: A | Discharge status: Home / Self Care | Payer: Medicare Other | Attending: Emergency Medicine | Admitting: Emergency Medicine

## 2022-08-02 DIAGNOSIS — Z85038 Personal history of other malignant neoplasm of large intestine: Secondary | ICD-10-CM | POA: Insufficient documentation

## 2022-08-02 DIAGNOSIS — R2241 Localized swelling, mass and lump, right lower limb: Secondary | ICD-10-CM | POA: Diagnosis present

## 2022-08-02 DIAGNOSIS — Z7982 Long term (current) use of aspirin: Secondary | ICD-10-CM | POA: Diagnosis not present

## 2022-08-02 DIAGNOSIS — I10 Essential (primary) hypertension: Secondary | ICD-10-CM | POA: Insufficient documentation

## 2022-08-02 DIAGNOSIS — M7989 Other specified soft tissue disorders: Secondary | ICD-10-CM

## 2022-08-02 LAB — CBC
HCT: 39 % (ref 39.0–52.0)
Hemoglobin: 13.1 g/dL (ref 13.0–17.0)
MCH: 31 pg (ref 26.0–34.0)
MCHC: 33.6 g/dL (ref 30.0–36.0)
MCV: 92.2 fL (ref 80.0–100.0)
Platelets: 176 10*3/uL (ref 150–400)
RBC: 4.23 MIL/uL (ref 4.22–5.81)
RDW: 12.8 % (ref 11.5–15.5)
WBC: 4.2 10*3/uL (ref 4.0–10.5)
nRBC: 0 % (ref 0.0–0.2)

## 2022-08-02 LAB — BASIC METABOLIC PANEL
Anion gap: 9 (ref 5–15)
BUN: 15 mg/dL (ref 6–20)
CO2: 27 mmol/L (ref 22–32)
Calcium: 8.8 mg/dL — ABNORMAL LOW (ref 8.9–10.3)
Chloride: 100 mmol/L (ref 98–111)
Creatinine, Ser: 0.96 mg/dL (ref 0.61–1.24)
GFR, Estimated: >60 mL/min (ref >60)
Glucose, Bld: 134 mg/dL — ABNORMAL HIGH (ref 70–99)
Potassium: 3.4 mmol/L — ABNORMAL LOW (ref 3.5–5.1)
Sodium: 136 mmol/L (ref 135–145)

## 2022-08-02 MED ORDER — ENOXAPARIN SODIUM 120 MG/0.8ML IJ SOSY
110.0000 mg | PREFILLED_SYRINGE | Freq: Once | INTRAMUSCULAR | Status: AC
  Administered 2022-08-02: 110 mg via SUBCUTANEOUS
  Filled 2022-08-02: qty 0.8

## 2022-08-02 NOTE — Discharge Instructions (Signed)
Follow-up tomorrow for the ultrasound. °

## 2022-08-02 NOTE — ED Triage Notes (Signed)
Pt with right lower leg swelling and ankle for past few days. Denies any injury to right leg.

## 2022-08-02 NOTE — ED Provider Notes (Signed)
[place] EMERGENCY [place] AT [facility] Provider Note   CSN: [id number] Arrival date & time: 08/02/22  1815     History  Chief Complaint  Patient presents with   Leg Swelling    Joseph Pruitt is a 57 y.o. male.  HPI Patient presents with swelling in his right lower leg.  Is had for around a week now.  Saw his PCP.  Reviewing notes it appears 2 days ago said to come into the ER.  Presents today.  No current chest pain.  No difficulty breathing.  No abdominal pain.  No blood in the stool or black stool.  States he has been on his knees for his work but no direct injury.   Past Medical History:  Diagnosis Date   Cancer (HCC)    colon cancer   Cancer (HCC)    Environmental allergies    High cholesterol    Hypertension    Stomach ulcer     Home Medications Prior to Admission medications   Medication Sig Start Date End Date Taking? Authorizing Provider  aspirin 81 MG EC tablet Take 1 tablet by mouth daily. 06/10/16   [provider]  cyclobenzaprine (FLEXERIL) 10 MG tablet Take 1 tablet (10 mg total) by mouth 2 (two) times daily as needed for muscle spasms. Patient not taking: Reported on 01/16/2022 05/21/21   Carroll Sage, PA-C  fluticasone [facility]) 50 MCG/ACT nasal spray Place 1 spray into both nostrils daily. 10/05/14 05/01/21  [provider]  fluticasone (FLONASE) 50 MCG/ACT nasal spray Place 1 spray into both nostrils daily. Patient not taking: Reported on 01/16/2022 06/29/21   Horton, Mayer Masker, MD  hydrochlorothiazide (HYDRODIURIL) 25 MG tablet Take 25 mg by mouth daily.    [provider]  hydrOXYzine (ATARAX) 25 MG tablet Take 1 tablet (25 mg total) by mouth every 6 (six) hours as needed for itching. Patient not taking: Reported on 01/16/2022 09/06/21   Bethann Berkshire, MD  ibuprofen (ADVIL) 800 MG tablet Take 1 tablet (800 mg total) by mouth every 8 (eight) hours as needed for moderate pain. Patient not taking: Reported on  06/16/2022 09/10/19   Bethann Berkshire, MD  loratadine (CLARITIN) 10 MG tablet Take 1 tablet (10 mg total) by mouth daily. Patient not taking: Reported on 01/16/2022 06/29/21   Horton, Mayer Masker, MD  metFORMIN (GLUCOPHAGE) 500 MG tablet Take 1 tablet (500 mg total) by mouth 2 (two) times daily with a meal. 06/16/22   Bethann Berkshire, MD  naproxen (NAPROSYN) 375 MG tablet Take 1 tablet (375 mg total) by mouth 2 (two) times daily. Patient not taking: Reported on 01/16/2022 07/11/20   Horton, Clabe Seal, DO  oxyCODONE-acetaminophen (PERCOCET/ROXICET) 5-325 MG tablet Take 1 tablet by mouth every 6 (six) hours as needed for moderate pain or severe pain.  11/22/14   [provider]      Allergies    Patient has no known allergies.    Review of Systems   Review of Systems  Physical Exam Updated Vital Signs BP (!) 139/94   Pulse (!) 59   Temp 97.7 F (36.5 C) (Oral)   Resp 18   Ht 6\' 1"  (1.854 m)   Wt 112.9 kg   SpO2 99%   BMI 32.85 kg/m  Physical Exam Vitals and nursing note reviewed.  Eyes:     Pupils: Pupils are equal, round, and reactive to light.  Abdominal:     Tenderness: There is no abdominal tenderness.  Musculoskeletal:  Comments: Edema right lower extremity.  Trace left lower extremity.  Slight abrasion medially on right lower leg.  No pain with movement of the knee or ankle.  Hyperpigmentation anterior on bilateral knees.  Skin:    General: Skin is warm.  Neurological:     Mental Status: He is alert and oriented to person, place, and time.     ED Results / Procedures / Treatments   Labs (all labs ordered are listed, but only abnormal results are displayed) Labs Reviewed  BASIC METABOLIC PANEL - Abnormal; Notable for the following components:      Result Value   Potassium 3.4 (*)    Glucose, Bld 134 (*)    Calcium 8.8 (*)    All other components within normal limits  CBC    EKG None  Radiology No results found.  Procedures Procedures    Medications  Ordered in ED Medications  enoxaparin (LOVENOX) injection 110 mg (110 mg Subcutaneous Given 08/02/22 1957)    ED Course/ Medical Decision Making/ A&P                             Medical Decision Making Amount and/or Complexity of Data Reviewed Labs: ordered.  Risk Prescription drug management.   Patient edema right lower extremity.  No chest pain or trouble breathing.  Doubt pulm embolism but DVT is on the differential.  Cellulitis also considered.  Will get basic blood work including CBC and basic metabolic panel.  Has had hyperglycemia in the past.  Reviewed with radiology and will have Doppler here tomorrow morning.  Blood work reassuring.  Doppler tomorrow.  Covered with Lovenox.        Final Clinical Impression(s) / ED Diagnoses Final diagnoses:  Right leg swelling    Rx / DC Orders ED Discharge Orders          Ordered    US Venous Img Lower Unilateral Right        08/02/22 1948              Benjiman Core, MD 08/02/22 2353

## 2022-08-03 ENCOUNTER — Ambulatory Visit (HOSPITAL_COMMUNITY)
Admission: RE | Admit: 2022-08-03 | Discharge: 2022-08-03 | Disposition: A | Discharge status: Home / Self Care | Payer: Medicare Other | Source: Ambulatory Visit | Attending: Emergency Medicine | Admitting: Emergency Medicine

## 2022-08-03 DIAGNOSIS — R600 Localized edema: Secondary | ICD-10-CM | POA: Diagnosis not present

## 2022-08-03 DIAGNOSIS — M79604 Pain in right leg: Secondary | ICD-10-CM | POA: Insufficient documentation

## 2022-08-06 ENCOUNTER — Other Ambulatory Visit: Payer: Self-pay

## 2022-08-06 ENCOUNTER — Emergency Department (HOSPITAL_COMMUNITY): Payer: Medicare Other

## 2022-08-06 ENCOUNTER — Emergency Department (HOSPITAL_COMMUNITY)
Admission: EM | Admit: 2022-08-06 | Discharge: 2022-08-06 | Disposition: A | Discharge status: Home / Self Care | Payer: Medicare Other | Attending: Emergency Medicine | Admitting: Emergency Medicine

## 2022-08-06 DIAGNOSIS — Z7982 Long term (current) use of aspirin: Secondary | ICD-10-CM | POA: Insufficient documentation

## 2022-08-06 DIAGNOSIS — M79674 Pain in right toe(s): Secondary | ICD-10-CM | POA: Diagnosis present

## 2022-08-06 DIAGNOSIS — M109 Gout, unspecified: Secondary | ICD-10-CM | POA: Diagnosis not present

## 2022-08-06 MED ORDER — PREDNISONE 50 MG PO TABS
60.0000 mg | ORAL_TABLET | Freq: Once | ORAL | Status: AC
  Administered 2022-08-06: 60 mg via ORAL
  Filled 2022-08-06: qty 1

## 2022-08-06 MED ORDER — COLCHICINE 0.6 MG PO TABS: ORAL_TABLET | ORAL | 0 refills | Status: DC

## 2022-08-06 NOTE — Discharge Instructions (Signed)
You may continue taking your pain medication as directed.  Start the colchicine today.  Do not take Advil, ibuprofen, Naprosyn or Aleve while taking the colchicine.  It may cause some stomach upset.  Only take the medication as directed.  You have been given follow-up information so that you may establish primary care.

## 2022-08-06 NOTE — ED Triage Notes (Signed)
Pt c/o right great toe pain x 1 week.

## 2022-08-10 NOTE — ED Provider Notes (Signed)
[place] EMERGENCY DEPARTMENT AT [facility] Provider Note   CSN: [id number] Arrival date & time: 08/06/22  9629     History  Chief Complaint  Patient presents with   Toe Pain    Joseph Pruitt is a 57 y.o. male.   Toe Pain Pertinent negatives include no chest pain and no abdominal pain.       Joseph Pruitt is a 57 y.o. male who presents to the Emergency Department complaining of right great toe pain, gradually worsening for 1 week.  Patient denies known history of gout believes that he may have injured his toe but is unsure.  Thinks he may have dropped something on his toe.  Pain is worse with minimal touch and with ambulation.  He denies any numbness or pain to the remaining foot or toes.  Home Medications Prior to Admission medications   Medication Sig Start Date End Date Taking? Authorizing Provider  colchicine 0.6 MG tablet Take two tablets for first dose, then one tablet one hour later. You may repeat this dosing after 3 days if needed 08/06/22  Yes Elise Knobloch, PA-C  aspirin 81 MG EC tablet Take 1 tablet by mouth daily. 06/10/16   [provider]  cyclobenzaprine (FLEXERIL) 10 MG tablet Take 1 tablet (10 mg total) by mouth 2 (two) times daily as needed for muscle spasms. Patient not taking: Reported on 01/16/2022 05/21/21   Carroll Sage, PA-C  fluticasone [facility]) 50 MCG/ACT nasal spray Place 1 spray into both nostrils daily. 10/05/14 05/01/21  [provider]  fluticasone (FLONASE) 50 MCG/ACT nasal spray Place 1 spray into both nostrils daily. Patient not taking: Reported on 01/16/2022 06/29/21   Horton, Mayer Masker, MD  hydrochlorothiazide (HYDRODIURIL) 25 MG tablet Take 25 mg by mouth daily.    [provider]  hydrOXYzine (ATARAX) 25 MG tablet Take 1 tablet (25 mg total) by mouth every 6 (six) hours as needed for itching. Patient not taking: Reported on 01/16/2022 09/06/21   Bethann Berkshire, MD  ibuprofen (ADVIL) 800 MG tablet  Take 1 tablet (800 mg total) by mouth every 8 (eight) hours as needed for moderate pain. Patient not taking: Reported on 06/16/2022 09/10/19   Bethann Berkshire, MD  loratadine (CLARITIN) 10 MG tablet Take 1 tablet (10 mg total) by mouth daily. Patient not taking: Reported on 01/16/2022 06/29/21   Horton, Mayer Masker, MD  metFORMIN (GLUCOPHAGE) 500 MG tablet Take 1 tablet (500 mg total) by mouth 2 (two) times daily with a meal. 06/16/22   Bethann Berkshire, MD  naproxen (NAPROSYN) 375 MG tablet Take 1 tablet (375 mg total) by mouth 2 (two) times daily. Patient not taking: Reported on 01/16/2022 07/11/20   Horton, Clabe Seal, DO  oxyCODONE-acetaminophen (PERCOCET/ROXICET) 5-325 MG tablet Take 1 tablet by mouth every 6 (six) hours as needed for moderate pain or severe pain.  11/22/14   [provider]      Allergies    Patient has no known allergies.    Review of Systems   Review of Systems  Constitutional:  Negative for appetite change, chills and fever.  Cardiovascular:  Negative for chest pain.  Gastrointestinal:  Positive for diarrhea. Negative for abdominal pain, nausea and vomiting.  Genitourinary:  Negative for dysuria.  Musculoskeletal:  Positive for arthralgias (Right great toe pain).  Skin:  Negative for color change and wound.  Neurological:  Negative for dizziness, weakness and numbness.    Physical Exam Updated Vital Signs BP 139/74 (BP  Location: Right Arm)   Pulse 80   Temp 97.9 F (36.6 C) (Oral)   Resp 18   Ht 6\' 1"  (1.854 m)   Wt 112.9 kg   SpO2 99%   BMI 32.85 kg/m  Physical Exam Vitals and nursing note reviewed.  Constitutional:      General: He is not in acute distress.    Appearance: Normal appearance. He is not ill-appearing.  Cardiovascular:     Rate and Rhythm: Normal rate and regular rhythm.     Pulses: Normal pulses.  Pulmonary:     Effort: Pulmonary effort is normal.  Musculoskeletal:        General: Swelling and tenderness present. No deformity.      Right lower leg: No edema.     Left lower leg: No edema.     Comments: Tender to palpation to the base of the right first MTP joint.  Noted.  No significant erythema or lymphangitis.  Remaining toes are nontender.  No tenderness of the right ankle.  No pain or swelling surrounding the nail  Skin:    Capillary Refill: Capillary refill takes less than 2 seconds.     Findings: No bruising, erythema or rash.  Neurological:     General: No focal deficit present.     Mental Status: He is alert.     Sensory: No sensory deficit.     Motor: No weakness.     ED Results / Procedures / Treatments   Labs (all labs ordered are listed, but only abnormal results are displayed) Labs Reviewed - No data to display  EKG None  Radiology No results found.  Procedures Procedures    Medications Ordered in ED Medications  predniSONE (DELTASONE) tablet 60 mg (60 mg Oral Given 08/06/22 1116)    ED Course/ Medical Decision Making/ A&P                             Medical Decision Making Patient here with 1 week history of right great toe pain.  Pain is localized at the first MTP joint.  Some mild swelling noted as well.   Differential includes but not limited to injury, gout ingrown toenail, fracture, dislocation  Clinically, I do not see any evidence of a ingrown nail or nail injury.  He is not tender at the distal toe.  I suspect this is related to gout but patient does endorse possible injury, so will x-ray  Amount and/or Complexity of Data Reviewed Radiology: ordered.    Details: X-ray without acute bony finding Discussion of management or test interpretation with external provider(s): Patient with likely gout.  Agreeable to treatment plan with colchicine, has pain medication at home.  He appears appropriate for discharge home, will follow-up outpatient  Risk Prescription drug management.           Final Clinical Impression(s) / ED Diagnoses Final diagnoses:  Pain of toe of  right foot  Acute gout involving toe of right foot, unspecified cause    Rx / DC Orders ED Discharge Orders          Ordered    colchicine 0.6 MG tablet        08/06/22 1120              Pauline Aus, PA-C 08/10/22 1426    Bethann Berkshire, MD 08/14/22 1047

## 2022-08-31 ENCOUNTER — Emergency Department (HOSPITAL_COMMUNITY)
Admission: EM | Admit: 2022-08-31 | Discharge: 2022-08-31 | Disposition: A | Discharge status: Home / Self Care | Payer: Medicare Other | Attending: Emergency Medicine | Admitting: Emergency Medicine

## 2022-08-31 ENCOUNTER — Encounter (HOSPITAL_COMMUNITY): Payer: Self-pay | Admitting: Emergency Medicine

## 2022-08-31 ENCOUNTER — Other Ambulatory Visit: Payer: Self-pay

## 2022-08-31 DIAGNOSIS — R0789 Other chest pain: Secondary | ICD-10-CM | POA: Diagnosis not present

## 2022-08-31 DIAGNOSIS — M25561 Pain in right knee: Secondary | ICD-10-CM | POA: Diagnosis not present

## 2022-08-31 DIAGNOSIS — M79674 Pain in right toe(s): Secondary | ICD-10-CM | POA: Diagnosis present

## 2022-08-31 DIAGNOSIS — M10071 Idiopathic gout, right ankle and foot: Secondary | ICD-10-CM | POA: Diagnosis not present

## 2022-08-31 DIAGNOSIS — Z7982 Long term (current) use of aspirin: Secondary | ICD-10-CM | POA: Insufficient documentation

## 2022-08-31 LAB — CBC WITH DIFFERENTIAL/PLATELET
Abs Immature Granulocytes: 0.01 10*3/uL (ref 0.00–0.07)
Basophils Absolute: 0 10*3/uL (ref 0.0–0.1)
Basophils Relative: 0 %
Eosinophils Absolute: 0.1 10*3/uL (ref 0.0–0.5)
Eosinophils Relative: 3 %
HCT: 40.8 % (ref 39.0–52.0)
Hemoglobin: 13.5 g/dL (ref 13.0–17.0)
Immature Granulocytes: 0 %
Lymphocytes Relative: 52 %
Lymphs Abs: 1.8 10*3/uL (ref 0.7–4.0)
MCH: 30.2 pg (ref 26.0–34.0)
MCHC: 33.1 g/dL (ref 30.0–36.0)
MCV: 91.3 fL (ref 80.0–100.0)
Monocytes Absolute: 0.4 10*3/uL (ref 0.1–1.0)
Monocytes Relative: 10 %
Neutro Abs: 1.2 10*3/uL — ABNORMAL LOW (ref 1.7–7.7)
Neutrophils Relative %: 35 %
Platelets: 200 10*3/uL (ref 150–400)
RBC: 4.47 MIL/uL (ref 4.22–5.81)
RDW: 12.4 % (ref 11.5–15.5)
WBC: 3.5 10*3/uL — ABNORMAL LOW (ref 4.0–10.5)
nRBC: 0 % (ref 0.0–0.2)

## 2022-08-31 LAB — COMPREHENSIVE METABOLIC PANEL
ALT: 16 U/L (ref 0–44)
AST: 19 U/L (ref 15–41)
Albumin: 3.3 g/dL — ABNORMAL LOW (ref 3.5–5.0)
Alkaline Phosphatase: 83 U/L (ref 38–126)
Anion gap: 9 (ref 5–15)
BUN: 13 mg/dL (ref 6–20)
CO2: 27 mmol/L (ref 22–32)
Calcium: 8.7 mg/dL — ABNORMAL LOW (ref 8.9–10.3)
Chloride: 101 mmol/L (ref 98–111)
Creatinine, Ser: 0.98 mg/dL (ref 0.61–1.24)
GFR, Estimated: >60 mL/min (ref >60)
Glucose, Bld: 127 mg/dL — ABNORMAL HIGH (ref 70–99)
Potassium: 3.8 mmol/L (ref 3.5–5.1)
Sodium: 137 mmol/L (ref 135–145)
Total Bilirubin: 0.8 mg/dL (ref 0.3–1.2)
Total Protein: 7.3 g/dL (ref 6.5–8.1)

## 2022-08-31 LAB — TROPONIN I (HIGH SENSITIVITY): Troponin I (High Sensitivity): 2 ng/L (ref <18)

## 2022-08-31 LAB — URIC ACID: Uric Acid, Serum: 8.9 mg/dL — ABNORMAL HIGH (ref 3.7–8.6)

## 2022-08-31 MED ORDER — COLCHICINE 0.6 MG PO TABS: ORAL_TABLET | ORAL | 0 refills | Status: AC

## 2022-08-31 MED ORDER — COLCHICINE 0.6 MG PO TABS
0.6000 mg | ORAL_TABLET | Freq: Once | ORAL | Status: AC
  Administered 2022-08-31: 0.6 mg via ORAL
  Filled 2022-08-31: qty 1

## 2022-08-31 MED ORDER — COLCHICINE 0.6 MG PO TABS
1.2000 mg | ORAL_TABLET | Freq: Once | ORAL | Status: AC
  Administered 2022-08-31: 1.2 mg via ORAL
  Filled 2022-08-31: qty 2

## 2022-08-31 NOTE — ED Triage Notes (Signed)
Pt c/o R Knee and foot pain/ swelling x 3 days ago. Pt was seen at the end of may for similar symptoms and was given Colchicine to take. Pt states it got better while taking antibiotic, rates pain 8/10

## 2022-08-31 NOTE — ED Provider Notes (Signed)
EMERGENCY DEPARTMENT AT [facility] Provider Note   CSN: [id number] Arrival date & time: 08/31/22  9323     History  Chief Complaint  Patient presents with   Leg Pain    Joseph Pruitt is a 57 y.o. male.  HPI Patient presents with his wife with cysts with history.  He presents with concern of pain in the right great toe, right knee.  Onset was maybe 2 days ago, possibly 3 no obvious precipitant.  He notes the pain is similar to that experienced a month ago when he was diagnosed with gout.  He ran out of his medication he was taking at home. No fevers, chills, loss of sensation or weakness.  He did have an episode of chest pain yesterday, sternal, radiating to left arm, brief, occurred in the course of a disagreement.  He has been taking his other medication as directed.     Home Medications Prior to Admission medications   Medication Sig Start Date End Date Taking? Authorizing Provider  aspirin 81 MG EC tablet Take 1 tablet by mouth daily. 06/10/16   [provider]  colchicine 0.6 MG tablet Take two tablets for first dose, then one tablet one hour later. You may repeat this dosing after 3 days if needed 08/31/22   Gerhard Munch, MD  cyclobenzaprine (FLEXERIL) 10 MG tablet Take 1 tablet (10 mg total) by mouth 2 (two) times daily as needed for muscle spasms. Patient not taking: Reported on 01/16/2022 05/21/21   Carroll Sage, PA-C  fluticasone [facility]) 50 MCG/ACT nasal spray Place 1 spray into both nostrils daily. 10/05/14 05/01/21  [provider]  fluticasone (FLONASE) 50 MCG/ACT nasal spray Place 1 spray into both nostrils daily. Patient not taking: Reported on 01/16/2022 06/29/21   Horton, Mayer Masker, MD  hydrochlorothiazide (HYDRODIURIL) 25 MG tablet Take 25 mg by mouth daily.    [provider]  hydrOXYzine (ATARAX) 25 MG tablet Take 1 tablet (25 mg total) by mouth every 6 (six) hours as needed for itching. Patient not  taking: Reported on 01/16/2022 09/06/21   Bethann Berkshire, MD  ibuprofen (ADVIL) 800 MG tablet Take 1 tablet (800 mg total) by mouth every 8 (eight) hours as needed for moderate pain. Patient not taking: Reported on 06/16/2022 09/10/19   Bethann Berkshire, MD  loratadine (CLARITIN) 10 MG tablet Take 1 tablet (10 mg total) by mouth daily. Patient not taking: Reported on 01/16/2022 06/29/21   Horton, Mayer Masker, MD  metFORMIN (GLUCOPHAGE) 500 MG tablet Take 1 tablet (500 mg total) by mouth 2 (two) times daily with a meal. 06/16/22   Bethann Berkshire, MD  naproxen (NAPROSYN) 375 MG tablet Take 1 tablet (375 mg total) by mouth 2 (two) times daily. Patient not taking: Reported on 01/16/2022 07/11/20   Horton, Clabe Seal, DO  oxyCODONE-acetaminophen (PERCOCET/ROXICET) 5-325 MG tablet Take 1 tablet by mouth every 6 (six) hours as needed for moderate pain or severe pain.  11/22/14   [provider]      Allergies    Patient has no known allergies.    Review of Systems   Review of Systems  All other systems reviewed and are negative.   Physical Exam Updated Vital Signs BP (!) 143/92   Pulse 65   Temp 98.1 F (36.7 C) (Oral)   Resp 19   Ht 6\' 1"  (1.854 m)   Wt 117 kg   SpO2 100%   BMI 34.04 kg/m  Physical Exam Vitals  and nursing note reviewed.  Constitutional:      General: He is not in acute distress.    Appearance: He is well-developed.  HENT:     Head: Normocephalic and atraumatic.  Eyes:     Conjunctiva/sclera: Conjunctivae normal.  Cardiovascular:     Rate and Rhythm: Normal rate and regular rhythm.  Pulmonary:     Effort: Pulmonary effort is normal. No respiratory distress.     Breath sounds: No stridor.  Abdominal:     General: There is no distension.  Musculoskeletal:       Legs:  Skin:    General: Skin is warm and dry.  Neurological:     Mental Status: He is alert and oriented to person, place, and time.     ED Results / Procedures / Treatments   Labs (all labs ordered  are listed, but only abnormal results are displayed) Labs Reviewed  COMPREHENSIVE METABOLIC PANEL - Abnormal; Notable for the following components:      Result Value   Glucose, Bld 127 (*)    Calcium 8.7 (*)    Albumin 3.3 (*)    All other components within normal limits  URIC ACID - Abnormal; Notable for the following components:   Uric Acid, Serum 8.9 (*)    All other components within normal limits  CBC WITH DIFFERENTIAL/PLATELET - Abnormal; Notable for the following components:   WBC 3.5 (*)    Neutro Abs 1.2 (*)    All other components within normal limits  TROPONIN I (HIGH SENSITIVITY)    EKG EKG Interpretation  Date/Time:  Sunday August 31 2022 08:56:53 EDT Ventricular Rate:  61 PR Interval:  193 QRS Duration: 95 QT Interval:  473 QTC Calculation: 477 R Axis:   -30 Text Interpretation: Sinus rhythm Left axis deviation Abnormal R-wave progression, late transition Borderline T abnormalities, anterior leads Borderline prolonged QT interval Confirmed by Gerhard Munch [phone]) on 08/31/2022 9:08:14 AM  Radiology No results found.  Procedures Procedures    Medications Ordered in ED Medications  colchicine tablet 0.6 mg (has no administration in time range)  colchicine tablet 1.2 mg (1.2 mg Oral Given 08/31/22 0855)    ED Course/ Medical Decision Making/ A&P                             Medical Decision Making Adult male with history of prior musculoskeletal pain presents with worsening right foot pain, knee pain, polyarthritis versus gout, less likely septic given the absence of fever, distress.  Patient also had an episode of chest pain, thus, labs in addition to medications ordered. Cardiac 65 sinus normal Pulse ox 100% room air normal   Amount and/or Complexity of Data Reviewed Independent Historian: spouse External Data Reviewed: notes.    Details: Eval from last month with colchicine relieving symptoms noted Labs: ordered. Decision-making details documented  in ED Course. ECG/medicine tests: ordered and independent interpretation performed. Decision-making details documented in ED Course.  Risk Prescription drug management. Decision regarding hospitalization.   9:49 AM Patient in no distress, awake, alert, feeling better. Labs, presentation consistent with gout exacerbation.  We discussed the importance of taking his newly prescribed medication regimen, following up with his primary care physician, he and his wife are both amenable. Given his episode of chest pain yesterday troponin was performed and this was unremarkable, as was his ECG, little evidence for recent MI.        Final Clinical Impression(s) /  ED Diagnoses Final diagnoses:  Acute idiopathic gout of right foot  Atypical chest pain    Rx / DC Orders ED Discharge Orders          Ordered    colchicine 0.6 MG tablet        08/31/22 0949              Gerhard Munch, MD 08/31/22 [phone]

## 2022-08-31 NOTE — Discharge Instructions (Signed)
With your recurrence of gout, as well as yesterday's episode of chest pain and is very importantly follow-up with your physician.  Please call tomorrow for appropriate ongoing outpatient care.  Return here for concerning changes in your condition.

## 2022-12-15 IMAGING — DX DG CHEST 1V PORT
1 series · 1 of 1 positions shown · non-contrast
Comparison: 01/23/2021

CLINICAL DATA: Left-sided chest pain for 2 days.

EXAM:
PORTABLE CHEST 1 VIEW

[chest ap]
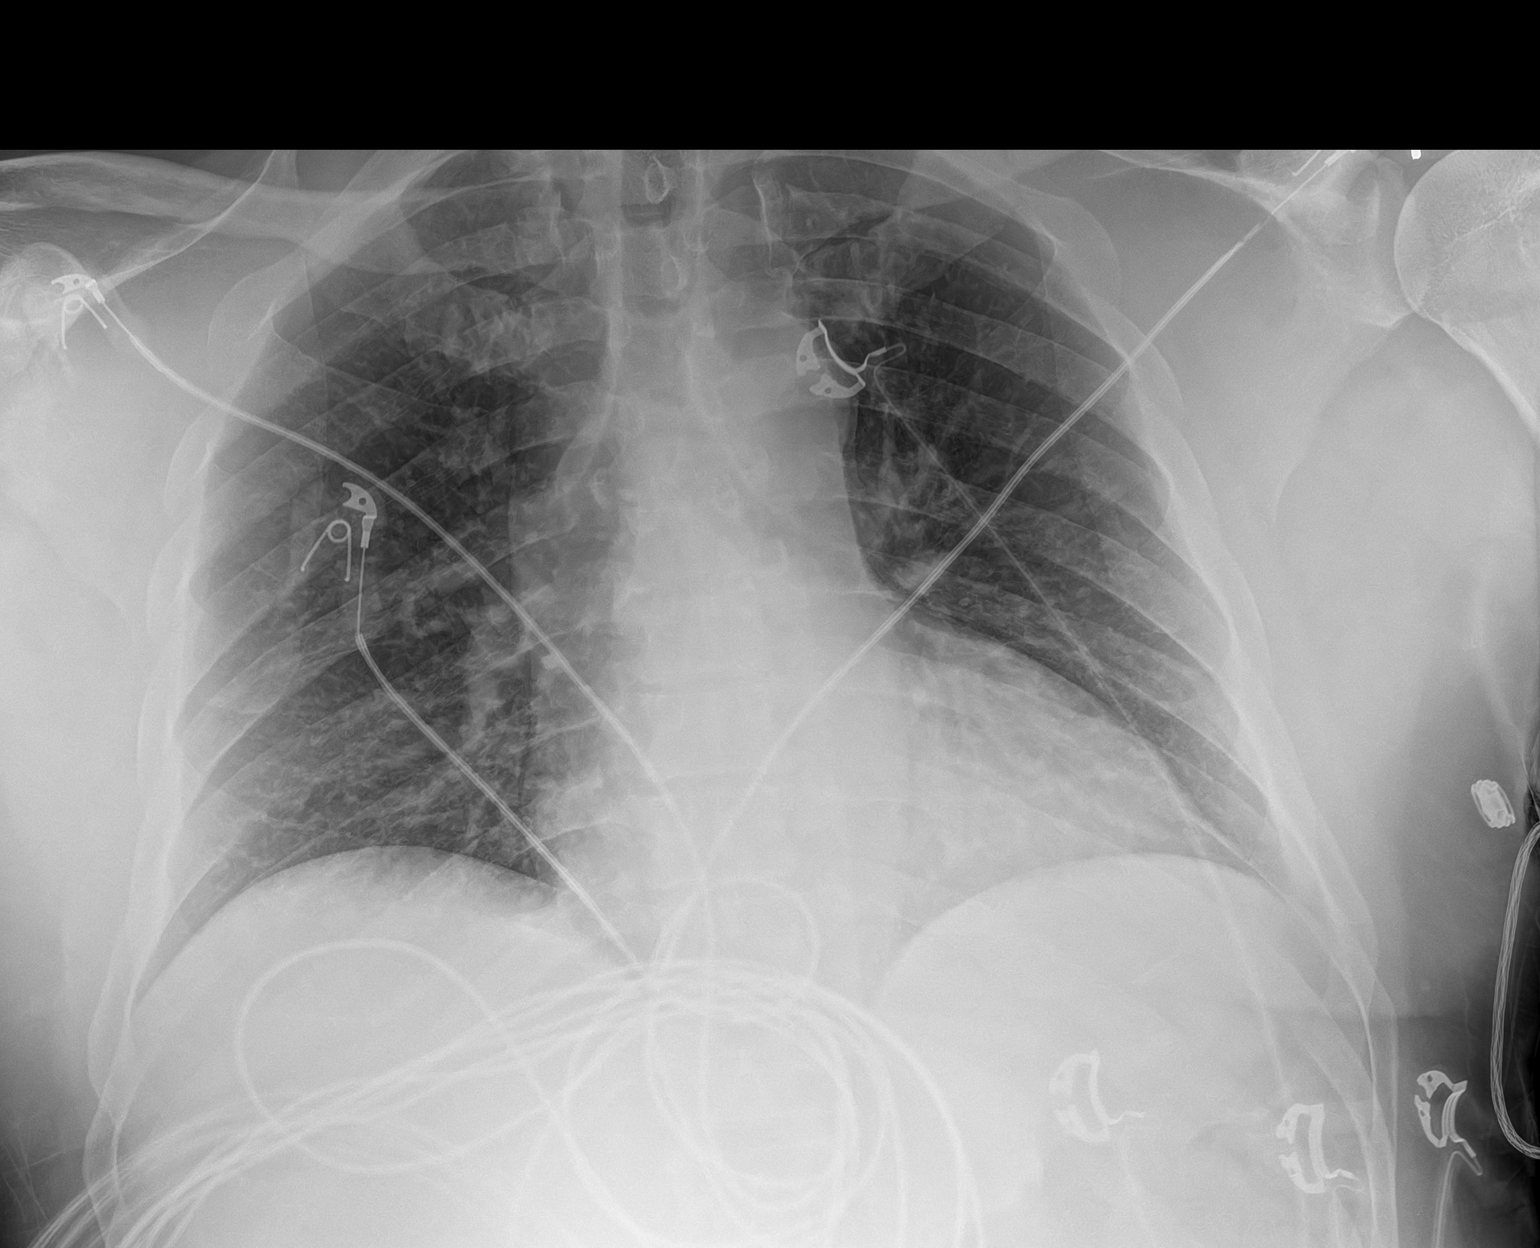

[1 of 1 positions shown; findings below may reference images not displayed]

FINDINGS: 0380 hours. Low volume film. Cardiopericardial silhouette is at
upper limits of normal for size. There is pulmonary vascular
congestion without overt pulmonary edema. The visualized bony
structures of the thorax show no acute abnormality. Telemetry leads
overlie the chest.
IMPRESSION: Low volume film with vascular congestion.

## 2022-12-15 IMAGING — CT CT ANGIO CHEST
2 of 6 series · 19 of 46 positions shown · IV contrast (Omnipaque or Isovue)
Comparison: None.

CLINICAL DATA: Left-sided chest pain for 2 days.

EXAM:
CT ANGIOGRAPHY CHEST WITH CONTRAST
TECHNIQUE: Multidetector CT imaging of the chest was performed using the
standard protocol during bolus administration of intravenous
contrast. Multiplanar CT image reconstructions and MIPs were
obtained to evaluate the vascular anatomy.

[Series 5: pe axial thins · axial · 0.74mm/px · z∈[+1019,+1301]mm · 16 of 384 slices shown]
[im 16/384  lung]
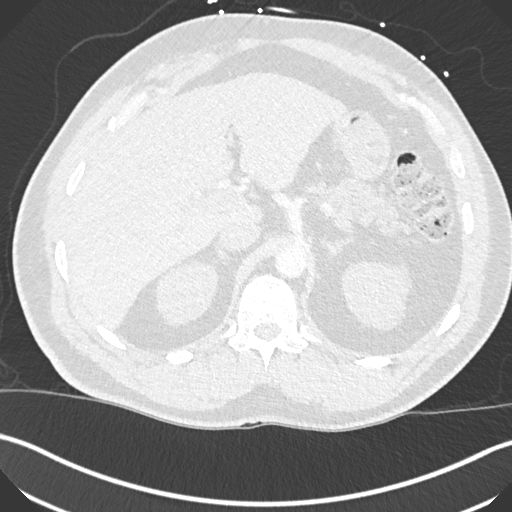
[im 48/384  soft-tissue]
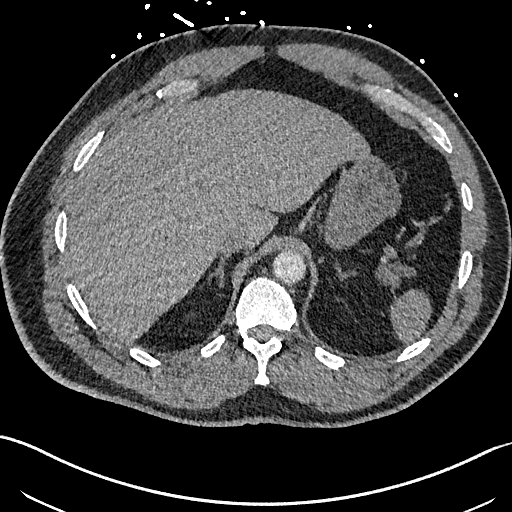
[im 64/384  lung]
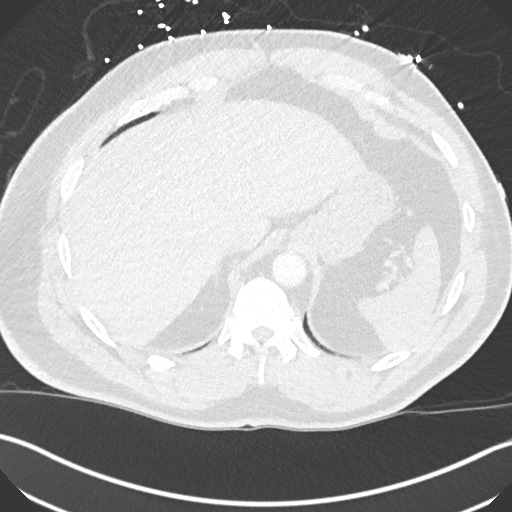
[im 96/384  soft-tissue]
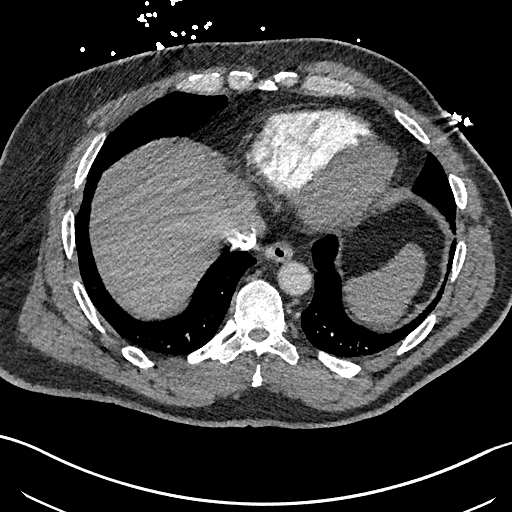
[im 112/384  lung]
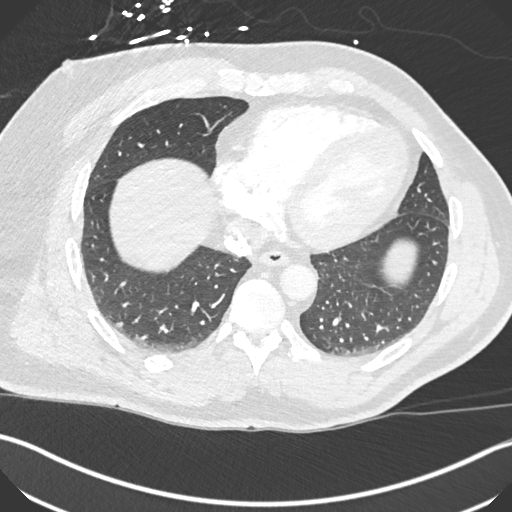
[im 128/384  soft-tissue]
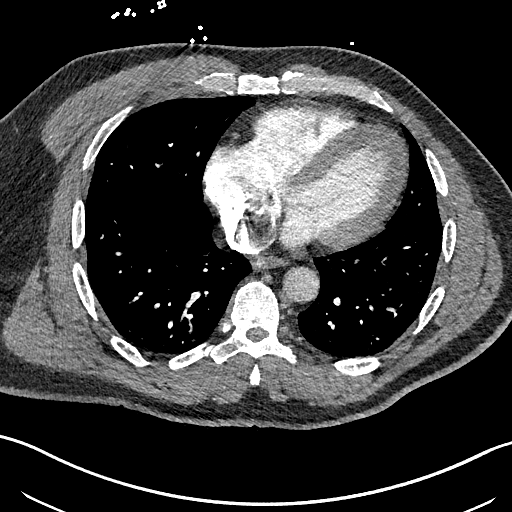
[im 160/384  lung]
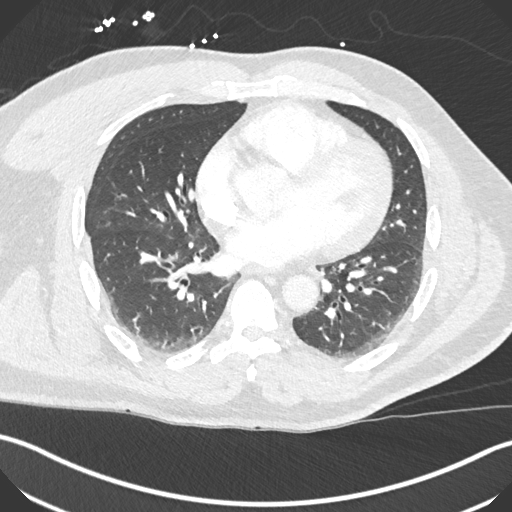
[im 176/384  soft-tissue]
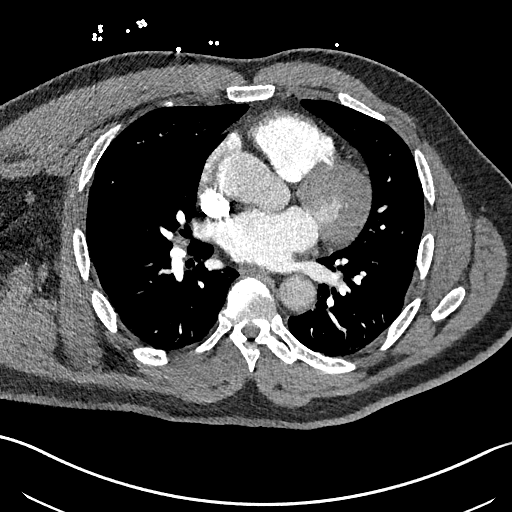
[im 208/384  lung]
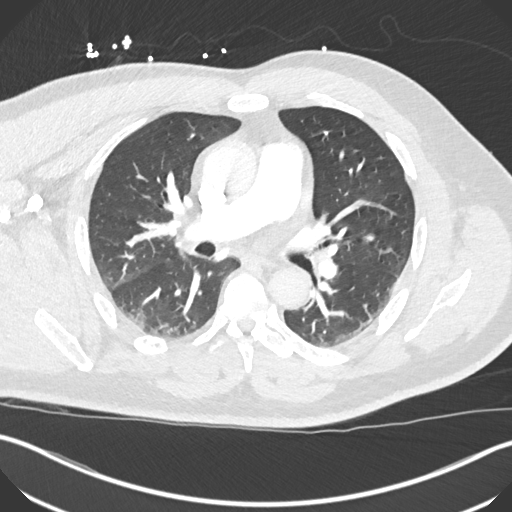
[im 224/384  soft-tissue]
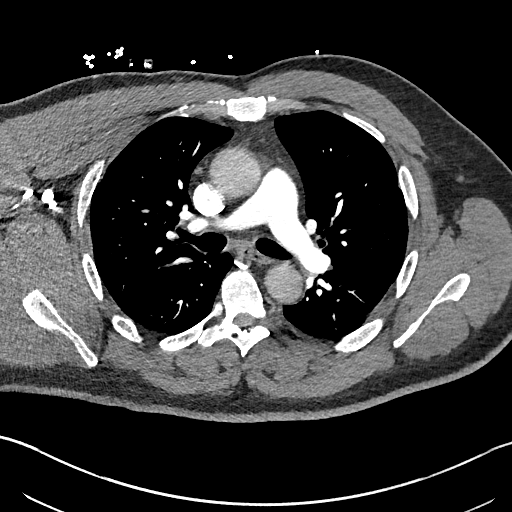
[im 256/384  lung]
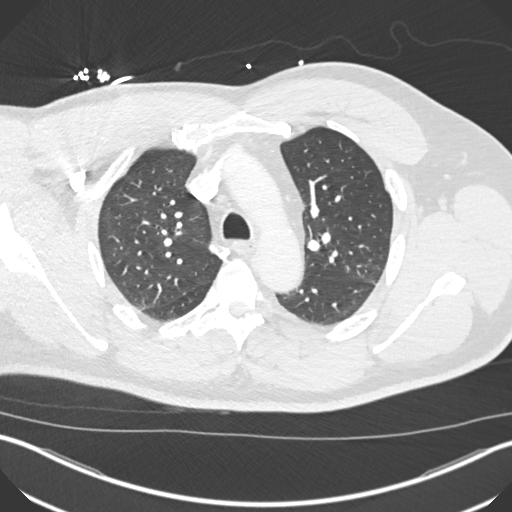
[im 272/384  soft-tissue]
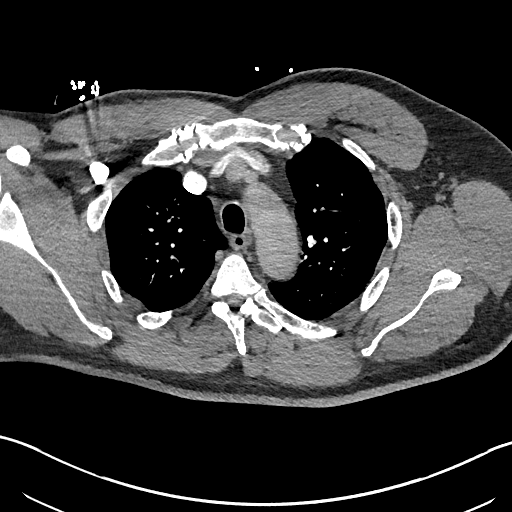
[im 288/384  lung]
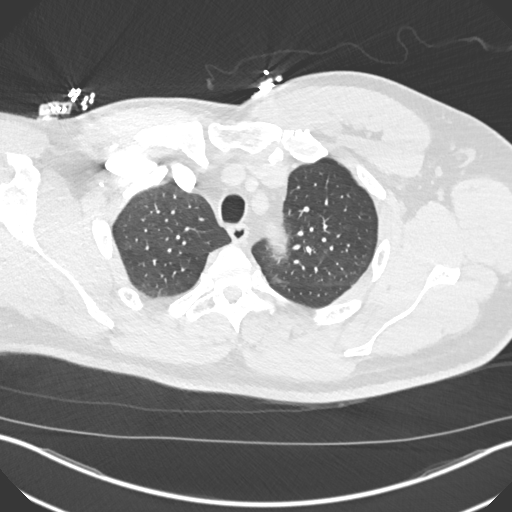
[im 320/384  soft-tissue]
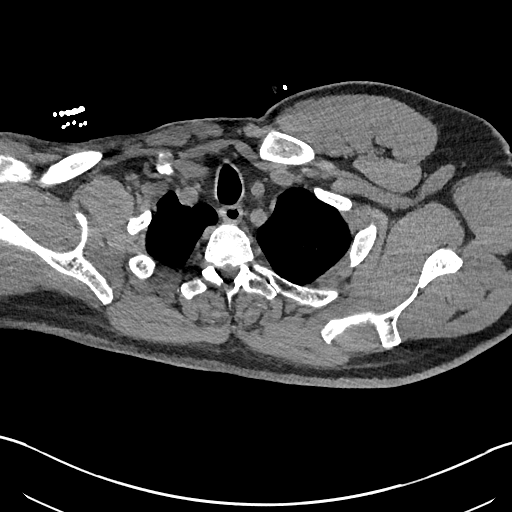
[im 336/384  lung]
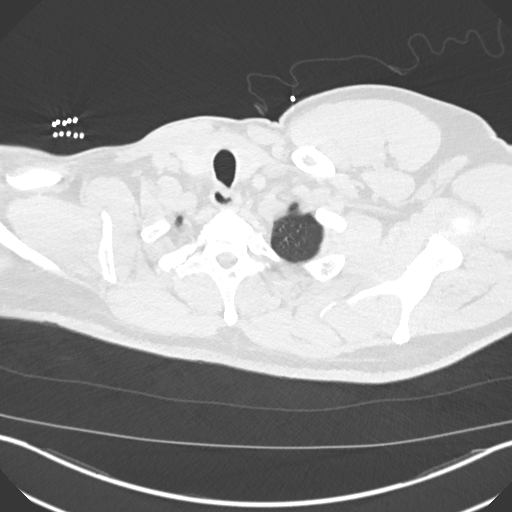
[im 368/384  soft-tissue]
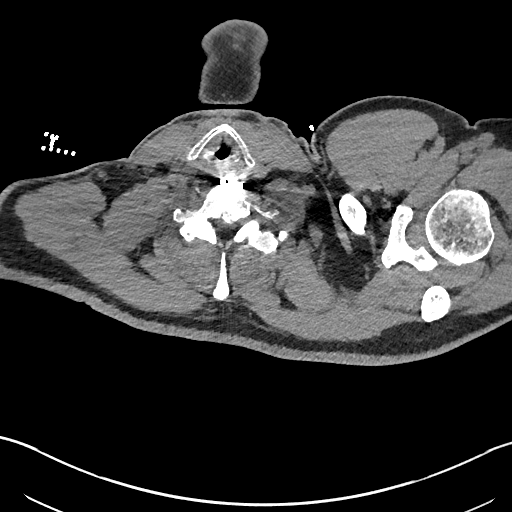

[Series 8: cor soft · coronal · 0.71mm/px · 3 of 176 slices shown]
[im 44/176  soft-tissue]
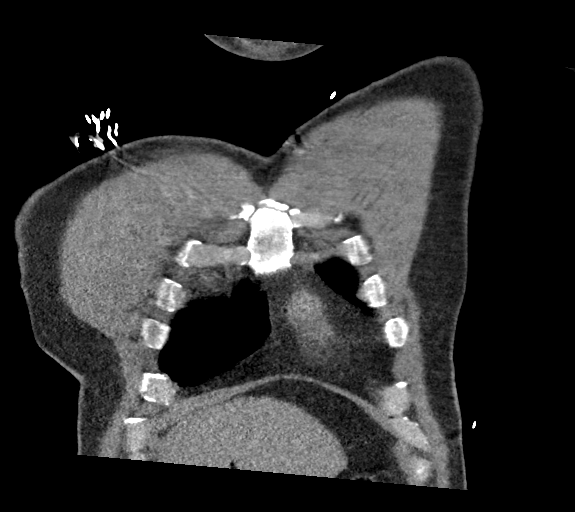
[im 88/176  soft-tissue]
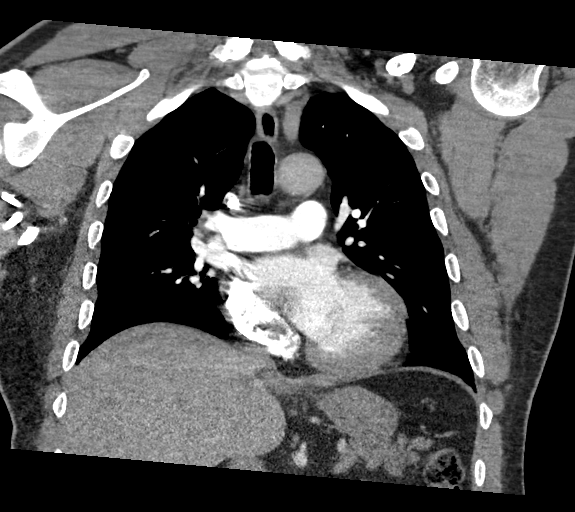
[im 132/176  soft-tissue]
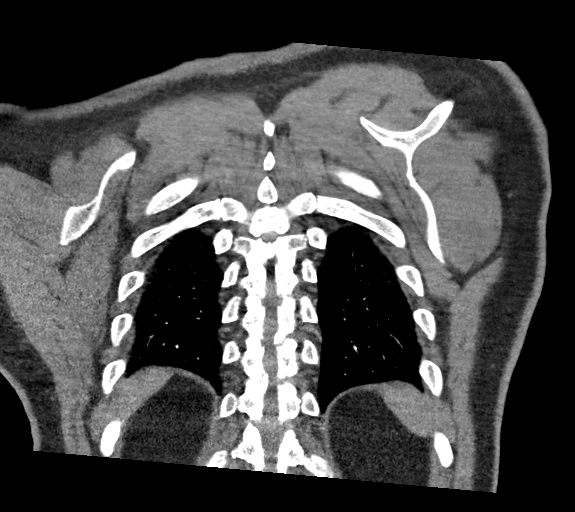

[19 of 46 positions shown; findings below may reference images not displayed]

RADIATION DOSE REDUCTION: This exam was performed according to the
departmental dose-optimization program which includes automated
exposure control, adjustment of the mA and/or kV according to
patient size and/or use of iterative reconstruction technique.

CONTRAST:  75mL OMNIPAQUE IOHEXOL 350 MG/ML SOLN
FINDINGS: Cardiovascular: The heart size is normal. No substantial pericardial
effusion. Mild atherosclerotic calcification is noted in the wall of
the thoracic aorta. There is no filling defect within the opacified
pulmonary arteries to suggest the presence of an acute pulmonary
embolus.

Mediastinum/Nodes: No mediastinal lymphadenopathy. There is no hilar
lymphadenopathy. The esophagus has normal imaging features. There is
no axillary lymphadenopathy.

Lungs/Pleura: No suspicious pulmonary nodule or mass. No focal
airspace consolidation. No pleural effusion.

Upper Abdomen: Unremarkable.

Musculoskeletal: No worrisome lytic or sclerotic osseous
abnormality.

Review of the MIP images confirms the above findings.
IMPRESSION: 1. No CT evidence for acute pulmonary embolus.
2. No acute findings in the chest.
3. Aortic Atherosclerosis (ITVD4-WO6.6).

## 2022-12-18 ENCOUNTER — Emergency Department (HOSPITAL_COMMUNITY)
Admission: EM | Admit: 2022-12-18 | Discharge: 2022-12-18 | Disposition: A | Discharge status: Home / Self Care | Payer: Medicare Other | Attending: Student | Admitting: Student

## 2022-12-18 ENCOUNTER — Emergency Department (HOSPITAL_COMMUNITY): Payer: Medicare Other

## 2022-12-18 ENCOUNTER — Other Ambulatory Visit: Payer: Self-pay

## 2022-12-18 ENCOUNTER — Encounter (HOSPITAL_COMMUNITY): Payer: Self-pay

## 2022-12-18 DIAGNOSIS — M25572 Pain in left ankle and joints of left foot: Secondary | ICD-10-CM | POA: Diagnosis present

## 2022-12-18 DIAGNOSIS — Z7982 Long term (current) use of aspirin: Secondary | ICD-10-CM | POA: Insufficient documentation

## 2022-12-18 DIAGNOSIS — X501XXA Overexertion from prolonged static or awkward postures, initial encounter: Secondary | ICD-10-CM | POA: Diagnosis not present

## 2022-12-18 MED ORDER — NAPROXEN 375 MG PO TABS: 375.0000 mg | ORAL_TABLET | Freq: Two times a day (BID) | ORAL | 0 refills | Status: DC

## 2022-12-18 MED ORDER — KETOROLAC TROMETHAMINE 10 MG PO TABS
10.0000 mg | ORAL_TABLET | Freq: Once | ORAL | Status: AC
  Administered 2022-12-18: 10 mg via ORAL
  Filled 2022-12-18: qty 1

## 2022-12-18 NOTE — ED Provider Notes (Addendum)
[place] EMERGENCY DEPARTMENT AT [facility] Provider Note   CSN: [id number] Arrival date & time: 12/18/22  7829     History  Chief Complaint  Patient presents with   Leg Pain    Joseph Pruitt is a 57 y.o. male.  Negative pain to his "Achilles".  States last week he was on his roof repairing and felt a pop in the left ankle and states it has been swollen since then.  He tried some topical medication and Percocet he had leftover without relief.  She denies fever or chills.  States he has pain in the area where he had his incision when he had Achilles tendon repair many years ago.  He has been able to ambulate.   Leg Pain      Home Medications Prior to Admission medications   Medication Sig Start Date End Date Taking? Authorizing Provider  aspirin 81 MG EC tablet Take 1 tablet by mouth daily. 06/10/16   [provider]  colchicine 0.6 MG tablet Take two tablets for first dose, then one tablet one hour later. You may repeat this dosing after 3 days if needed 08/31/22   Gerhard Munch, MD  cyclobenzaprine (FLEXERIL) 10 MG tablet Take 1 tablet (10 mg total) by mouth 2 (two) times daily as needed for muscle spasms. Patient not taking: Reported on 01/16/2022 05/21/21   Carroll Sage, PA-C  fluticasone [facility]) 50 MCG/ACT nasal spray Place 1 spray into both nostrils daily. 10/05/14 05/01/21  [provider]  fluticasone (FLONASE) 50 MCG/ACT nasal spray Place 1 spray into both nostrils daily. Patient not taking: Reported on 01/16/2022 06/29/21   Horton, Mayer Masker, MD  hydrochlorothiazide (HYDRODIURIL) 25 MG tablet Take 25 mg by mouth daily.    [provider]  hydrOXYzine (ATARAX) 25 MG tablet Take 1 tablet (25 mg total) by mouth every 6 (six) hours as needed for itching. Patient not taking: Reported on 01/16/2022 09/06/21   Bethann Berkshire, MD  ibuprofen (ADVIL) 800 MG tablet Take 1 tablet (800 mg total) by mouth every 8 (eight) hours as needed for  moderate pain. Patient not taking: Reported on 06/16/2022 09/10/19   Bethann Berkshire, MD  loratadine (CLARITIN) 10 MG tablet Take 1 tablet (10 mg total) by mouth daily. Patient not taking: Reported on 01/16/2022 06/29/21   Horton, Mayer Masker, MD  metFORMIN (GLUCOPHAGE) 500 MG tablet Take 1 tablet (500 mg total) by mouth 2 (two) times daily with a meal. 06/16/22   Bethann Berkshire, MD  naproxen (NAPROSYN) 375 MG tablet Take 1 tablet (375 mg total) by mouth 2 (two) times daily. 12/18/22   Carmel Sacramento A, PA-C  oxyCODONE-acetaminophen (PERCOCET/ROXICET) 5-325 MG tablet Take 1 tablet by mouth every 6 (six) hours as needed for moderate pain or severe pain.  11/22/14   [provider]      Allergies    Patient has no known allergies.    Review of Systems   Review of Systems  Physical Exam Updated Vital Signs BP (!) 142/78   Pulse 72   Temp 98.6 F (37 C) (Oral)   Resp 20   Ht 6\' 1"  (1.854 m)   Wt 108.9 kg   SpO2 98%   BMI 31.66 kg/m  Physical Exam Vitals and nursing note reviewed.  Constitutional:      General: He is not in acute distress.    Appearance: He is well-developed.  HENT:     Head: Normocephalic and atraumatic.  Eyes:  Conjunctiva/sclera: Conjunctivae normal.  Cardiovascular:     Rate and Rhythm: Normal rate and regular rhythm.     Heart sounds: No murmur heard. Pulmonary:     Effort: Pulmonary effort is normal. No respiratory distress.     Breath sounds: Normal breath sounds.  Abdominal:     Palpations: Abdomen is soft.     Tenderness: There is no abdominal tenderness.  Musculoskeletal:        General: No swelling. Normal range of motion.     Cervical back: Neck supple.     Comments: Swelling and tenderness to posterior lateral left ankle.  Normal DP and PT pulses on the left.  Foot is warm and well-perfused.  Normal plantarflexion of foot with calf squeeze  Skin:    General: Skin is warm and dry.     Capillary Refill: Capillary refill takes less than 2  seconds.  Neurological:     General: No focal deficit present.     Mental Status: He is alert and oriented to person, place, and time.  Psychiatric:        Mood and Affect: Mood normal.     ED Results / Procedures / Treatments   Labs (all labs ordered are listed, but only abnormal results are displayed) Labs Reviewed - No data to display  EKG None  Radiology DG Ankle Complete Left  Result Date: 12/18/2022 CLINICAL DATA:  pain, swelling EXAM: LEFT ANKLE COMPLETE - 3+ VIEW COMPARISON:  None Available. FINDINGS: No acute fracture or dislocation. No aggressive osseous lesion. Broken portion of threaded screw noted in the posterosuperior portion of the calcaneus. Ankle mortise appears intact. Mild degenerative changes of imaged joints. Calcaneal spur noted along the Achilles tendon and Plantar aponeurosis attachment sites. There is asymmetric soft tissue swelling overlying the lateral malleolus without discrete underlying fracture. No radiopaque foreign bodies. IMPRESSION: 1. Lateral soft tissue swelling. No acute fracture or dislocation. Electronically Signed   By: Jules Schick M.D.   On: 12/18/2022 10:51    Procedures .Ortho Injury Treatment  Date/Time: 12/18/2022 11:33 AM  Performed by: Ma Rings, PA-C Authorized by: Ma Rings, PA-C  Injury location: ankle Location details: left ankle Injury type: soft tissue Pre-procedure neurovascular assessment: neurovascularly intact Pre-procedure range of motion: reduced  Anesthesia: Local anesthesia used: no  Patient sedated: NoImmobilization: splint Splint type: Right short leg splint with foot in slight plantarflexion. Splint Applied by: ED Tech Post-procedure neurovascular assessment: post-procedure neurovascularly intact       Medications Ordered in ED Medications  ketorolac (TORADOL) tablet 10 mg (10 mg Oral Given 12/18/22 1045)    ED Course/ Medical Decision Making/ A&P                                  Medical Decision Making This patient presents to the ED for concern of left ankle pain and swellin "Achilles pain", this involves an extensive number of treatment options, and is a complaint that carries with it a high risk of complications and morbidity.  The differential diagnosis includes Achilles tendon rupture versus strain, ankle sprain, fracture, contusion, cellulitis, septic arthritis, other  Problem List / ED Course / Critical interventions / Medication management  Having left ankle pain and swelling after feeling a pop while fixing his roof.  He has some mild Achilles tenderness but normal plantarflexion on calf squeeze, discussed with patient could still be partial tear he also has some tenderness  in the lateral ankle with swelling in this area as well could be simple ankle strain.  Splinted in slight equinus and patient made nonweightbearing and advised on close orthopedic follow-up.  He has no fevers or chills or overlying redness to suggest cellulitis or septic arthritis.  Do not feel he needs further workup emergently but is advised on strict return precautions. I ordered medication including Toradol for pain Reevaluation of the patient after these medicines showed that the patient improved I have reviewed the patients home medicines and have made adjustments as needed       Amount and/or Complexity of Data Reviewed Radiology: ordered.  Risk Prescription drug management.           Final Clinical Impression(s) / ED Diagnoses Final diagnoses:  Acute left ankle pain    Rx / DC Orders ED Discharge Orders          Ordered    naproxen (NAPROSYN) 375 MG tablet  2 times daily        12/18/22 [address], PA-C 12/18/22 1133    [address] PA-C 12/18/22 1135    Glendora Score, MD 12/18/22 2240

## 2022-12-18 NOTE — ED Triage Notes (Signed)
Pt states he was on the roof fixing a leak 3-4 days ago and was stretching his achilles and "may have heard a pop" but its been painful and swollen since. Has taken medication for gout before but not presently. PT diabetic supposed to take metformin but states he only takes sometimes because it "gives him gas" Takes BP meds and ASA 81mg  daily.

## 2022-12-18 NOTE — Discharge Instructions (Addendum)
Was a pleasure taking care of you today.  You are here for an injury to your left ankle and tenderness around your Achilles.  Your x-ray did not show any broken bones.  You have a broken portion of screw in your heel bone from prior surgery.  We have put you in a splint to protect your ankle and Achilles tendon.  Do not put any weight on your ankle and follow-up closely with orthopedics.  Do not get your splint wet.

## 2022-12-24 ENCOUNTER — Encounter: Payer: Self-pay | Admitting: Orthopedic Surgery

## 2022-12-24 ENCOUNTER — Ambulatory Visit (INDEPENDENT_AMBULATORY_CARE_PROVIDER_SITE_OTHER): Payer: Medicare Other | Admitting: Orthopedic Surgery

## 2022-12-24 VITALS — BP 143/96 | HR 82 | Ht 73.0 in | Wt 240.0 lb

## 2022-12-24 DIAGNOSIS — S86002A Unspecified injury of left Achilles tendon, initial encounter: Secondary | ICD-10-CM | POA: Diagnosis not present

## 2022-12-24 MED ORDER — IBUPROFEN 800 MG PO TABS: 800.0000 mg | ORAL_TABLET | Freq: Three times a day (TID) | ORAL | 0 refills | Status: DC | PRN

## 2022-12-24 NOTE — Progress Notes (Signed)
Office Visit Note   Patient: Joseph Pruitt           Date of Birth: 1965-12-24           MRN: [id number] Visit Date: 12/24/2022 Requested by: Zenaida Niece, MD [address] [place],  [place] [address] PCP: Zenaida Niece, MD   Assessment & Plan:   Encounter Diagnosis  Name Primary?   Injury of left Achilles tendon, initial encounter Yes    Meds ordered this encounter  Medications   ibuprofen (ADVIL) 800 MG tablet    Sig: Take 1 tablet (800 mg total) by mouth every 8 (eight) hours as needed for moderate pain (pain score 4-6).    Dispense:  60 tablet    Refill:  78   57 year old male previous Achilles tendon surgery which appears to have involved removal of Haglund's deformity and repair of Achilles tendon presents with posterior ankle pain at the Achilles tendon with intact Thompson test with atraumatic onset of pain  Recommend 3 weeks of immobilization protected weightbearing progressive weightbearing as tolerated recheck if necessary MRI can be ordered if no improvement however Thompson test is normal which would indicate most likely intact tendon   Subjective: Chief Complaint  Patient presents with   Ankle Injury    ER 10/10/ left ankle injury     HPI: 57 year old male had previous Achilles tendon surgery elsewhere.  Appears to have had removal of posterior Haglund's deformity and reattachment of Achilles about 10 years ago was lying in bed exercising his feet went into plantarflexion with the left foot and felt acute pain in the posterior aspect of his heel.  He was evaluated in the emergency room x-rays were taken suture anchor was noted in the Achilles with no change from previous x-ray.  There were a lot of bone spurs fragments and calcification in the area he presents for evaluation and management              ROS: No evidence of chest pain shortness of breath does have a history of neuropathy from cancer treatment   Images personally read and my  interpretation : Imaging was done at the hospital suture anchors in place.  I compared it to another image done 3 years ago it does not appear to have changed position he does have a lot of calcification in this area and recurrence of spur formation  Visit Diagnoses:  1. Injury of left Achilles tendon, initial encounter      Follow-Up Instructions: Return in about 3 weeks (around 01/14/2023) for FOLLOW UP, LEFT-achilles.    Objective: Vital Signs: BP (!) 143/96   Pulse 82   Ht 6\' 1"  (1.854 m)   Wt 240 lb (108.9 kg)   BMI 31.66 kg/m   Physical Exam Vitals and nursing note reviewed.  Constitutional:      Appearance: Normal appearance.  HENT:     Head: Normocephalic and atraumatic.  Eyes:     General: No scleral icterus.       Right eye: No discharge.        Left eye: No discharge.     Extraocular Movements: Extraocular movements intact.     Conjunctiva/sclera: Conjunctivae normal.     Pupils: Pupils are equal, round, and reactive to light.  Cardiovascular:     Rate and Rhythm: Normal rate.     Pulses: Normal pulses.  Skin:    General: Skin is warm and dry.     Capillary Refill: Capillary  refill takes less than 2 seconds.  Neurological:     General: No focal deficit present.     Mental Status: He is alert and oriented to person, place, and time.     Gait: Gait abnormal.  Psychiatric:        Mood and Affect: Mood normal.        Behavior: Behavior normal.        Thought Content: Thought content normal.        Judgment: Judgment normal.      Ortho Exam Thompson test was normal tenderness was noted at the previous surgical site no defect was palpable weakness in plantarflexion was noted (pain versus actual weakness unclear)    Specialty Comments:  No specialty comments available.  Imaging: No results found.   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   Primary osteoarthritis involving multiple joints 01/24/2020   Colon cancer (HCC) 08/29/2016   Controlled substance agreement signed 07/20/2014   Head ache 09/21/2013   Essential hypertension 06/30/2013   High cholesterol 06/30/2013   Pain in joint, ankle and foot 01/20/2013   Left leg weakness 01/20/2013   Achilles tendinosis 12/04/2010   CARPAL TUNNEL SYNDROME, HX OF 02/14/2009   MONONEURITIS OF UNSPECIFIED SITE 11/22/2008   SPONDYLOSIS, CERVICAL, WITH RADICULOPATHY 11/22/2008   TENDINITIS, RIGHT WRIST 11/22/2008   NECK PAIN 08/14/2008   Past Medical History:  Diagnosis Date   Cancer (HCC)    colon cancer   Cancer (HCC)    Environmental allergies    High cholesterol    Hypertension    Stomach ulcer     History reviewed. No pertinent family history.  Past Surgical History:  Procedure Laterality Date   ACHILLES TENDON SURGERY     CERVICAL LAMINECTOMY     COLON RESECTION     HAND SURGERY     SHOULDER ARTHROSCOPY Right    SPINE SURGERY     Social History   Occupational History   Not on file  Tobacco Use   Smoking status: Never   Smokeless tobacco: Never  Vaping Use   Vaping status: Some Days   Substances: Nicotine, Flavoring  Substance and Sexual Activity   Alcohol use: Never   Drug use: Never   Sexual activity: Not on file

## 2023-01-15 ENCOUNTER — Ambulatory Visit: Payer: Medicare Other | Admitting: Orthopedic Surgery

## 2023-01-15 ENCOUNTER — Encounter: Payer: Self-pay | Admitting: Orthopedic Surgery

## 2023-01-15 VITALS — BP 141/90 | Ht 73.0 in | Wt 240.0 lb

## 2023-01-15 DIAGNOSIS — S86002D Unspecified injury of left Achilles tendon, subsequent encounter: Secondary | ICD-10-CM

## 2023-01-15 NOTE — Progress Notes (Signed)
Chief Complaint  Patient presents with   Ankle Pain    Left/ achilles using boot helps has tried to walk without the boot some     Follow up   Encounter Diagnosis  Name Primary?   Injury of left Achilles tendon, subsequent encounter Yes    Achilles pian   Tx  cam walker + IB   Improved but not pain free  No ev of tear; less swellingmotor 5/5 PF   Continue Cam walker with weaning prog   F/u 3-4 weeks

## 2023-01-15 NOTE — Patient Instructions (Signed)
Continue boot for 3 more week s

## 2023-02-13 ENCOUNTER — Ambulatory Visit: Payer: Medicare Other | Admitting: Orthopedic Surgery

## 2023-07-01 ENCOUNTER — Emergency Department (HOSPITAL_COMMUNITY)
Admission: EM | Admit: 2023-07-01 | Discharge: 2023-07-01 | Disposition: A | Discharge status: Home / Self Care | Attending: Emergency Medicine | Admitting: Emergency Medicine

## 2023-07-01 ENCOUNTER — Emergency Department (HOSPITAL_COMMUNITY)

## 2023-07-01 ENCOUNTER — Encounter (HOSPITAL_COMMUNITY): Payer: Self-pay

## 2023-07-01 ENCOUNTER — Other Ambulatory Visit: Payer: Self-pay

## 2023-07-01 DIAGNOSIS — J019 Acute sinusitis, unspecified: Secondary | ICD-10-CM | POA: Diagnosis not present

## 2023-07-01 DIAGNOSIS — Z7982 Long term (current) use of aspirin: Secondary | ICD-10-CM | POA: Insufficient documentation

## 2023-07-01 DIAGNOSIS — M67824 Other specified disorders of tendon, left elbow: Secondary | ICD-10-CM | POA: Insufficient documentation

## 2023-07-01 DIAGNOSIS — M778 Other enthesopathies, not elsewhere classified: Secondary | ICD-10-CM

## 2023-07-01 DIAGNOSIS — R509 Fever, unspecified: Secondary | ICD-10-CM | POA: Diagnosis not present

## 2023-07-01 DIAGNOSIS — M25522 Pain in left elbow: Secondary | ICD-10-CM | POA: Diagnosis present

## 2023-07-01 HISTORY — DX: Type 2 diabetes mellitus without complications: E11.9

## 2023-07-01 LAB — RESP PANEL BY RT-PCR (RSV, FLU A&B, COVID)  RVPGX2
Influenza A by PCR: NEGATIVE
Influenza B by PCR: NEGATIVE
Resp Syncytial Virus by PCR: NEGATIVE
SARS Coronavirus 2 by RT PCR: NEGATIVE

## 2023-07-01 MED ORDER — IBUPROFEN 800 MG PO TABS: 800.0000 mg | ORAL_TABLET | Freq: Three times a day (TID) | ORAL | 0 refills | Status: AC

## 2023-07-01 MED ORDER — CETIRIZINE HCL 10 MG PO TABS: 10.0000 mg | ORAL_TABLET | Freq: Every day | ORAL | 2 refills | Status: AC

## 2023-07-01 MED ORDER — FLUTICASONE PROPIONATE 50 MCG/ACT NA SUSP: 2.0000 | Freq: Every day | NASAL | 2 refills | Status: AC

## 2023-07-01 NOTE — Discharge Instructions (Signed)
 Apply ice packs on and off to your elbow.  Avoid heavy lifting with your left arm.  Take the medication as directed.  You may follow-up with your primary care provider or you may contact the orthopedic provider listed to arrange follow-up appointment in 1 week if your elbow symptoms are not improving.

## 2023-07-01 NOTE — ED Provider Notes (Signed)
 [place] EMERGENCY DEPARTMENT AT [facility] Provider Note   CSN: [id number] Arrival date & time: 07/01/23  1156     History  Chief Complaint  Patient presents with   Arm Pain    Joseph Pruitt is a 58 y.o. male.   Arm Pain Pertinent negatives include no chest pain, no headaches and no shortness of breath.       Joseph Pruitt is a 58 y.o. male who presents to the Emergency Department complaining of left elbow and proximal forearm pain.  Symptoms for 3 weeks.  Pain associated with rotation and movement, mainly flexion of the elbow.  Denies known injury and states he did not hear or feel a pop of his elbow.  No pain to his bicep or shoulder.  He denies numbness or weakness of his left hand.  He is right hand dominant.   He also requesting evaluation of sinus congestion, sneezing and sinus pressure.  Symptoms for week.  No fever, cough, chest pain, shortness of breath.  He has used over-the-counter allergy medications without relief.  Home Medications Prior to Admission medications   Medication Sig Start Date End Date Taking? Authorizing Provider  aspirin  81 MG EC tablet Take 1 tablet by mouth daily. 06/10/16   [provider]  colchicine  0.6 MG tablet Take two tablets for first dose, then one tablet one hour later. You may repeat this dosing after 3 days if needed 08/31/22   Dorenda Gandy, MD  cyclobenzaprine  (FLEXERIL ) 10 MG tablet Take 1 tablet (10 mg total) by mouth 2 (two) times daily as needed for muscle spasms. Patient not taking: Reported on 01/16/2022 05/21/21   Volney Grumbles, PA-C  fluticasone  (FLONASE ) 50 MCG/ACT nasal spray Place 1 spray into both nostrils daily. 10/05/14 05/01/21  [provider]  fluticasone  (FLONASE ) 50 MCG/ACT nasal spray Place 1 spray into both nostrils daily. 06/29/21   Horton, Vonzella Guernsey, MD  hydrochlorothiazide (HYDRODIURIL) 25 MG tablet Take 25 mg by mouth daily.    [provider]  hydrOXYzine  (ATARAX ) 25  MG tablet Take 1 tablet (25 mg total) by mouth every 6 (six) hours as needed for itching. Patient not taking: Reported on 01/16/2022 09/06/21   Zammit, Joseph, MD  ibuprofen  (ADVIL ) 800 MG tablet Take 1 tablet (800 mg total) by mouth every 8 (eight) hours as needed for moderate pain (pain score 4-6). 12/24/22   Darrin Emerald, MD  loratadine  (CLARITIN ) 10 MG tablet Take 1 tablet (10 mg total) by mouth daily. Patient not taking: Reported on 01/16/2022 06/29/21   Horton, Vonzella Guernsey, MD  metFORMIN  (GLUCOPHAGE ) 500 MG tablet Take 1 tablet (500 mg total) by mouth 2 (two) times daily with a meal. 06/16/22   Cheyenne Cotta, MD  naproxen  (NAPROSYN ) 375 MG tablet Take 1 tablet (375 mg total) by mouth 2 (two) times daily. 12/18/22   Baxter Limber A, PA-C  oxyCODONE -acetaminophen  (PERCOCET/ROXICET) 5-325 MG tablet Take 1 tablet by mouth every 6 (six) hours as needed for moderate pain or severe pain.  11/22/14   [provider]      Allergies    Patient has no known allergies.    Review of Systems   Review of Systems  Constitutional:  Negative for chills and fever.  HENT:  Positive for congestion, rhinorrhea, sinus pressure and sneezing. Negative for sore throat and trouble swallowing.   Respiratory:  Negative for cough and shortness of breath.   Cardiovascular:  Negative for chest pain.  Musculoskeletal:  Positive for arthralgias (Left elbow pain). Negative for joint swelling and neck pain.  Skin:  Negative for rash and wound.  Neurological:  Negative for dizziness, weakness, numbness and headaches.    Physical Exam Updated Vital Signs BP 123/89 (BP Location: Right Arm)   Pulse 69   Temp 97.7 F (36.5 C) (Temporal)   Resp 16   Ht 6\' 1"  (1.854 m)   Wt 112.9 kg   SpO2 98%   BMI 32.85 kg/m  Physical Exam Vitals and nursing note reviewed.  Constitutional:      General: He is not in acute distress.    Appearance: Normal appearance. He is not ill-appearing or toxic-appearing.  HENT:      Right Ear: Tympanic membrane and ear canal normal.     Left Ear: Tympanic membrane and ear canal normal.     Mouth/Throat:     Mouth: Mucous membranes are moist.     Pharynx: Oropharynx is clear. No oropharyngeal exudate or posterior oropharyngeal erythema.  Eyes:     Conjunctiva/sclera: Conjunctivae normal.     Pupils: Pupils are equal, round, and reactive to light.  Cardiovascular:     Rate and Rhythm: Normal rate and regular rhythm.     Pulses: Normal pulses.  Pulmonary:     Effort: Pulmonary effort is normal.     Breath sounds: Normal breath sounds.  Musculoskeletal:        General: Tenderness present. No swelling, deformity or signs of injury.     Left elbow: No swelling or effusion. Tenderness present in lateral epicondyle. No olecranon process tenderness.     Cervical back: Normal range of motion.     Comments: Ttp of the left antecubital fossa and lateral epicondyle area.  No edema or erythema.  No step off deformity of the [facility]  Skin:    Capillary Refill: Capillary refill takes less than 2 seconds.  Neurological:     General: No focal deficit present.     Mental Status: He is alert.     Sensory: No sensory deficit.     Motor: No weakness.     ED Results / Procedures / Treatments   Labs (all labs ordered are listed, but only abnormal results are displayed) Labs Reviewed  RESP PANEL BY RT-PCR (RSV, FLU A&B, COVID)  RVPGX2    EKG None  Radiology DG Elbow Complete Left Result Date: 07/01/2023 CLINICAL DATA:  Pain for 3 weeks EXAM: LEFT ELBOW - COMPLETE 4 VIEW COMPARISON:  None Available. FINDINGS: No fracture or dislocation. Preserved joint spaces and bone mineralization. No joint effusion on lateral view. Hyperostosis. IMPRESSION: No acute osseous abnormality. Electronically Signed   By: Adrianna Horde M.D.   On: 07/01/2023 14:10    Procedures Procedures    Medications Ordered in ED Medications - No data to display  ED Course/ Medical Decision Making/ A&P                                  Medical Decision Making Patient here with 3-week history of left elbow pain pain mostly with rotation and flexion.  No edema erythema or excessive warmth of the joint.  No known injury.  Bicep tendon appears intact on exam.  Extremity neurovascularly intact.  He is also requesting evaluation for rhinorrhea, sneezing sinus pressure and congestion.  History of allergies no relief with over-the-counter allergy medications.  Patient well-appearing nontoxic.  Vital signs reassuring.  Extremity  is neurovascularly intact.  I do not appreciate any symptoms for septic joint.  Low clinical suspicion for bicep tendon rupture.  I suspect this is related to a inflammatory tendinitis  Amount and/or Complexity of Data Reviewed Labs: ordered.    Details: Respiratory panel negative Radiology: ordered.    Details: X-ray of the elbow negative Discussion of management or test interpretation with external provider(s): Patient with likely tendinitis of the elbow.  Agreeable to symptomatic treatment rest ice and NSAID.  Will prescribe steroid nasal spray and Zyrtec  for his allergy symptoms.           Final Clinical Impression(s) / ED Diagnoses Final diagnoses:  Tendonitis of elbow, left  Acute sinusitis, recurrence not specified, unspecified location    Rx / DC Orders ED Discharge Orders     None         Catherne Clubs, PA-C 07/01/23 1518    Mordecai Applebaum, MD 07/04/23 [phone]

## 2023-07-01 NOTE — ED Provider Triage Note (Signed)
 Emergency Medicine Provider Triage Evaluation Note  KAIDIN BOEHLE , a 58 y.o. male  was evaluated in triage.  Pt complains of left elbow pain and sinus and allergy symptoms. Denies known injury of the elbow and swelling. No numbness of hand or fingers.  He has tried OTC allergy medication w/o relief.  No fever  Review of Systems  Positive: Elbow pain, sinus pressure, sneezing, nasal congestion Negative: Cough, fever, headache  Physical Exam  BP 123/89 (BP Location: Right Arm)   Pulse 69   Temp 97.7 F (36.5 C) (Temporal)   Resp 16   Ht 6\' 1"  (1.854 m)   Wt 112.9 kg   SpO2 98%   BMI 32.85 kg/m  Gen:   Awake, no distress   Resp:  Normal effort  MSK:   Moves extremities without difficulty  Other:  Left elbow pain on rom Medical Decision Making  Medically screening exam initiated at 1:27 PM.  Appropriate orders placed.  Angie Bari was informed that the remainder of the evaluation will be completed by another provider, this initial triage assessment does not replace that evaluation, and the importance of remaining in the ED until their evaluation is complete.     Catherne Clubs, PA-C 07/02/23 2141

## 2023-07-01 NOTE — ED Triage Notes (Signed)
 Pt arrived via POV c/o left elbow/arm pain X 3 weeks and right arm pain. Pt also reports his seasonal allergies are affecting him and he is requesting medication to help with his symptoms as well.

## 2023-07-01 NOTE — ED Notes (Signed)
 Patient discharged. Provider spoke to patient. Paperwork given to patient and reviewed. Pt verbalized understanding. VSS. A+Ox4. Patient ambulated out of the ER with steady, independent gait. No iv in place.

## 2023-10-11 ENCOUNTER — Other Ambulatory Visit: Payer: Self-pay

## 2023-10-11 ENCOUNTER — Encounter (HOSPITAL_COMMUNITY): Payer: Self-pay

## 2023-10-11 ENCOUNTER — Emergency Department (HOSPITAL_COMMUNITY)
Admission: EM | Admit: 2023-10-11 | Discharge: 2023-10-11 | Disposition: A | Discharge status: Home / Self Care | Attending: Emergency Medicine | Admitting: Emergency Medicine

## 2023-10-11 DIAGNOSIS — L247 Irritant contact dermatitis due to plants, except food: Secondary | ICD-10-CM | POA: Diagnosis not present

## 2023-10-11 DIAGNOSIS — Z79899 Other long term (current) drug therapy: Secondary | ICD-10-CM | POA: Insufficient documentation

## 2023-10-11 DIAGNOSIS — E119 Type 2 diabetes mellitus without complications: Secondary | ICD-10-CM | POA: Diagnosis not present

## 2023-10-11 DIAGNOSIS — Z7982 Long term (current) use of aspirin: Secondary | ICD-10-CM | POA: Insufficient documentation

## 2023-10-11 DIAGNOSIS — Z7984 Long term (current) use of oral hypoglycemic drugs: Secondary | ICD-10-CM | POA: Diagnosis not present

## 2023-10-11 DIAGNOSIS — I10 Essential (primary) hypertension: Secondary | ICD-10-CM | POA: Diagnosis not present

## 2023-10-11 DIAGNOSIS — R21 Rash and other nonspecific skin eruption: Secondary | ICD-10-CM | POA: Diagnosis present

## 2023-10-11 LAB — CBG MONITORING, ED: Glucose-Capillary: 104 mg/dL — ABNORMAL HIGH (ref 70–99)

## 2023-10-11 MED ORDER — DEXAMETHASONE SODIUM PHOSPHATE 10 MG/ML IJ SOLN
10.0000 mg | Freq: Once | INTRAMUSCULAR | Status: AC
Start: 2023-10-11 — End: 2023-10-11
  Administered 2023-10-11: 10 mg via INTRAMUSCULAR
  Filled 2023-10-11: qty 1

## 2023-10-11 MED ORDER — PREDNISONE 10 MG PO TABS: ORAL_TABLET | ORAL | 0 refills | Status: AC

## 2023-10-11 NOTE — Discharge Instructions (Signed)
 You have been prescribed a tapering dose of prednisone  which should knock out this poison ivy completely.  I also recommend Benadryl , no prescription needed which may also help with itching.  An excellent itch cream is called Goldbond anti-itch cream or generic equivalent which you can find at [facility] which is less expensive than the brand name.  Get rechecked for any persistent or worsening symptoms.

## 2023-10-11 NOTE — ED Triage Notes (Signed)
 Pt has poison oak and wants a steroid shot. Stated he has been here other times for the same

## 2023-10-12 NOTE — ED Provider Notes (Signed)
 [place] EMERGENCY DEPARTMENT AT [facility] Provider Note   CSN: [id number] Arrival date & time: 10/11/23  1726     Patient presents with: Rash   Joseph Pruitt is a 58 y.o. male with a history including hypertension, type 2 diabetes, PUD, presenting for evaluation of a rash which he knows to be poison oak with known exposure.  He has an itchy rash mostly on his forearms which have not been relieved by OTC medications.  He has had steroid shots in the past but tended to clear this rash up overnight and is desirous of a steroid shot at this time.  He denies any other complaints, no fevers or chills, no shortness of breath.  He does not routinely check his blood glucose levels but is compliant with his metformin .   The history is provided by the patient.       Prior to Admission medications   Medication Sig Start Date End Date Taking? Authorizing Provider  predniSONE  (DELTASONE ) 10 MG tablet Take 5 tablets by mouth on day one,  4 tablets day two,  3 tabs day three, 2 tabs on day four then 1 tablet on day five. 10/11/23  Yes IdolMliss, PA-C  aspirin  81 MG EC tablet Take 1 tablet by mouth daily. 06/10/16   [provider]  cetirizine  (ZYRTEC  ALLERGY) 10 MG tablet Take 1 tablet (10 mg total) by mouth daily. 07/01/23   Triplett, Tammy, PA-C  colchicine  0.6 MG tablet Take two tablets for first dose, then one tablet one hour later. You may repeat this dosing after 3 days if needed 08/31/22   Garrick Lamar, MD  cyclobenzaprine  (FLEXERIL ) 10 MG tablet Take 1 tablet (10 mg total) by mouth 2 (two) times daily as needed for muscle spasms. Patient not taking: Reported on 01/16/2022 05/21/21   Waylan Elsie PARAS, PA-C  fluticasone  (FLONASE ) 50 MCG/ACT nasal spray Place 2 sprays into both nostrils daily. 07/01/23   Triplett, Tammy, PA-C  hydrochlorothiazide (HYDRODIURIL) 25 MG tablet Take 25 mg by mouth daily.    [provider]  hydrOXYzine  (ATARAX ) 25 MG tablet Take 1 tablet  (25 mg total) by mouth every 6 (six) hours as needed for itching. Patient not taking: Reported on 01/16/2022 09/06/21   Zammit, Joseph, MD  ibuprofen  (ADVIL ) 800 MG tablet Take 1 tablet (800 mg total) by mouth 3 (three) times daily. Take with food 07/01/23   Triplett, Tammy, PA-C  metFORMIN  (GLUCOPHAGE ) 500 MG tablet Take 1 tablet (500 mg total) by mouth 2 (two) times daily with a meal. 06/16/22   Suzette Pac, MD  oxyCODONE -acetaminophen  (PERCOCET/ROXICET) 5-325 MG tablet Take 1 tablet by mouth every 6 (six) hours as needed for moderate pain or severe pain.  11/22/14   [provider]    Allergies: Patient has no known allergies.    Review of Systems  Constitutional:  Negative for fever.  HENT:  Negative for congestion.   Eyes: Negative.   Respiratory:  Negative for chest tightness, shortness of breath and wheezing.   Cardiovascular:  Negative for chest pain.  Gastrointestinal:  Negative for abdominal pain and nausea.  Genitourinary: Negative.   Musculoskeletal:  Negative for arthralgias, joint swelling and neck pain.  Skin:  Positive for rash. Negative for wound.  Allergic/Immunologic: Positive for environmental allergies.  Neurological:  Negative for dizziness, weakness, light-headedness, numbness and headaches.  Psychiatric/Behavioral: Negative.      Updated Vital Signs BP 127/80 (BP Location: Right Arm)   Pulse 68  Temp 98.6 F (37 C) (Oral)   Resp 16   Ht 6' 1 (1.854 m)   Wt 117.9 kg   SpO2 95%   BMI 34.30 kg/m   Physical Exam Constitutional:      General: He is not in acute distress.    Appearance: He is well-developed.  HENT:     Head: Normocephalic.  Cardiovascular:     Rate and Rhythm: Normal rate.  Pulmonary:     Effort: Pulmonary effort is normal.     Breath sounds: No wheezing.  Musculoskeletal:        General: Normal range of motion.     Cervical back: Neck supple.  Skin:    Findings: Rash present. Rash is vesicular.     Comments: Near  confluent tiny vesicular rash bilateral forearms.  No drainage, no erythema, no purulence or other evidence for infection.     (all labs ordered are listed, but only abnormal results are displayed) Labs Reviewed  CBG MONITORING, ED - Abnormal; Notable for the following components:      Result Value   Glucose-Capillary 104 (*)    All other components within normal limits    EKG: None  Radiology: No results found.   Procedures   Medications Ordered in the ED  dexamethasone  (DECADRON ) injection 10 mg (10 mg Intramuscular Given 10/11/23 1809)                                    Medical Decision Making Patient presenting with simple rash consistent with poison oak exposure.  His CBG is normal today.  He was given an IM dose of Decadron  and a short prednisone  taper for home use.  Advised to check his blood glucose levels for the next several days to ensure they do not bump.  We also discussed other home treatments for helping to relieve the itching.  Parent follow-up anticipated.  Amount and/or Complexity of Data Reviewed Labs: ordered.    Details: CBG 104  Risk Prescription drug management.        Final diagnoses:  Irritant contact dermatitis due to plants, except food    ED Discharge Orders          Ordered    predniSONE  (DELTASONE ) 10 MG tablet        10/11/23 1820               Birdena Clarity, PA-C 10/12/23 1856    Cleotilde Rogue, MD 10/16/23 1721

## 2024-02-07 ENCOUNTER — Emergency Department (HOSPITAL_COMMUNITY)

## 2024-02-07 ENCOUNTER — Emergency Department (HOSPITAL_COMMUNITY)
Admission: EM | Admit: 2024-02-07 | Discharge: 2024-02-07 | Disposition: A | Discharge status: Home / Self Care | Attending: Emergency Medicine | Admitting: Emergency Medicine

## 2024-02-07 ENCOUNTER — Other Ambulatory Visit: Payer: Self-pay

## 2024-02-07 ENCOUNTER — Encounter (HOSPITAL_COMMUNITY): Payer: Self-pay

## 2024-02-07 DIAGNOSIS — Z7982 Long term (current) use of aspirin: Secondary | ICD-10-CM | POA: Insufficient documentation

## 2024-02-07 DIAGNOSIS — R059 Cough, unspecified: Secondary | ICD-10-CM | POA: Insufficient documentation

## 2024-02-07 DIAGNOSIS — J069 Acute upper respiratory infection, unspecified: Secondary | ICD-10-CM | POA: Insufficient documentation

## 2024-02-07 LAB — RESP PANEL BY RT-PCR (RSV, FLU A&B, COVID)  RVPGX2
Influenza A by PCR: NEGATIVE
Influenza B by PCR: NEGATIVE
Resp Syncytial Virus by PCR: NEGATIVE
SARS Coronavirus 2 by RT PCR: NEGATIVE

## 2024-02-07 LAB — GROUP A STREP BY PCR: Group A Strep by PCR: NOT DETECTED

## 2024-02-07 MED ORDER — IPRATROPIUM-ALBUTEROL 0.5-2.5 (3) MG/3ML IN SOLN
3.0000 mL | Freq: Once | RESPIRATORY_TRACT | Status: AC
  Administered 2024-02-07: 3 mL via RESPIRATORY_TRACT
  Filled 2024-02-07: qty 3

## 2024-02-07 MED ORDER — BENZONATATE 100 MG PO CAPS
200.0000 mg | ORAL_CAPSULE | Freq: Once | ORAL | Status: AC
  Administered 2024-02-07: 200 mg via ORAL
  Filled 2024-02-07: qty 2

## 2024-02-07 MED ORDER — ALBUTEROL SULFATE HFA 108 (90 BASE) MCG/ACT IN AERS: INHALATION_SPRAY | RESPIRATORY_TRACT | 0 refills | Status: AC

## 2024-02-07 MED ORDER — DOXYCYCLINE HYCLATE 100 MG PO CAPS: 100.0000 mg | ORAL_CAPSULE | Freq: Two times a day (BID) | ORAL | 0 refills | Status: AC

## 2024-02-07 MED ORDER — BENZONATATE 200 MG PO CAPS: 200.0000 mg | ORAL_CAPSULE | Freq: Three times a day (TID) | ORAL | 0 refills | Status: AC | PRN

## 2024-02-07 NOTE — Discharge Instructions (Signed)
 You may take Tylenol  if needed for body aches and/or fever.  Use the albuterol inhaler, 1 to 2 puffs every 4-6 hours as needed.  Take the antibiotic as directed until finished.  Please follow-up with your primary care provider for recheck or return to the emergency department if you develop any new or worsening symptoms

## 2024-02-07 NOTE — ED Provider Notes (Signed)
 [place] EMERGENCY DEPARTMENT AT [facility] Provider Note   CSN: [id number] Arrival date & time: 02/07/24  9096     Patient presents with: Sore Throat   Joseph Pruitt is a 58 y.o. male.    Sore Throat Pertinent negatives include no chest pain.        Joseph Pruitt is a 59 y.o. male past medical history of prediabetes, hypertension, colon cancer who presents to the Emergency Department complaining of cough sore throat, chest tightness.  Symptoms present for 2 days.  He is is also noticed some wheezing primarily at night.  Cough productive of thick sputum.  No fever or chills abdominal pain or chest pain.  No shortness of breath.  He has been using over-the-counter TheraFlu without relief.  Denies any peripheral edema.  No known sick contacts.   Prior to Admission medications   Medication Sig Start Date End Date Taking? Authorizing Provider  aspirin  81 MG EC tablet Take 1 tablet by mouth daily. 06/10/16   [provider]  cetirizine  (ZYRTEC  ALLERGY) 10 MG tablet Take 1 tablet (10 mg total) by mouth daily. 07/01/23   Abdishakur Gottschall, PA-C  colchicine  0.6 MG tablet Take two tablets for first dose, then one tablet one hour later. You may repeat this dosing after 3 days if needed 08/31/22   Garrick Lamar, MD  cyclobenzaprine  (FLEXERIL ) 10 MG tablet Take 1 tablet (10 mg total) by mouth 2 (two) times daily as needed for muscle spasms. Patient not taking: Reported on 01/16/2022 05/21/21   Waylan Elsie PARAS, PA-C  fluticasone  (FLONASE ) 50 MCG/ACT nasal spray Place 2 sprays into both nostrils daily. 07/01/23   Kasidee Voisin, PA-C  hydrochlorothiazide (HYDRODIURIL) 25 MG tablet Take 25 mg by mouth daily.    [provider]  hydrOXYzine  (ATARAX ) 25 MG tablet Take 1 tablet (25 mg total) by mouth every 6 (six) hours as needed for itching. Patient not taking: Reported on 01/16/2022 09/06/21   Zammit, Joseph, MD  ibuprofen  (ADVIL ) 800 MG tablet Take 1 tablet (800 mg  total) by mouth 3 (three) times daily. Take with food 07/01/23   Wasim Hurlbut, PA-C  metFORMIN  (GLUCOPHAGE ) 500 MG tablet Take 1 tablet (500 mg total) by mouth 2 (two) times daily with a meal. 06/16/22   Suzette Pac, MD  oxyCODONE -acetaminophen  (PERCOCET/ROXICET) 5-325 MG tablet Take 1 tablet by mouth every 6 (six) hours as needed for moderate pain or severe pain.  11/22/14   [provider]  predniSONE  (DELTASONE ) 10 MG tablet Take 5 tablets by mouth on day one,  4 tablets day two,  3 tabs day three, 2 tabs on day four then 1 tablet on day five. 10/11/23   Idol, Julie, PA-C    Allergies: Patient has no known allergies.    Review of Systems  HENT:  Positive for sore throat.   Respiratory:  Positive for cough, chest tightness and wheezing.   Cardiovascular:  Negative for chest pain.  All other systems reviewed and are negative.   Updated Vital Signs BP 126/84 (BP Location: Left Arm)   Pulse 86   Temp 99.1 F (37.3 C) (Oral)   Resp 20   Ht 6' 1 (1.854 m)   Wt 116.1 kg   SpO2 95%   BMI 33.78 kg/m   Physical Exam Vitals and nursing note reviewed.  Constitutional:      General: He is not in acute distress.    Appearance: Normal appearance. He is well-developed. He is not  toxic-appearing.  HENT:     Right Ear: Tympanic membrane and ear canal normal.     Left Ear: Tympanic membrane and ear canal normal.     Mouth/Throat:     Mouth: Mucous membranes are moist.     Pharynx: Posterior oropharyngeal erythema present.     Comments: Erythema of the oropharynx without edema.  No exudate.  Uvula midline nonedematous Eyes:     Extraocular Movements: Extraocular movements intact.     Conjunctiva/sclera: Conjunctivae normal.  Cardiovascular:     Rate and Rhythm: Normal rate and regular rhythm.     Pulses: Normal pulses.  Pulmonary:     Effort: Pulmonary effort is normal.     Breath sounds: Wheezing present.     Comments: Scattered expiratory wheezes.  No rales or rhonchi.  No  increased work of breathing on exam. Abdominal:     Palpations: Abdomen is soft.     Tenderness: There is no abdominal tenderness.  Musculoskeletal:        General: Normal range of motion.     Cervical back: Normal range of motion.  Lymphadenopathy:     Cervical: No cervical adenopathy.  Skin:    General: Skin is warm.     Capillary Refill: Capillary refill takes less than 2 seconds.  Neurological:     General: No focal deficit present.     Mental Status: He is alert.     Sensory: No sensory deficit.     Motor: No weakness.     (all labs ordered are listed, but only abnormal results are displayed) Labs Reviewed  GROUP A STREP BY PCR  RESP PANEL BY RT-PCR (RSV, FLU A&B, COVID)  RVPGX2    EKG: None  Radiology: DG Chest 2 View Result Date: 02/07/2024 CLINICAL DATA:  Sore throat, cough and congestion. EXAM: CHEST - 2 VIEW COMPARISON:  12/25/2021. FINDINGS: Cardiac silhouette is normal in size and configuration. No mediastinal or hilar masses. No evidence of adenopathy. Clear lungs.  No pleural effusion or pneumothorax. Skeletal structures are intact. IMPRESSION: No active cardiopulmonary disease. Electronically Signed   By: Alm Parkins M.D.   On: 02/07/2024 10:43     Procedures   Medications Ordered in the ED  ipratropium-albuterol (DUONEB) 0.5-2.5 (3) MG/3ML nebulizer solution 3 mL (3 mLs Nebulization Given 02/07/24 1117)  benzonatate  (TESSALON ) capsule 200 mg (200 mg Oral Given 02/07/24 1027)                                    Medical Decision Making   Patient here from home for evaluation of cough, wheezing, and sore throat.  Symptoms present for 2 days.  No reported fever.  Cough is productive of thick sputum, he endorses some chest tightness that began after excessive coughing.  No shortness of breath.  On my exam, patient well-appearing nontoxic.  He has reassuring vital signs without tachycardia tachypnea or hypoxia.  I suspect this is viral process.  PE, ACS  pneumonia considered but felt less likely.  Amount and/or Complexity of Data Reviewed Labs: ordered.    Details: Respiratory panel negative, strep negative  Radiology: ordered.    Details: Chest x-ray without acute cardiopulmonary process Discussion of management or test interpretation with external provider(s):   On recheck after neb treatment, lung sounds have improved.  No hypoxia here.  Cough has improved after Tessalon .  Suspect viral process.  Has upcoming PCP appointment.  No indication for labs at this time.  Very nontoxic-appearing.  Will treat with albuterol MDI, Tessalon  for his cough and given his comorbidities, will treat with antibiotics.  Return precautions were given  Risk Prescription drug management.        Final diagnoses:  Viral URI with cough    ED Discharge Orders     None          Herlinda Milling, PA-C 02/07/24 1218    Suzette Pac, MD 02/09/24 1039

## 2024-02-07 NOTE — ED Triage Notes (Signed)
 Pt has had sore throat roughly 2 days. Took theraflu at home with no relief. States he's been coughing up phlegm as well.
